# Patient Record
Sex: Female | Born: 1985 | Race: Black or African American | Hispanic: No | Marital: Single | Smoking: Current every day smoker
Health system: Southern US, Community
[De-identification: ages and names within clinical notes are randomized; demographics above are authoritative.]

## PROBLEM LIST (undated history)

## (undated) ENCOUNTER — Inpatient Hospital Stay (HOSPITAL_COMMUNITY): Payer: Self-pay

## (undated) DIAGNOSIS — B999 Unspecified infectious disease: Secondary | ICD-10-CM

## (undated) DIAGNOSIS — I10 Essential (primary) hypertension: Secondary | ICD-10-CM

## (undated) DIAGNOSIS — L309 Dermatitis, unspecified: Secondary | ICD-10-CM

## (undated) DIAGNOSIS — E611 Iron deficiency: Secondary | ICD-10-CM

## (undated) DIAGNOSIS — N83209 Unspecified ovarian cyst, unspecified side: Secondary | ICD-10-CM

## (undated) HISTORY — PX: INDUCED ABORTION: SHX677

## (undated) HISTORY — PX: WISDOM TOOTH EXTRACTION: SHX21

---
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
## 2006-02-04 ENCOUNTER — Ambulatory Visit (HOSPITAL_COMMUNITY): Admission: RE | Admit: 2006-02-04 | Discharge: 2006-02-04 | Payer: Self-pay | Admitting: Obstetrics

## 2006-03-04 ENCOUNTER — Ambulatory Visit (HOSPITAL_COMMUNITY): Admission: RE | Admit: 2006-03-04 | Discharge: 2006-03-04 | Payer: Self-pay | Admitting: Obstetrics

## 2006-03-19 ENCOUNTER — Inpatient Hospital Stay (HOSPITAL_COMMUNITY): Admission: AD | Admit: 2006-03-19 | Discharge: 2006-03-19 | Payer: Self-pay | Admitting: Obstetrics

## 2006-04-01 ENCOUNTER — Ambulatory Visit (HOSPITAL_COMMUNITY): Admission: RE | Admit: 2006-04-01 | Discharge: 2006-04-01 | Payer: Self-pay | Admitting: Obstetrics

## 2006-04-30 ENCOUNTER — Ambulatory Visit (HOSPITAL_COMMUNITY): Admission: RE | Admit: 2006-04-30 | Discharge: 2006-04-30 | Payer: Self-pay | Admitting: Obstetrics

## 2006-05-10 ENCOUNTER — Inpatient Hospital Stay (HOSPITAL_COMMUNITY): Admission: AD | Admit: 2006-05-10 | Discharge: 2006-05-10 | Payer: Self-pay | Admitting: Obstetrics

## 2006-05-11 ENCOUNTER — Inpatient Hospital Stay (HOSPITAL_COMMUNITY): Admission: AD | Admit: 2006-05-11 | Discharge: 2006-05-11 | Payer: Self-pay | Admitting: Family Medicine

## 2006-05-21 ENCOUNTER — Ambulatory Visit (HOSPITAL_COMMUNITY): Admission: RE | Admit: 2006-05-21 | Discharge: 2006-05-21 | Payer: Self-pay | Admitting: Obstetrics

## 2006-06-02 ENCOUNTER — Inpatient Hospital Stay (HOSPITAL_COMMUNITY): Admission: AD | Admit: 2006-06-02 | Discharge: 2006-06-02 | Payer: Self-pay | Admitting: Obstetrics

## 2006-06-03 ENCOUNTER — Inpatient Hospital Stay (HOSPITAL_COMMUNITY): Admission: AD | Admit: 2006-06-03 | Discharge: 2006-06-06 | Payer: Self-pay | Admitting: Obstetrics

## 2006-06-03 ENCOUNTER — Encounter (INDEPENDENT_AMBULATORY_CARE_PROVIDER_SITE_OTHER): Payer: Self-pay | Admitting: Obstetrics

## 2006-06-08 ENCOUNTER — Inpatient Hospital Stay (HOSPITAL_COMMUNITY): Admission: AD | Admit: 2006-06-08 | Discharge: 2006-06-10 | Payer: Self-pay | Admitting: Obstetrics

## 2006-10-12 ENCOUNTER — Emergency Department (HOSPITAL_COMMUNITY): Admission: EM | Admit: 2006-10-12 | Discharge: 2006-10-12 | Payer: Self-pay | Admitting: Emergency Medicine

## 2007-03-16 ENCOUNTER — Emergency Department (HOSPITAL_COMMUNITY): Admission: EM | Admit: 2007-03-16 | Discharge: 2007-03-16 | Payer: Self-pay | Admitting: Family Medicine

## 2007-11-03 ENCOUNTER — Emergency Department (HOSPITAL_COMMUNITY): Admission: EM | Admit: 2007-11-03 | Discharge: 2007-11-03 | Payer: Self-pay | Admitting: Emergency Medicine

## 2008-01-30 ENCOUNTER — Emergency Department (HOSPITAL_COMMUNITY): Admission: EM | Admit: 2008-01-30 | Discharge: 2008-01-30 | Payer: Self-pay | Admitting: Emergency Medicine

## 2008-12-16 ENCOUNTER — Inpatient Hospital Stay (HOSPITAL_COMMUNITY): Admission: AD | Admit: 2008-12-16 | Discharge: 2008-12-16 | Payer: Self-pay | Admitting: Obstetrics

## 2008-12-25 ENCOUNTER — Inpatient Hospital Stay (HOSPITAL_COMMUNITY): Admission: AD | Admit: 2008-12-25 | Discharge: 2008-12-25 | Payer: Self-pay | Admitting: Obstetrics

## 2009-02-01 ENCOUNTER — Emergency Department (HOSPITAL_COMMUNITY): Admission: EM | Admit: 2009-02-01 | Discharge: 2009-02-01 | Payer: Self-pay | Admitting: Emergency Medicine

## 2009-03-14 ENCOUNTER — Emergency Department (HOSPITAL_COMMUNITY): Admission: EM | Admit: 2009-03-14 | Discharge: 2009-03-14 | Payer: Self-pay | Admitting: Emergency Medicine

## 2009-03-15 ENCOUNTER — Inpatient Hospital Stay (HOSPITAL_COMMUNITY): Admission: AD | Admit: 2009-03-15 | Discharge: 2009-03-15 | Payer: Self-pay | Admitting: Obstetrics and Gynecology

## 2009-07-04 ENCOUNTER — Ambulatory Visit: Payer: Self-pay | Admitting: Gynecology

## 2009-07-04 ENCOUNTER — Inpatient Hospital Stay (HOSPITAL_COMMUNITY): Admission: AD | Admit: 2009-07-04 | Discharge: 2009-07-04 | Payer: Self-pay | Admitting: Obstetrics

## 2009-07-12 ENCOUNTER — Ambulatory Visit (HOSPITAL_COMMUNITY): Admission: RE | Admit: 2009-07-12 | Discharge: 2009-07-12 | Payer: Self-pay | Admitting: Obstetrics

## 2009-07-14 ENCOUNTER — Inpatient Hospital Stay (HOSPITAL_COMMUNITY): Admission: AD | Admit: 2009-07-14 | Discharge: 2009-07-14 | Payer: Self-pay | Admitting: Obstetrics

## 2009-07-14 ENCOUNTER — Ambulatory Visit: Payer: Self-pay | Admitting: Nurse Practitioner

## 2009-11-08 ENCOUNTER — Inpatient Hospital Stay (HOSPITAL_COMMUNITY): Admission: AD | Admit: 2009-11-08 | Discharge: 2009-11-08 | Payer: Self-pay | Admitting: Obstetrics

## 2009-11-08 ENCOUNTER — Ambulatory Visit: Payer: Self-pay | Admitting: Obstetrics and Gynecology

## 2010-01-27 ENCOUNTER — Encounter: Payer: Self-pay | Admitting: Obstetrics

## 2010-03-04 ENCOUNTER — Encounter (HOSPITAL_COMMUNITY): Payer: Self-pay | Admitting: Radiology

## 2010-03-04 ENCOUNTER — Inpatient Hospital Stay (HOSPITAL_COMMUNITY): Payer: Medicaid Other

## 2010-03-04 ENCOUNTER — Other Ambulatory Visit: Payer: Self-pay | Admitting: Family Medicine

## 2010-03-04 ENCOUNTER — Inpatient Hospital Stay (HOSPITAL_COMMUNITY)
Admission: AD | Admit: 2010-03-04 | Discharge: 2010-03-04 | Disposition: A | Discharge status: Home / Self Care | Payer: Medicaid Other | Source: Ambulatory Visit | Attending: Obstetrics | Admitting: Obstetrics

## 2010-03-04 DIAGNOSIS — O209 Hemorrhage in early pregnancy, unspecified: Secondary | ICD-10-CM | POA: Insufficient documentation

## 2010-03-04 DIAGNOSIS — R58 Hemorrhage, not elsewhere classified: Secondary | ICD-10-CM

## 2010-03-04 DIAGNOSIS — A499 Bacterial infection, unspecified: Secondary | ICD-10-CM | POA: Insufficient documentation

## 2010-03-04 DIAGNOSIS — B9689 Other specified bacterial agents as the cause of diseases classified elsewhere: Secondary | ICD-10-CM | POA: Insufficient documentation

## 2010-03-04 DIAGNOSIS — N390 Urinary tract infection, site not specified: Secondary | ICD-10-CM | POA: Insufficient documentation

## 2010-03-04 DIAGNOSIS — N760 Acute vaginitis: Secondary | ICD-10-CM | POA: Insufficient documentation

## 2010-03-04 DIAGNOSIS — R52 Pain, unspecified: Secondary | ICD-10-CM

## 2010-03-04 DIAGNOSIS — O2390 Unspecified genitourinary tract infection in pregnancy, unspecified trimester: Secondary | ICD-10-CM | POA: Insufficient documentation

## 2010-03-04 LAB — URINALYSIS, ROUTINE W REFLEX MICROSCOPIC
Bilirubin Urine: NEGATIVE
Hgb urine dipstick: NEGATIVE
Ketones, ur: NEGATIVE mg/dL
Nitrite: NEGATIVE
Protein, ur: NEGATIVE mg/dL
Specific Gravity, Urine: 1.015 (ref 1.005–1.030)
Urine Glucose, Fasting: NEGATIVE mg/dL
Urobilinogen, UA: 0.2 mg/dL (ref 0.0–1.0)
pH: 7 (ref 5.0–8.0)

## 2010-03-04 LAB — URINE MICROSCOPIC-ADD ON

## 2010-03-04 LAB — WET PREP, GENITAL
Trich, Wet Prep: NONE SEEN
Yeast Wet Prep HPF POC: NONE SEEN

## 2010-03-04 LAB — HCG, QUANTITATIVE, PREGNANCY: hCG, Beta Chain, Quant, S: 5788 m[IU]/mL — ABNORMAL HIGH (ref <5)

## 2010-03-04 LAB — CBC
HCT: 31.9 % — ABNORMAL LOW (ref 36.0–46.0)
Hemoglobin: 10.4 g/dL — ABNORMAL LOW (ref 12.0–15.0)
MCH: 30.2 pg (ref 26.0–34.0)
MCHC: 32.6 g/dL (ref 30.0–36.0)
MCV: 92.7 fL (ref 78.0–100.0)
Platelets: 248 10*3/uL (ref 150–400)
RBC: 3.44 MIL/uL — ABNORMAL LOW (ref 3.87–5.11)
RDW: 13.5 % (ref 11.5–15.5)
WBC: 5.4 10*3/uL (ref 4.0–10.5)

## 2010-03-04 LAB — POCT PREGNANCY, URINE: Preg Test, Ur: POSITIVE

## 2010-03-04 LAB — ABO/RH: ABO/RH(D): O POS

## 2010-03-05 LAB — URINE CULTURE
Colony Count: NO GROWTH
Culture  Setup Time: 201202280143
Culture: NO GROWTH

## 2010-03-05 LAB — GC/CHLAMYDIA PROBE AMP, GENITAL
Chlamydia, DNA Probe: POSITIVE — AB
GC Probe Amp, Genital: NEGATIVE

## 2010-03-11 ENCOUNTER — Inpatient Hospital Stay (HOSPITAL_COMMUNITY)
Admission: AD | Admit: 2010-03-11 | Discharge: 2010-03-11 | Disposition: A | Discharge status: Home / Self Care | Payer: Medicaid Other | Source: Ambulatory Visit | Attending: Obstetrics | Admitting: Obstetrics

## 2010-03-11 ENCOUNTER — Ambulatory Visit (HOSPITAL_COMMUNITY)
Admission: RE | Admit: 2010-03-11 | Discharge: 2010-03-11 | Disposition: A | Discharge status: Home / Self Care | Payer: Medicaid Other | Source: Ambulatory Visit | Attending: Family Medicine | Admitting: Family Medicine

## 2010-03-11 ENCOUNTER — Inpatient Hospital Stay (HOSPITAL_COMMUNITY): Admit: 2010-03-11 | Payer: Self-pay

## 2010-03-11 ENCOUNTER — Ambulatory Visit (HOSPITAL_COMMUNITY): Payer: Self-pay

## 2010-03-11 DIAGNOSIS — R58 Hemorrhage, not elsewhere classified: Secondary | ICD-10-CM

## 2010-03-11 DIAGNOSIS — Z8751 Personal history of pre-term labor: Secondary | ICD-10-CM | POA: Insufficient documentation

## 2010-03-11 DIAGNOSIS — R109 Unspecified abdominal pain: Secondary | ICD-10-CM | POA: Insufficient documentation

## 2010-03-11 DIAGNOSIS — O99891 Other specified diseases and conditions complicating pregnancy: Secondary | ICD-10-CM

## 2010-03-11 DIAGNOSIS — O9989 Other specified diseases and conditions complicating pregnancy, childbirth and the puerperium: Secondary | ICD-10-CM

## 2010-03-11 DIAGNOSIS — Z3689 Encounter for other specified antenatal screening: Secondary | ICD-10-CM | POA: Insufficient documentation

## 2010-03-19 LAB — WET PREP, GENITAL
Clue Cells Wet Prep HPF POC: NONE SEEN
Trich, Wet Prep: NONE SEEN
Yeast Wet Prep HPF POC: NONE SEEN

## 2010-03-19 LAB — URINALYSIS, ROUTINE W REFLEX MICROSCOPIC
Bilirubin Urine: NEGATIVE
Glucose, UA: NEGATIVE mg/dL
Ketones, ur: NEGATIVE mg/dL
Leukocytes, UA: NEGATIVE
Nitrite: NEGATIVE
Protein, ur: NEGATIVE mg/dL
Specific Gravity, Urine: 1.02 (ref 1.005–1.030)
Urobilinogen, UA: 0.2 mg/dL (ref 0.0–1.0)
pH: 7 (ref 5.0–8.0)

## 2010-03-19 LAB — URINE MICROSCOPIC-ADD ON

## 2010-03-19 LAB — POCT PREGNANCY, URINE: Preg Test, Ur: NEGATIVE

## 2010-03-19 LAB — GC/CHLAMYDIA PROBE AMP, GENITAL
Chlamydia, DNA Probe: NEGATIVE
GC Probe Amp, Genital: NEGATIVE

## 2010-03-24 LAB — URINALYSIS, ROUTINE W REFLEX MICROSCOPIC
Bilirubin Urine: NEGATIVE
Glucose, UA: NEGATIVE mg/dL
Hgb urine dipstick: NEGATIVE
Ketones, ur: 15 mg/dL — AB
Nitrite: NEGATIVE
Protein, ur: NEGATIVE mg/dL
Specific Gravity, Urine: 1.025 (ref 1.005–1.030)
Urobilinogen, UA: 0.2 mg/dL (ref 0.0–1.0)
pH: 6.5 (ref 5.0–8.0)

## 2010-03-24 LAB — CBC
HCT: 33.2 % — ABNORMAL LOW (ref 36.0–46.0)
HCT: 30.6 % — ABNORMAL LOW (ref 36.0–46.0)
Hemoglobin: 11.2 g/dL — ABNORMAL LOW (ref 12.0–15.0)
Hemoglobin: 10.4 g/dL — ABNORMAL LOW (ref 12.0–15.0)
MCH: 31.4 pg (ref 26.0–34.0)
MCH: 31.2 pg (ref 26.0–34.0)
MCHC: 33.8 g/dL (ref 30.0–36.0)
MCHC: 33.9 g/dL (ref 30.0–36.0)
MCV: 92.7 fL (ref 78.0–100.0)
MCV: 92.2 fL (ref 78.0–100.0)
Platelets: 241 10*3/uL (ref 150–400)
Platelets: 213 10*3/uL (ref 150–400)
RBC: 3.58 MIL/uL — ABNORMAL LOW (ref 3.87–5.11)
RBC: 3.32 MIL/uL — ABNORMAL LOW (ref 3.87–5.11)
RDW: 14.5 % (ref 11.5–15.5)
RDW: 14.6 % (ref 11.5–15.5)
WBC: 7.9 10*3/uL (ref 4.0–10.5)
WBC: 6.9 10*3/uL (ref 4.0–10.5)

## 2010-03-24 LAB — WET PREP, GENITAL
Clue Cells Wet Prep HPF POC: NONE SEEN
Trich, Wet Prep: NONE SEEN
Trich, Wet Prep: NONE SEEN
Yeast Wet Prep HPF POC: NONE SEEN
Yeast Wet Prep HPF POC: NONE SEEN

## 2010-03-24 LAB — GC/CHLAMYDIA PROBE AMP, GENITAL
Chlamydia, DNA Probe: NEGATIVE
Chlamydia, DNA Probe: NEGATIVE
GC Probe Amp, Genital: NEGATIVE
GC Probe Amp, Genital: NEGATIVE

## 2010-03-24 LAB — POCT PREGNANCY, URINE: Preg Test, Ur: POSITIVE

## 2010-03-24 LAB — HCG, QUANTITATIVE, PREGNANCY: hCG, Beta Chain, Quant, S: 732 m[IU]/mL — ABNORMAL HIGH (ref <5)

## 2010-03-25 ENCOUNTER — Inpatient Hospital Stay (HOSPITAL_COMMUNITY)
Admission: AD | Admit: 2010-03-25 | Discharge: 2010-03-25 | Disposition: A | Discharge status: Home / Self Care | Payer: Medicaid Other | Source: Ambulatory Visit | Attending: Obstetrics | Admitting: Obstetrics

## 2010-03-25 DIAGNOSIS — R55 Syncope and collapse: Secondary | ICD-10-CM | POA: Insufficient documentation

## 2010-03-25 LAB — CBC
HCT: 30.8 % — ABNORMAL LOW (ref 36.0–46.0)
Hemoglobin: 10.1 g/dL — ABNORMAL LOW (ref 12.0–15.0)
MCH: 30.6 pg (ref 26.0–34.0)
MCHC: 32.8 g/dL (ref 30.0–36.0)
MCV: 93.3 fL (ref 78.0–100.0)
Platelets: 218 10*3/uL (ref 150–400)
RBC: 3.3 MIL/uL — ABNORMAL LOW (ref 3.87–5.11)
RDW: 13.6 % (ref 11.5–15.5)
WBC: 5.8 10*3/uL (ref 4.0–10.5)

## 2010-03-25 LAB — SAMPLE TO BLOOD BANK

## 2010-03-25 LAB — GLUCOSE, CAPILLARY: Glucose-Capillary: 128 mg/dL — ABNORMAL HIGH (ref 70–99)

## 2010-03-26 LAB — GC/CHLAMYDIA PROBE AMP, GENITAL
Chlamydia, DNA Probe: NEGATIVE
GC Probe Amp, Genital: NEGATIVE

## 2010-04-01 LAB — URINALYSIS, ROUTINE W REFLEX MICROSCOPIC
Bilirubin Urine: NEGATIVE
Glucose, UA: NEGATIVE mg/dL
Hgb urine dipstick: NEGATIVE
Ketones, ur: NEGATIVE mg/dL
Nitrite: NEGATIVE
Protein, ur: NEGATIVE mg/dL
Specific Gravity, Urine: 1.02 (ref 1.005–1.030)
Urobilinogen, UA: 0.2 mg/dL (ref 0.0–1.0)
pH: 7 (ref 5.0–8.0)

## 2010-04-01 LAB — HERPES SIMPLEX VIRUS CULTURE: Culture: DETECTED

## 2010-04-01 LAB — WET PREP, GENITAL
Clue Cells Wet Prep HPF POC: NONE SEEN
Trich, Wet Prep: NONE SEEN
Yeast Wet Prep HPF POC: NONE SEEN

## 2010-04-01 LAB — GC/CHLAMYDIA PROBE AMP, GENITAL
Chlamydia, DNA Probe: NEGATIVE
GC Probe Amp, Genital: NEGATIVE

## 2010-04-01 LAB — POCT PREGNANCY, URINE: Preg Test, Ur: NEGATIVE

## 2010-04-08 LAB — CBC
HCT: 33.9 % — ABNORMAL LOW (ref 36.0–46.0)
Hemoglobin: 11.4 g/dL — ABNORMAL LOW (ref 12.0–15.0)
MCHC: 33.7 g/dL (ref 30.0–36.0)
MCV: 93.4 fL (ref 78.0–100.0)
Platelets: 316 10*3/uL (ref 150–400)
RBC: 3.63 MIL/uL — ABNORMAL LOW (ref 3.87–5.11)
RDW: 13.3 % (ref 11.5–15.5)
WBC: 4.8 10*3/uL (ref 4.0–10.5)

## 2010-04-08 LAB — POCT PREGNANCY, URINE: Preg Test, Ur: NEGATIVE

## 2010-04-09 LAB — URINALYSIS, ROUTINE W REFLEX MICROSCOPIC
Bilirubin Urine: NEGATIVE
Glucose, UA: NEGATIVE mg/dL
Ketones, ur: NEGATIVE mg/dL
Leukocytes, UA: NEGATIVE
Nitrite: NEGATIVE
Protein, ur: NEGATIVE mg/dL
Specific Gravity, Urine: 1.02 (ref 1.005–1.030)
Urobilinogen, UA: 0.2 mg/dL (ref 0.0–1.0)
pH: 7.5 (ref 5.0–8.0)

## 2010-04-09 LAB — GC/CHLAMYDIA PROBE AMP, GENITAL
Chlamydia, DNA Probe: NEGATIVE
GC Probe Amp, Genital: NEGATIVE

## 2010-04-09 LAB — URINE MICROSCOPIC-ADD ON

## 2010-04-09 LAB — WET PREP, GENITAL
Trich, Wet Prep: NONE SEEN
Yeast Wet Prep HPF POC: NONE SEEN

## 2010-04-09 LAB — POCT PREGNANCY, URINE: Preg Test, Ur: NEGATIVE

## 2010-05-21 NOTE — Op Note (Signed)
NAMESHAILY, LIBRIZZI NO.:  [id number]   MEDICAL RECORD NO.:  [id number]          PATIENT TYPE:  INP   LOCATION:  9320                          FACILITY:  [facility]   PHYSICIAN:  Kathreen Cosier, M.D.DATE OF BIRTH:  1985/06/13   DATE OF PROCEDURE:  06/03/2006  DATE OF DISCHARGE:                               OPERATIVE REPORT   PREOPERATIVE DIAGNOSIS:  Twin gestation at 35 weeks in labor with breech  presentation.   SURGEON:  Kathreen Cosier, M.D.   FIRST ASSISTANT:  Charles A. Clearance Coots, M.D.   PROCEDURE:  The patient was placed on the operating table in a supine  position.  After the spinal was administered by Dr. Harvest Forest, the abdomen  was prepped and draped.  The bladder was emptied with a Foley catheter.  A transverse suprapubic incision was made through the old scar and  carried down to the rectus fascia.  The fascia was incised the length of  the incision.  The rectus muscles were retracted laterally.  The  peritoneum was incised longitudinally.  A transverse incision was made  in the visceroperitoneum above the bladder, the bladder mobilized  inferiorly.  A transverse lower uterine incision was made.  The  membranes were ruptured on twin A which the fluid was clear.  This was a  female, delivered double footling breech at 1743, the baby weighed 6  pounds 10 ounces, Apgars 7 and 8.  Twin B membranes were ruptured, the  fluid was clear, delivered as a vertex, female at 1745, Apgars 7 and 8,  weighing 6 pounds.  Two placentae were removed manually and sent to  labor and delivery.  The uterine cavity was cleaned with dry laps.  The  uterine incision was closed in one layer with continuous suture of #1  chromic.  Hemostasis was satisfactory. The bladder flap was reattached  with 2-0 chromic.  The uterus was well contracted.  The tubes and  ovaries were normal.  The abdomen was closed in layers, peritoneum with  continuous suture of 0 chromic, the fascia with  continuous suture of 0  Dexon, the skin was closed with subcuticular stitch of 4-0 plain.  Blood  loss was 700 mL.  The patient tolerated the procedure well and was taken  to the recovery room in good condition.           ______________________________  Kathreen Cosier, M.D.     BAM/MEDQ  D:  06/03/2006  T:  06/03/2006  Job:  [id number]

## 2010-05-24 NOTE — Discharge Summary (Signed)
NAMECHONTE, RICKE NO.:  [id number]   MEDICAL RECORD NO.:  [id number]          PATIENT TYPE:  INP   LOCATION:  9320                          [facility]ACILITY:  [facility]   PHYSICIAN:  Kathreen Cosier, M.D.DATE OF BIRTH:  October 08, 1985   DATE OF ADMISSION:  06/08/2006  DATE OF DISCHARGE:  06/10/2006                               DISCHARGE SUMMARY   Patient is a 25 year old female who had a C-section on May 28, when she  was a gravida 3, para 4, and was readmitted with diastolic blood  pressures up to 104, negative protein, uric acid 5+.  She had a headache  on admission with a diagnosis of pregnancy-induced hypertension,  delayed.  She was started on magnesium sulfate.  Her platelets were 317,  hemoglobin 9, white count 7, uric acid 5.6, urine protein negative.   On June 3, the magnesium sulfate was discontinued, and she was started  on labetalol 200 mg p.o. b.i.d.   By June 4, her blood pressures were normal.  She was discharged home on  labetalol 200 mg p.o. b.i.d.  To see me in one week for blood pressure  check.   DISCHARGE DIAGNOSIS:  Delayed pregnancy-induced hypertension.           ______________________________  Kathreen Cosier, M.D.     BAM/MEDQ  D:  07/22/2006  T:  07/22/2006  Job:  [id number]

## 2010-05-24 NOTE — Op Note (Signed)
NAME:  Shirley [name], Shirley [name]                          ACCOUNT NO.:  [id number]   MEDICAL RECORD NO.:  [id number]                   PATIENT TYPE:  INP   LOCATION:  9130                                 [facility]ACILITY:  [facility]   PHYSICIAN:  Freddy Finner, M.D.                DATE OF BIRTH:  11/24/1985   DATE OF PROCEDURE:  02/25/2002  DATE OF DISCHARGE:                                 OPERATIVE REPORT   PREOPERATIVE DIAGNOSES:  1. Intrauterine pregnancy at 37-1/[redacted] weeks gestation.  2. Nonreassuring fetal heart tracing.   POSTOPERATIVE DIAGNOSIS:  Intrauterine pregnancy at 37-1/[redacted] weeks gestation  status post deliver of a viable female infant, Apgars 8 and 9 assigned by  NICU, 7 pounds 8 ounces, arterial cord pH 7.29.   SURGEON:  Freddy Finner, M.D.   PROCEDURE:  Primary low transverse cervical cesarean section.   ANESTHESIA:  Epidural.   ESTIMATED BLOOD LOSS:  600 ml.   BRIEF HISTORY:  The patient is a 25 year old who presented in early labor.  Rupture of membranes was accomplished, and the patient was augmented with  intravenous Pitocin and progressed to approximately 3 cm dilatation.  Pitocin produced Nonreassuring fetal heart tracing with variables and  occasional light variables.  It was elected at this point to proceed with  cesarean delivery, and she was brought to the operating room where epidural  was dosed for surgery.   DESCRIPTION OF PROCEDURE:  The abdomen was prepped and draped in the usual  fashion.  Foley catheter was indwelling.  Low abdominal transverse incision  was made and carried sharply down to fascia.  Fascia was entered sharply and  extended to the extent of skin incision.  Rectus sheath was developed  superiorly and inferiorly with blunt and sharp dissection.  Rectus muscles  midline.  Perineum was entered sharply and extended bluntly to the extent of  skin incision.  Transverse incision was made in the peritoneum overlying  lower uterine segment, dissected off lower  segment.  Uterus was entered  transversely with a scalpel and extended bluntly in transverse direction. A  viable female infant was then delivered without difficulty.  There was no  apparent etiology for the nonreassuring tracing, specifically no nuchal  cord, no evidence of knot in the cord or any placental abnormality. Apgars  were assigned by NICU.  The placenta and other products of conception were  removed from the uterus.  The uterus was delivered through the abdominal  incision.  Tubes and ovaries were normal as was the uterus.  The incision  was closed in the lower segment in running locking 0 Monocryl.  Hemostasis  was adequate.  Bladder flap was approximated with interrupted single suture  of 0 Monocryl.  Uterus was placed back within the abdominal cavity.  Irrigation was carried out.  Hemostasis again was noted to be adequate.  Peritoneum was then closed with 0  Monocryl in  running fashion which was then also used to close the rectus  muscles in midline.  The fascia was closed with running 0 PDS. Skin was  closed with wide skin staples and Steri-Strips.  The patient tolerated the  operative procedure well and was taken to the recovery room in good  condition.                                               Freddy Finner, M.D.    WRN/MEDQ  D:  02/25/2002  T:  02/26/2002  Job:  [id number]

## 2010-05-24 NOTE — Discharge Summary (Signed)
NAMENITHYA, MERIWEATHER NO.:  [id number]   MEDICAL RECORD NO.:  [id number]          PATIENT TYPE:  INP   LOCATION:  9320                          [facility]ILITY[facility]   PHYSICIAN:  Kathreen Cosier, M.D.DATE OF BIRTH:  1985-11-25   DATE OF ADMISSION:  06/08/2006  DATE OF DISCHARGE:  06/10/2006                               DISCHARGE SUMMARY   The patient is a 25 year old gravida 3, para 2-0-2-1, previous C-section  and she was pregnant with twins. Her EDC was July 06, 2006. Ultrasound  on May 22, 2006 showed twin A breech 5 pounds 9 ounces.  Twin B 5 pounds  8 ounces breech and the patient was now admitted in labor.   HOSPITAL COURSE:  She was given magnesium sulfate to stop her  contractions, however contractions did not stop and she underwent a  repeat low transverse cesarean section.  Twin A double footling breech  female, Apgar 7 and 8, weighing 6 pounds 10 ounces.  Twin B vertex, 6  pounds, Apgar 7 and 8.  Placenta was sent to labor and delivery.  Postoperatively she did well.  Her hemoglobin was 10. She was discharged  on the third postoperative day ambulatory, regular diet and Tylenol No.3  for pain, to see me in 6 weeks.   DISCHARGE DIAGNOSIS:  Status post repeat low transverse cesarean section  with twin gestation 35+ weeks.           ______________________________  Kathreen Cosier, M.D.     BAM/MEDQ  D:  07/15/2006  T:  07/15/2006  Job:  [id number]

## 2010-05-24 NOTE — Discharge Summary (Signed)
NAME:  Shirley [name], Shirley [name]                          ACCOUNT NO.:  [id number]   MEDICAL RECORD NO.:  [id number]                   PATIENT TYPE:  INP   LOCATION:  9130                                 [facility]ILITY:  [facility]   PHYSICIAN:  Tracie Harrier, M.D.              DATE OF BIRTH:  April 23, 1985   DATE OF ADMISSION:  02/24/2002  DATE OF DISCHARGE:  02/28/2002                                 DISCHARGE SUMMARY   ADMITTING DIAGNOSES:  1. Intrauterine pregnancy at 37-and-a-half weeks estimated gestational age.  2. Labor.   DISCHARGE DIAGNOSES:  1. Status post low transverse cesarean section secondary to nonreassuring     fetal heart tones.  2. Viable female infant.   PROCEDURE:  Primary low transverse cesarean section.   REASON FOR ADMISSION:  Please see written H&P.   HOSPITAL COURSE:  The patient is a 25 year old primigravida who presented to  [facility] in early labor.  Blood pressure was noted to be  elevated at 149/98 to 145/96.  The patient denied headache, visual  disturbance, or right upper quadrant pain.  PIH labs were within normal  limits.  Artificial rupture of membranes was performed revealing clear  fluid.  Fetal heart tones were reassuring.  The patient's labor was  augmented with Pitocin.  Epidural was placed for patient comfort.  The  patient progressed to 3 cm dilated.  Fetal heart tones became nonreassuring  with variable decelerations noted.  Decision was made to proceed with a low  transverse cesarean delivery.  The patient was taken to the operating room  where epidural was dosed adequately to surgical level.  A low transverse  incision was made with the delivery of a viable female infant weighing 7  pounds 8 ounces, Apgars of 8 at one minute and 9 at five minutes.  Arterial  cord pH was 7.29.  The patient tolerated the procedure well and was taken to  the recovery room in stable condition.   On postoperative day #1 the patient had good return of bowel  function.  Abdomen was soft.  Fundus was firm and nontender.  Scant drainage was noted  on the abdominal dressing.  Blood pressure was 120/72.  Labs revealed  hemoglobin of 9.6; platelet count of 220,000; wbc count of 11.3.  On  postoperative day #2 the patient was febrile.  She was tolerating a regular  diet without complaints of nausea and vomiting.  Abdominal dressing was  removed revealing an incision that was clean, dry, and intact.  On  postoperative day  #3 abdomen was soft, fundus was firm and nontender.  Incision was clean,  dry, and intact.  Staples were removed.  A small amount of bleeding was  noted at the left margin of the incision post staple removal.  Steri-Strips  were reinforced.  The patient was ambulating well and was discharged home.   CONDITION ON DISCHARGE:  Good.  DIET:  Regular as tolerated.   ACTIVITY:  No heavy lifting, no driving x2 weeks, no vaginal entry.   FOLLOW-UP:  The patient is follow up in the office in one to two weeks for  an incision check.  She is to call for temperature greater than 100 degrees,  persistent nausea and vomiting, heaving vaginal bleeding, and/or redness or  drainage from the incisional site.   DISCHARGE MEDICATIONS:  1. Percocet 5/325 #30 one p.o. q.4-6h. p.r.n. pain.  2. Motrin 600 mg q.6h. p.r.n.  3. Prenatal vitamins one p.o. daily.  4. Colace one p.o. daily p.r.n.     Julio Sicks, N.P.                        Tracie Harrier, M.D.    CC/MEDQ  D:  03/22/2002  T:  03/22/2002  Job:  [id number]

## 2010-08-08 ENCOUNTER — Other Ambulatory Visit: Payer: Self-pay | Admitting: Obstetrics

## 2010-09-30 LAB — POCT H PYLORI SCREEN: H. PYLORI SCREEN, POC: NEGATIVE

## 2010-10-08 LAB — URINALYSIS, ROUTINE W REFLEX MICROSCOPIC
Bilirubin Urine: NEGATIVE
Glucose, UA: NEGATIVE
Hgb urine dipstick: NEGATIVE
Ketones, ur: NEGATIVE
Nitrite: NEGATIVE
Protein, ur: NEGATIVE
Specific Gravity, Urine: 1.026
Urobilinogen, UA: 0.2
pH: 6.5

## 2010-10-08 LAB — POCT PREGNANCY, URINE: Preg Test, Ur: NEGATIVE

## 2010-10-24 LAB — CBC
HCT: 24.7 — ABNORMAL LOW
HCT: 27 — ABNORMAL LOW
Hemoglobin: 8.2 — ABNORMAL LOW
Hemoglobin: 9 — ABNORMAL LOW
MCHC: 33.2
MCHC: 33.4
MCV: 82
MCV: 82.1
Platelets: 295
Platelets: 317
RBC: 3.01 — ABNORMAL LOW
RBC: 3.3 — ABNORMAL LOW
RDW: 14.4 — ABNORMAL HIGH
RDW: 14.4 — ABNORMAL HIGH
WBC: 7
WBC: 7.4

## 2010-10-24 LAB — URINALYSIS, ROUTINE W REFLEX MICROSCOPIC
Bilirubin Urine: NEGATIVE
Glucose, UA: NEGATIVE
Hgb urine dipstick: NEGATIVE
Ketones, ur: NEGATIVE
Nitrite: NEGATIVE
Protein, ur: NEGATIVE
Specific Gravity, Urine: 1.01
Urobilinogen, UA: 0.2
pH: 7

## 2010-10-24 LAB — COMPREHENSIVE METABOLIC PANEL
ALT: 25
ALT: 28
AST: 30
AST: 30
Albumin: 2.2 — ABNORMAL LOW
Albumin: 2.2 — ABNORMAL LOW
Alkaline Phosphatase: 106
Alkaline Phosphatase: 115
BUN: 5 — ABNORMAL LOW
BUN: 8
CO2: 29
CO2: 29
Calcium: 7.7 — ABNORMAL LOW
Calcium: 8.6
Chloride: 103
Chloride: 104
Creatinine, Ser: 0.53
Creatinine, Ser: 0.59
GFR calc Af Amer: >60
GFR calc Af Amer: >60
GFR calc non Af Amer: >60
GFR calc non Af Amer: >60
Glucose, Bld: 100 — ABNORMAL HIGH
Glucose, Bld: 89
Potassium: 3.8
Potassium: 4
Sodium: 138
Sodium: 139
Total Bilirubin: 0.6
Total Bilirubin: 0.6
Total Protein: 5.3 — ABNORMAL LOW
Total Protein: 5.3 — ABNORMAL LOW

## 2010-10-24 LAB — LACTATE DEHYDROGENASE
LDH: 215
LDH: 227

## 2010-10-24 LAB — URIC ACID
Uric Acid, Serum: 5.6
Uric Acid, Serum: 5.7

## 2010-10-24 LAB — MAGNESIUM: Magnesium: 4.7 — ABNORMAL HIGH

## 2011-07-13 ENCOUNTER — Encounter (HOSPITAL_COMMUNITY): Payer: Self-pay | Admitting: *Deleted

## 2011-07-13 ENCOUNTER — Inpatient Hospital Stay (HOSPITAL_COMMUNITY)
Admission: AD | Admit: 2011-07-13 | Discharge: 2011-07-13 | Disposition: A | Discharge status: Home / Self Care | Payer: Medicaid Other | Source: Ambulatory Visit | Attending: Obstetrics | Admitting: Obstetrics

## 2011-07-13 DIAGNOSIS — R109 Unspecified abdominal pain: Secondary | ICD-10-CM | POA: Insufficient documentation

## 2011-07-13 DIAGNOSIS — N760 Acute vaginitis: Secondary | ICD-10-CM

## 2011-07-13 DIAGNOSIS — B9689 Other specified bacterial agents as the cause of diseases classified elsewhere: Secondary | ICD-10-CM

## 2011-07-13 DIAGNOSIS — N949 Unspecified condition associated with female genital organs and menstrual cycle: Secondary | ICD-10-CM | POA: Insufficient documentation

## 2011-07-13 DIAGNOSIS — A499 Bacterial infection, unspecified: Secondary | ICD-10-CM

## 2011-07-13 LAB — URINALYSIS, ROUTINE W REFLEX MICROSCOPIC
Bilirubin Urine: NEGATIVE
Glucose, UA: NEGATIVE mg/dL
Hgb urine dipstick: NEGATIVE
Ketones, ur: NEGATIVE mg/dL
Leukocytes, UA: NEGATIVE
Nitrite: NEGATIVE
Protein, ur: NEGATIVE mg/dL
Specific Gravity, Urine: 1.02 (ref 1.005–1.030)
Urobilinogen, UA: 1 mg/dL (ref 0.0–1.0)
pH: 6 (ref 5.0–8.0)

## 2011-07-13 LAB — POCT PREGNANCY, URINE: Preg Test, Ur: NEGATIVE

## 2011-07-13 LAB — WET PREP, GENITAL
Trich, Wet Prep: NONE SEEN
Yeast Wet Prep HPF POC: NONE SEEN

## 2011-07-13 MED ORDER — METRONIDAZOLE 500 MG PO TABS: 500.0000 mg | ORAL_TABLET | Freq: Two times a day (BID) | ORAL | Status: AC

## 2011-07-13 NOTE — MAU Provider Note (Signed)
History     CSN: [id number]  Arrival date and time: 07/13/11 2139   First Provider Initiated Contact with Patient 07/13/11 2233      Chief Complaint  Patient presents with  . Abdominal Cramping   HPI This is a 26 year old who presents the MAU with one week of intermittent, nonradiating pelvic cramping pain.  She rates the pain as mild. She has vaginal discharge was also started approximately one week. She denies sexual partners. Her last period was last week and was 3 days in length. Her normal length period is 4 weeks.  OB History    Grav Para Term Preterm Abortions TAB SAB Ect Mult Living   5 3 1 2 2 2   1 4       No past medical history on file.  Past Surgical History  Procedure Date  . Cesarean section   . Wisdom tooth extraction     History reviewed. No pertinent family history.  History  Substance Use Topics  . Smoking status: Never Smoker   . Smokeless tobacco: Not on file  . Alcohol Use: No    Allergies: No Known Allergies  No prescriptions prior to admission    ROS Physical Exam   Blood pressure 154/94, pulse 78, temperature 99.4 F (37.4 C), temperature source Oral, resp. rate 18, height 5' (1.524 m), weight 67.586 kg (149 lb), last menstrual period 06/29/2011, SpO2 100.00%, unknown if currently breastfeeding.  Physical Exam  Constitutional: She is oriented to person, place, and time. She appears well-developed and well-nourished.  GI: Soft. She exhibits no distension and no mass. There is tenderness (pelvic). There is no rebound and no guarding.  Genitourinary: Vagina normal.       Normal external female genitalia. White vaginal discharge. Cervix normal in appearance. No cervical motion tenderness. No masses in the adnexa for  Neurological: She is alert and oriented to person, place, and time.  Psychiatric: She has a normal mood and affect. Her behavior is normal. Judgment and thought content normal.   Results for orders placed during the hospital  encounter of 07/13/11 (from the past 24 hour(s))  URINALYSIS, ROUTINE W REFLEX MICROSCOPIC     Status: Normal   Collection Time   07/13/11  9:53 PM      Component Value Range   Color, Urine YELLOW  YELLOW   APPearance CLEAR  CLEAR   Specific Gravity, Urine 1.020  1.005 - 1.030   pH 6.0  5.0 - 8.0   Glucose, UA NEGATIVE  NEGATIVE mg/dL   Hgb urine dipstick NEGATIVE  NEGATIVE   Bilirubin Urine NEGATIVE  NEGATIVE   Ketones, ur NEGATIVE  NEGATIVE mg/dL   Protein, ur NEGATIVE  NEGATIVE mg/dL   Urobilinogen, UA 1.0  0.0 - 1.0 mg/dL   Nitrite NEGATIVE  NEGATIVE   Leukocytes, UA NEGATIVE  NEGATIVE  POCT PREGNANCY, URINE     Status: Normal   Collection Time   07/13/11 10:00 PM      Component Value Range   Preg Test, Ur NEGATIVE  NEGATIVE  WET PREP, GENITAL     Status: Abnormal   Collection Time   07/13/11 10:33 PM      Component Value Range   Yeast Wet Prep HPF POC NONE SEEN  NONE SEEN   Trich, Wet Prep NONE SEEN  NONE SEEN   Clue Cells Wet Prep HPF POC FEW (*) NONE SEEN   WBC, Wet Prep HPF POC FEW (*) NONE SEEN     MAU Course  Procedures  MDM Negative urine pregnancy test. Negative urine. Wet prep positive for clue cells  Assessment and Plan  1.  BV  Flagyl prescribed.  Patient to follow up if worsens.  Katori Wirsing JEHIEL 07/13/2011, 10:52 PM

## 2011-07-13 NOTE — MAU Note (Signed)
Pt reports "i have been feeling faint all day and my period was really short". LMP 06/29/2011, states it only lasted 3 days. Also reports abd cramping. Unsure if pregnant.

## 2011-07-14 LAB — GC/CHLAMYDIA PROBE AMP, GENITAL
Chlamydia, DNA Probe: NEGATIVE
GC Probe Amp, Genital: NEGATIVE

## 2011-08-30 ENCOUNTER — Encounter (HOSPITAL_BASED_OUTPATIENT_CLINIC_OR_DEPARTMENT_OTHER): Payer: Self-pay | Admitting: *Deleted

## 2011-08-30 DIAGNOSIS — R109 Unspecified abdominal pain: Secondary | ICD-10-CM | POA: Insufficient documentation

## 2011-08-30 LAB — PREGNANCY, URINE: Preg Test, Ur: POSITIVE — AB
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
## 2011-08-31 ENCOUNTER — Emergency Department (HOSPITAL_BASED_OUTPATIENT_CLINIC_OR_DEPARTMENT_OTHER)
Admission: EM | Admit: 2011-08-31 | Discharge: 2011-08-31 | Disposition: A | Discharge status: Home / Self Care | Payer: Medicaid Other | Attending: Emergency Medicine | Admitting: Emergency Medicine

## 2011-08-31 LAB — URINE MICROSCOPIC-ADD ON

## 2011-08-31 LAB — URINALYSIS, ROUTINE W REFLEX MICROSCOPIC
Bilirubin Urine: NEGATIVE
Glucose, UA: NEGATIVE mg/dL
Hgb urine dipstick: NEGATIVE
Ketones, ur: NEGATIVE mg/dL
Nitrite: NEGATIVE
Protein, ur: NEGATIVE mg/dL
Specific Gravity, Urine: 1.033 — ABNORMAL HIGH (ref 1.005–1.030)
Urobilinogen, UA: 1 mg/dL (ref 0.0–1.0)
pH: 6 (ref 5.0–8.0)

## 2011-08-31 NOTE — ED Notes (Signed)
CAlled in lobby. No answer.

## 2011-09-04 ENCOUNTER — Encounter (HOSPITAL_COMMUNITY): Payer: Self-pay

## 2011-09-04 ENCOUNTER — Inpatient Hospital Stay (HOSPITAL_COMMUNITY)
Admission: AD | Admit: 2011-09-04 | Discharge: 2011-09-04 | Disposition: A | Discharge status: Home / Self Care | Payer: Medicaid Other | Source: Ambulatory Visit | Attending: Obstetrics and Gynecology | Admitting: Obstetrics and Gynecology

## 2011-09-04 ENCOUNTER — Inpatient Hospital Stay (HOSPITAL_COMMUNITY): Payer: Medicaid Other

## 2011-09-04 DIAGNOSIS — O2390 Unspecified genitourinary tract infection in pregnancy, unspecified trimester: Secondary | ICD-10-CM | POA: Insufficient documentation

## 2011-09-04 DIAGNOSIS — B3731 Acute candidiasis of vulva and vagina: Secondary | ICD-10-CM

## 2011-09-04 DIAGNOSIS — N760 Acute vaginitis: Secondary | ICD-10-CM

## 2011-09-04 DIAGNOSIS — B373 Candidiasis of vulva and vagina: Secondary | ICD-10-CM | POA: Insufficient documentation

## 2011-09-04 DIAGNOSIS — N949 Unspecified condition associated with female genital organs and menstrual cycle: Secondary | ICD-10-CM | POA: Insufficient documentation

## 2011-09-04 DIAGNOSIS — B9689 Other specified bacterial agents as the cause of diseases classified elsewhere: Secondary | ICD-10-CM | POA: Insufficient documentation

## 2011-09-04 DIAGNOSIS — A499 Bacterial infection, unspecified: Secondary | ICD-10-CM | POA: Insufficient documentation

## 2011-09-04 DIAGNOSIS — R102 Pelvic and perineal pain unspecified side: Secondary | ICD-10-CM

## 2011-09-04 DIAGNOSIS — O26899 Other specified pregnancy related conditions, unspecified trimester: Secondary | ICD-10-CM

## 2011-09-04 DIAGNOSIS — R109 Unspecified abdominal pain: Secondary | ICD-10-CM | POA: Insufficient documentation

## 2011-09-04 HISTORY — DX: Dermatitis, unspecified: L30.9

## 2011-09-04 LAB — CBC
HCT: 32.6 % — ABNORMAL LOW (ref 36.0–46.0)
Hemoglobin: 10.8 g/dL — ABNORMAL LOW (ref 12.0–15.0)
MCH: 28.8 pg (ref 26.0–34.0)
MCHC: 33.1 g/dL (ref 30.0–36.0)
MCV: 86.9 fL (ref 78.0–100.0)
Platelets: 260 10*3/uL (ref 150–400)
RBC: 3.75 MIL/uL — ABNORMAL LOW (ref 3.87–5.11)
RDW: 15.8 % — ABNORMAL HIGH (ref 11.5–15.5)
WBC: 5 10*3/uL (ref 4.0–10.5)

## 2011-09-04 LAB — WET PREP, GENITAL: Trich, Wet Prep: NONE SEEN

## 2011-09-04 LAB — URINALYSIS, ROUTINE W REFLEX MICROSCOPIC
Bilirubin Urine: NEGATIVE
Glucose, UA: NEGATIVE mg/dL
Hgb urine dipstick: NEGATIVE
Ketones, ur: NEGATIVE mg/dL
Leukocytes, UA: NEGATIVE
Nitrite: NEGATIVE
Protein, ur: NEGATIVE mg/dL
Specific Gravity, Urine: 1.025 (ref 1.005–1.030)
Urobilinogen, UA: 0.2 mg/dL (ref 0.0–1.0)
pH: 6.5 (ref 5.0–8.0)

## 2011-09-04 LAB — ABO/RH: ABO/RH(D): O POS

## 2011-09-04 LAB — HCG, QUANTITATIVE, PREGNANCY: hCG, Beta Chain, Quant, S: 3629 m[IU]/mL — ABNORMAL HIGH (ref <5)

## 2011-09-04 MED ORDER — METRONIDAZOLE 500 MG PO TABS: 500.0000 mg | ORAL_TABLET | Freq: Two times a day (BID) | ORAL | Status: AC

## 2011-09-04 MED ORDER — FLUCONAZOLE 150 MG PO TABS: 150.0000 mg | ORAL_TABLET | Freq: Once | ORAL | Status: AC
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
## 2011-09-04 NOTE — MAU Note (Signed)
Sharp pains all over abd. Started on Tues.

## 2011-09-05 LAB — GC/CHLAMYDIA PROBE AMP, GENITAL
Chlamydia, DNA Probe: NEGATIVE
GC Probe Amp, Genital: NEGATIVE

## 2011-09-05 NOTE — MAU Provider Note (Signed)
Agree with above note.  Shirley [name] 09/05/2011 3:49 AM

## 2011-09-22 ENCOUNTER — Encounter (HOSPITAL_BASED_OUTPATIENT_CLINIC_OR_DEPARTMENT_OTHER): Payer: Self-pay | Admitting: *Deleted

## 2011-09-22 ENCOUNTER — Emergency Department (HOSPITAL_BASED_OUTPATIENT_CLINIC_OR_DEPARTMENT_OTHER)
Admission: EM | Admit: 2011-09-22 | Discharge: 2011-09-22 | Disposition: A | Discharge status: Home / Self Care | Payer: Medicaid Other | Attending: Emergency Medicine | Admitting: Emergency Medicine

## 2011-09-22 DIAGNOSIS — M19019 Primary osteoarthritis, unspecified shoulder: Secondary | ICD-10-CM | POA: Insufficient documentation

## 2011-09-22 DIAGNOSIS — M25511 Pain in right shoulder: Secondary | ICD-10-CM

## 2011-09-22 DIAGNOSIS — F17200 Nicotine dependence, unspecified, uncomplicated: Secondary | ICD-10-CM | POA: Insufficient documentation

## 2011-09-22 MED ORDER — OXYCODONE-ACETAMINOPHEN 5-325 MG PO TABS: ORAL_TABLET | ORAL | Status: DC

## 2011-09-22 MED ORDER — ACETAMINOPHEN 500 MG PO TABS
ORAL_TABLET | ORAL | Status: AC
  Filled 2011-09-22: qty 2

## 2011-09-22 MED ORDER — ACETAMINOPHEN 500 MG PO TABS
1000.0000 mg | ORAL_TABLET | Freq: Once | ORAL | Status: AC
  Administered 2011-09-22: 1000 mg via ORAL

## 2011-09-22 NOTE — ED Provider Notes (Signed)
History     CSN: [id number]  Arrival date & time 09/22/11  1118   First MD Initiated Contact with Patient 09/22/11 1158      Chief Complaint  Patient presents with  . Chest Pain    (Consider location/radiation/quality/duration/timing/severity/associated sxs/prior treatment) HPI  26 y.o. female in no acute distress complaining of 8/10 pain to right scapula onset this a.m. She has not taken any pain medication. Pain does not radiate. Pain is described as sharp and constant and exacerbated by movement. Patient denies any trauma or repetitive motion that would cause this. Denies any numbness, tingling, shortness of breath, nausea vomiting.  Past Medical History  Diagnosis Date  . Eczema     Past Surgical History  Procedure Date  . Cesarean section   . Wisdom tooth extraction   . Induced abortion     Family History  Problem Relation Age of Onset  . Other Neg Hx     History  Substance Use Topics  . Smoking status: Current Every Day Smoker -- 0.5 packs/day for 2 years    Types: Cigarettes  . Smokeless tobacco: Not on file  . Alcohol Use: No    OB History    Grav Para Term Preterm Abortions TAB SAB Ect Mult Living   6 3 1 2 2 2   1 4       Review of Systems  Constitutional: Negative for fever.  Respiratory: Negative for shortness of breath.   Cardiovascular: Negative for chest pain.  Gastrointestinal: Negative for nausea, vomiting, abdominal pain and diarrhea.  Musculoskeletal: Positive for arthralgias.  All other systems reviewed and are negative.    Allergies  Review of patient's allergies indicates no known allergies.  Home Medications  No current outpatient prescriptions on file.  BP 127/76  Pulse 72  Temp 98.3 F (36.8 C) (Oral)  Resp 16  Ht 5\' 1"  (1.549 m)  Wt 142 lb (64.411 kg)  BMI 26.83 kg/m2  SpO2 100%  LMP 07/27/2011  Physical Exam  Nursing note and vitals reviewed. Constitutional: She is oriented to person, place, and time. She appears  well-developed and well-nourished. No distress.  HENT:  Head: Normocephalic.  Eyes: Conjunctivae normal and EOM are normal.  Cardiovascular: Normal rate.   Pulmonary/Chest: Effort normal and breath sounds normal. No stridor.  Abdominal: Soft. Bowel sounds are normal.  Musculoskeletal: Normal range of motion.       Tenderness to palpation of right scapula. Full range of motion of right arm. Drop arm negative. No deformity, no swelling, no erythema.   Neurological: She is alert and oriented to person, place, and time.  Skin: Skin is warm and dry.  Psychiatric: She has a normal mood and affect.    ED Course  Procedures (including critical care time)  Labs Reviewed - No data to display No results found.   Date: 09/22/2011  Rate: 71  Rhythm: normal sinus rhythm  QRS Axis: indeterminate  Intervals: normal  ST/T Wave abnormalities: normal  Conduction Disutrbances:none  Narrative Interpretation:   Old EKG Reviewed: none available   1. Arthralgia of right shoulder region       MDM  Right scapular area pain acute onset this a.m. with no associated symptoms. I believe this to be musculoskeletal in nature as she has significant exacerbation with arm abduction. Patient states that she is [redacted] weeks pregnant, however she denies is lying in the room with her upon further discussion with the nurse she does not want her to [place] know  that she is pregnant.          Wynetta Emery, PA-C 09/22/11 1341

## 2011-09-22 NOTE — ED Notes (Signed)
PA-C at bedside speaking with pt.

## 2011-09-22 NOTE — ED Notes (Signed)
Patient states she developed a sudden onset of chest pain this morning.  Describes pain as sharp and constant.  Pt os [redacted] weeks pregnant.

## 2011-09-23 NOTE — ED Provider Notes (Signed)
Medical screening examination/treatment/procedure(s) were performed by non-physician practitioner and as supervising physician I was immediately available for consultation/collaboration.  Aidden Markovic, MD 09/23/11 0714 

## 2011-09-29 ENCOUNTER — Inpatient Hospital Stay (HOSPITAL_COMMUNITY)
Admission: AD | Admit: 2011-09-29 | Discharge: 2011-09-29 | Disposition: A | Discharge status: Home / Self Care | Payer: Medicaid Other | Source: Ambulatory Visit | Attending: Obstetrics & Gynecology | Admitting: Obstetrics & Gynecology

## 2011-09-29 ENCOUNTER — Encounter (HOSPITAL_COMMUNITY): Payer: Self-pay | Admitting: *Deleted

## 2011-09-29 DIAGNOSIS — O200 Threatened abortion: Secondary | ICD-10-CM | POA: Insufficient documentation

## 2011-09-29 DIAGNOSIS — O4690 Antepartum hemorrhage, unspecified, unspecified trimester: Secondary | ICD-10-CM

## 2011-09-29 MED ORDER — ACETAMINOPHEN 325 MG PO TABS: 650.0000 mg | ORAL_TABLET | Freq: Once | ORAL | Status: DC

## 2011-09-29 NOTE — MAU Note (Signed)
Had blood work, pelvic and [place].  Was given antibiotic injection for ? Infection.

## 2011-09-29 NOTE — MAU Note (Signed)
Went to [facility] today, has been bleeding for the past wk.  They did an Korea, said she was measuring 6 wks, should be 9wks. And there was no heart beat.

## 2011-09-29 NOTE — MAU Provider Note (Signed)
Medical Screening exam and patient care preformed by advanced practice provider.  Agree with the above management.  

## 2011-09-29 NOTE — MAU Provider Note (Signed)
  History     CSN: [id number]  Arrival date and time: 09/29/11 1623   First Provider Initiated Contact with Patient 09/29/11 1723      Chief Complaint  Patient presents with  . Threatened Miscarriage  . Vaginal Bleeding  . Abdominal Pain   HPIJamie ROKHAYA [name] is 26 y.o. [id number] [redacted]w[redacted]d weeks presenting with  Report that she was seen today at [facility] and told that she was miscarrying.  Told to follow up here.  She presents with questions of plan of care.  She was examined at the hospital today.  U/S report that she has with her "a well formed GS in the upper uterine segment containing an embryo.  Measures [redacted]w[redacted]d gestaion with no fetal heart activity.  Concerning for Intrauterine fetal demise.  Suggested repeat U/S in 5-7 days".  BHCG [id number].  On visit here 8/29 U/S showed [redacted]w[redacted]d IUGS with YS, no FP, CLC on left, small old [facility].  Blood type O+ and BHCG 3629. Past Medical History  Diagnosis Date  . Eczema     Past Surgical History  Procedure Date  . Cesarean section   . Wisdom tooth extraction   . Induced abortion     Family History  Problem Relation Age of Onset  . Other Neg Hx     History  Substance Use Topics  . Smoking status: Current Every Day Smoker -- 0.5 packs/day for 2 years    Types: Cigarettes  . Smokeless tobacco: Not on file  . Alcohol Use: No    Allergies: No Known Allergies  Prescriptions prior to admission  Medication Sig Dispense Refill  . oxyCODONE-acetaminophen (PERCOCET/ROXICET) 5-325 MG per tablet 1 to 2 tabs PO q6hrs  PRN for pain  10 tablet  0    Review of Systems  Genitourinary:       + spotting   Physical Exam   Blood pressure 136/87, pulse 85, temperature 98.3 F (36.8 C), temperature source Oral, resp. rate 20, height 5\' 2"  (1.575 [name]), weight 65.318 kg (144 lb), last menstrual period 07/27/2011.  Physical Exam  Constitutional: She is oriented to person, place, and time. She appears well-developed and well-nourished. No  distress.  Genitourinary:       Not indicated.  Performed at [facility] today.  Neurological: She is alert and oriented to person, place, and time.  Psychiatric: She has a normal mood and affect. Her behavior is normal.    MAU Course  Procedures  MDM 17:30  Discussed previous visit here and u/s and lab reports from [facility] today with Dr. Penne Lash. 18:00 Spoke to Dr. Eppie Gibson regarding measurements.  He feels 98% non viable but doesn't meet criteria to call IUFD, recommends follow in 1 week.   Reported this to Dr. Dorris Fetch back for outpatient ultrasound next Monday and in GYN clinic in 2 weeks  Assessment and Plan  A:  [redacted]w[redacted]d gestation without fetal cardiac activity     Vaginal spotting  Possible threatened abortion  P:  Repeat U/S in 1 week      GYN CLINIC in 2 weeks Return for heavy bleeding or severe abdominal pain  Shirley [name],Shirley [name] 09/29/2011, 5:27 PM

## 2011-10-03 ENCOUNTER — Other Ambulatory Visit: Payer: Self-pay | Admitting: Advanced Practice Midwife

## 2011-10-03 DIAGNOSIS — Z3490 Encounter for supervision of normal pregnancy, unspecified, unspecified trimester: Secondary | ICD-10-CM

## 2011-10-06 ENCOUNTER — Inpatient Hospital Stay (HOSPITAL_COMMUNITY): Payer: Medicaid Other

## 2011-10-06 ENCOUNTER — Inpatient Hospital Stay (HOSPITAL_COMMUNITY)
Admission: AD | Admit: 2011-10-06 | Discharge: 2011-10-06 | Disposition: A | Discharge status: Home / Self Care | Payer: Medicaid Other | Source: Ambulatory Visit | Attending: Obstetrics & Gynecology | Admitting: Obstetrics & Gynecology

## 2011-10-06 DIAGNOSIS — O200 Threatened abortion: Secondary | ICD-10-CM | POA: Insufficient documentation

## 2011-10-06 LAB — CBC WITH DIFFERENTIAL/PLATELET
Basophils Absolute: 0 10*3/uL (ref 0.0–0.1)
Basophils Relative: 0 % (ref 0–1)
Eosinophils Absolute: 0.1 10*3/uL (ref 0.0–0.7)
Eosinophils Relative: 2 % (ref 0–5)
HCT: 31.8 % — ABNORMAL LOW (ref 36.0–46.0)
Hemoglobin: 10.5 g/dL — ABNORMAL LOW (ref 12.0–15.0)
Lymphocytes Relative: 42 % (ref 12–46)
Lymphs Abs: 2.1 10*3/uL (ref 0.7–4.0)
MCH: 29.2 pg (ref 26.0–34.0)
MCHC: 33 g/dL (ref 30.0–36.0)
MCV: 88.3 fL (ref 78.0–100.0)
Monocytes Absolute: 0.3 10*3/uL (ref 0.1–1.0)
Monocytes Relative: 7 % (ref 3–12)
Neutro Abs: 2.4 10*3/uL (ref 1.7–7.7)
Neutrophils Relative %: 49 % (ref 43–77)
Platelets: 264 10*3/uL (ref 150–400)
RBC: 3.6 MIL/uL — ABNORMAL LOW (ref 3.87–5.11)
RDW: 15.1 % (ref 11.5–15.5)
WBC: 5 10*3/uL (ref 4.0–10.5)

## 2011-10-06 LAB — ABO/RH: ABO/RH(D): O POS

## 2011-10-06 LAB — HCG, QUANTITATIVE, PREGNANCY: hCG, Beta Chain, Quant, S: 13527 m[IU]/mL — ABNORMAL HIGH (ref <5)

## 2011-10-06 NOTE — MAU Provider Note (Signed)
  History     CSN: [id number]  Arrival date and time: 10/06/11 4098   None     Chief Complaint  Patient presents with  . Follow-up   HPI Shirley [name] is a 26 y.o. [id number] @ [redacted]w[redacted]d she is here for a follow up ultrasound. She was seen in MAU earlier in pregnancy and no fetal pole was seen. It was recommended that she have a FU ultrasound. The ultrasound did not get scheduled, so she is here now to have it done. She has some light spotting that has been lighter than a period for the past "few" days.   Past Medical History  Diagnosis Date  . Eczema     Past Surgical History  Procedure Date  . Cesarean section   . Wisdom tooth extraction   . Induced abortion     Family History  Problem Relation Age of Onset  . Other Neg Hx     History  Substance Use Topics  . Smoking status: Current Every Day Smoker -- 0.5 packs/day for 2 years    Types: Cigarettes  . Smokeless tobacco: Never Used  . Alcohol Use: No    Allergies: No Known Allergies  Prescriptions prior to admission  Medication Sig Dispense Refill  . oxyCODONE-acetaminophen (PERCOCET/ROXICET) 5-325 MG per tablet 1 to 2 tabs PO q6hrs  PRN for pain  10 tablet  0    ROS Physical Exam   Blood pressure 139/90, pulse 72, temperature 98.6 F (37 C), temperature source Oral, resp. rate 18, last menstrual period 07/27/2011.  Physical Exam  Nursing note and vitals reviewed. Constitutional: She appears well-developed and well-nourished.  Cardiovascular: Normal rate and regular rhythm.   Respiratory: Effort normal and breath sounds normal.    MAU Course  Procedures 10:05: Pt is not in waiting room. She left prior to being seen by a provider.  10:30: pt returned to MAU.     Assessment and Plan   1. Threatened abortion in first trimester   Repeat ultrasound on Friday as recommended by radiology.  Plan to offer cytotec at that time if appropriate.   Tawnya Crook 10/06/2011, 9:52 AM

## 2011-10-06 NOTE — MAU Provider Note (Signed)
Attestation of Attending Supervision of Advanced Practitioner (CNM/NP): Evaluation and management procedures were performed by the Advanced Practitioner under my supervision and collaboration.  I have reviewed the Advanced Practitioner's note and chart, and I agree with the management and plan.  Giulianna Rocha, MD, FACOG Attending Obstetrician & Gynecologist Faculty Practice, [facility]  

## 2011-10-06 NOTE — MAU Note (Signed)
Pt here for F/U U/S, states she is having some cramping today, has bleeding when she wipes.

## 2011-10-10 ENCOUNTER — Ambulatory Visit (HOSPITAL_COMMUNITY)
Admit: 2011-10-10 | Discharge: 2011-10-10 | Disposition: A | Discharge status: Home / Self Care | Payer: Medicaid Other | Attending: Advanced Practice Midwife | Admitting: Advanced Practice Midwife

## 2011-10-10 ENCOUNTER — Encounter (HOSPITAL_COMMUNITY): Payer: Self-pay | Admitting: *Deleted

## 2011-10-10 ENCOUNTER — Inpatient Hospital Stay (HOSPITAL_COMMUNITY)
Admission: AD | Admit: 2011-10-10 | Discharge: 2011-10-10 | Disposition: A | Discharge status: Home / Self Care | Payer: Medicaid Other | Source: Ambulatory Visit | Attending: Obstetrics | Admitting: Obstetrics

## 2011-10-10 DIAGNOSIS — O200 Threatened abortion: Secondary | ICD-10-CM

## 2011-10-10 DIAGNOSIS — O368390 Maternal care for abnormalities of the fetal heart rate or rhythm, unspecified trimester, not applicable or unspecified: Secondary | ICD-10-CM | POA: Insufficient documentation

## 2011-10-10 NOTE — MAU Provider Note (Signed)
History   Chief Complaint:  Follow-up   Shirley [name] is  26 y.o. [id number] Patient's last menstrual period was 07/27/2011.Marland Kitchen Patient is here for follow up of quantitative HCG and ongoing surveillance of pregnancy status.   She is [redacted]w[redacted]d weeks gestation  by LMP. 6.1 week SIUP w/out cardiac activity identified on [place] 10/06/11. Instructed to F/U for viability Korea today,  Since her last visit, the patient is with new complaint.   The patient reports bleeding as  lighter than period.  Heavy cramping and increased bleeding and passed ? GS yesterday. Brought photo C/W GC. Bleeding much lighter. No cramping.   General ROS:  otherwise negative.  Physical Exam   Blood pressure 134/89, pulse 75, temperature 98.4 F (36.9 C), temperature source Oral, resp. rate 18, last menstrual period 07/27/2011, unknown if currently breastfeeding.  Focused Gynecological Exam: Not indicated Labs: No results found for this or any previous visit (from the past 24 hour(s)).  Ultrasound Studies:      Assessment: Completed SAB  Plan: D/C home F/U w/ Dr. Gaynell Face in 1-2 weeks or MAU PRN for heavy bleeding or fever Bleeding precautions Support given   Medication List    Notice       You have not been prescribed any medications.          Dayton Kenley 10/10/2011, 10:07 AM

## 2011-10-10 NOTE — MAU Note (Signed)
Was cramping really bad last night, about 0230- passed the baby- had picture of what she passed  ~ 50cent piece sized pinkish tissue/like clot.  Bled really heavy initially. The pain has stopped since then and the bleeding has slowed down.

## 2012-04-24 ENCOUNTER — Encounter (HOSPITAL_BASED_OUTPATIENT_CLINIC_OR_DEPARTMENT_OTHER): Payer: Self-pay | Admitting: *Deleted

## 2012-04-24 ENCOUNTER — Emergency Department (HOSPITAL_BASED_OUTPATIENT_CLINIC_OR_DEPARTMENT_OTHER): Payer: Self-pay

## 2012-04-24 ENCOUNTER — Emergency Department (HOSPITAL_BASED_OUTPATIENT_CLINIC_OR_DEPARTMENT_OTHER)
Admission: EM | Admit: 2012-04-24 | Discharge: 2012-04-24 | Disposition: A | Discharge status: Home / Self Care | Payer: Self-pay | Attending: Emergency Medicine | Admitting: Emergency Medicine

## 2012-04-24 DIAGNOSIS — R071 Chest pain on breathing: Secondary | ICD-10-CM | POA: Insufficient documentation

## 2012-04-24 DIAGNOSIS — R0781 Pleurodynia: Secondary | ICD-10-CM

## 2012-04-24 DIAGNOSIS — R05 Cough: Secondary | ICD-10-CM | POA: Insufficient documentation

## 2012-04-24 DIAGNOSIS — B9689 Other specified bacterial agents as the cause of diseases classified elsewhere: Secondary | ICD-10-CM

## 2012-04-24 DIAGNOSIS — Z3202 Encounter for pregnancy test, result negative: Secondary | ICD-10-CM | POA: Insufficient documentation

## 2012-04-24 DIAGNOSIS — N760 Acute vaginitis: Secondary | ICD-10-CM | POA: Insufficient documentation

## 2012-04-24 DIAGNOSIS — N898 Other specified noninflammatory disorders of vagina: Secondary | ICD-10-CM | POA: Insufficient documentation

## 2012-04-24 DIAGNOSIS — R059 Cough, unspecified: Secondary | ICD-10-CM | POA: Insufficient documentation

## 2012-04-24 DIAGNOSIS — Z872 Personal history of diseases of the skin and subcutaneous tissue: Secondary | ICD-10-CM | POA: Insufficient documentation

## 2012-04-24 LAB — URINALYSIS, ROUTINE W REFLEX MICROSCOPIC
Bilirubin Urine: NEGATIVE
Glucose, UA: NEGATIVE mg/dL
Ketones, ur: NEGATIVE mg/dL
Nitrite: NEGATIVE
Protein, ur: NEGATIVE mg/dL
Specific Gravity, Urine: 1.024 (ref 1.005–1.030)
Urobilinogen, UA: 0.2 mg/dL (ref 0.0–1.0)
pH: 6 (ref 5.0–8.0)

## 2012-04-24 LAB — COMPREHENSIVE METABOLIC PANEL
ALT: 11 U/L (ref 0–35)
AST: 19 U/L (ref 0–37)
Albumin: 4 g/dL (ref 3.5–5.2)
Alkaline Phosphatase: 85 U/L (ref 39–117)
BUN: 8 mg/dL (ref 6–23)
CO2: 26 mEq/L (ref 19–32)
Calcium: 9.4 mg/dL (ref 8.4–10.5)
Chloride: 101 mEq/L (ref 96–112)
Creatinine, Ser: 0.6 mg/dL (ref 0.50–1.10)
GFR calc Af Amer: >90 mL/min (ref >90)
GFR calc non Af Amer: >90 mL/min (ref >90)
Glucose, Bld: 87 mg/dL (ref 70–99)
Potassium: 4 mEq/L (ref 3.5–5.1)
Sodium: 138 mEq/L (ref 135–145)
Total Bilirubin: 0.4 mg/dL (ref 0.3–1.2)
Total Protein: 7.5 g/dL (ref 6.0–8.3)

## 2012-04-24 LAB — URINE MICROSCOPIC-ADD ON

## 2012-04-24 LAB — CBC WITH DIFFERENTIAL/PLATELET
Basophils Absolute: 0 10*3/uL (ref 0.0–0.1)
Basophils Relative: 0 % (ref 0–1)
Eosinophils Absolute: 0.1 10*3/uL (ref 0.0–0.7)
Eosinophils Relative: 2 % (ref 0–5)
HCT: 32.5 % — ABNORMAL LOW (ref 36.0–46.0)
Hemoglobin: 10.7 g/dL — ABNORMAL LOW (ref 12.0–15.0)
Lymphocytes Relative: 27 % (ref 12–46)
Lymphs Abs: 1.8 10*3/uL (ref 0.7–4.0)
MCH: 27.9 pg (ref 26.0–34.0)
MCHC: 32.9 g/dL (ref 30.0–36.0)
MCV: 84.9 fL (ref 78.0–100.0)
Monocytes Absolute: 0.5 10*3/uL (ref 0.1–1.0)
Monocytes Relative: 8 % (ref 3–12)
Neutro Abs: 4.3 10*3/uL (ref 1.7–7.7)
Neutrophils Relative %: 63 % (ref 43–77)
Platelets: 303 10*3/uL (ref 150–400)
RBC: 3.83 MIL/uL — ABNORMAL LOW (ref 3.87–5.11)
RDW: 14.5 % (ref 11.5–15.5)
WBC: 6.8 10*3/uL (ref 4.0–10.5)

## 2012-04-24 LAB — LIPASE, BLOOD: Lipase: 33 U/L (ref 11–59)

## 2012-04-24 LAB — RPR: RPR Ser Ql: NONREACTIVE

## 2012-04-24 LAB — WET PREP, GENITAL
Trich, Wet Prep: NONE SEEN
Yeast Wet Prep HPF POC: NONE SEEN

## 2012-04-24 LAB — PREGNANCY, URINE: Preg Test, Ur: NEGATIVE

## 2012-04-24 MED ORDER — KETOROLAC TROMETHAMINE 30 MG/ML IJ SOLN
30.0000 mg | Freq: Once | INTRAMUSCULAR | Status: AC
  Administered 2012-04-24: 30 mg via INTRAVENOUS
  Filled 2012-04-24: qty 1

## 2012-04-24 MED ORDER — METRONIDAZOLE 500 MG PO TABS: 500.0000 mg | ORAL_TABLET | Freq: Two times a day (BID) | ORAL | Status: DC

## 2012-04-24 MED ORDER — NAPROXEN 500 MG PO TABS: 500.0000 mg | ORAL_TABLET | Freq: Two times a day (BID) | ORAL | Status: DC

## 2012-04-24 NOTE — ED Provider Notes (Signed)
History  This chart was scribed for Dione Booze, MD by Bennett Scrape, ED Scribe. This patient was seen in room MH10/MH10 and the patient's care was started at 4:13 PM.  CSN: [id number]  Arrival date & time 04/24/12  1452   First MD Initiated Contact with Patient 04/24/12 1613      Chief Complaint  Patient presents with  . Abdominal Pain     The history is provided by the patient. No language interpreter was used.    Shirley [name] is a 27 y.o. female who presents to the Emergency Department complaining of 2 days of gradual onset, gradually worsening, intermittent right-sided rib pain described as sharp. The pain is worse with deep breathing and movements and stops as soon as the activity stops. She rates her pain a 9 out of 10 currently. She reports a recent cough over the past 2 weeks that is now resolved. She states that she has experienced prior episodes of the same during strenuous activity but became concerned by the length of time that the symptoms have been continuing for. She denies taking OTC medications at home to improve symptoms. She denies SOB, fever, chills, nausea and emesis as associated symptoms. She also reports 3 to 4 days of non-changing lower abdominal pain described as cramping. She rates this pain a 4 or 5 out of 10 currently. She denies having any modifying factors and denies taking OTC medications at home to improve symptoms. She denies any urinary symptoms, diarrhea and constipation as associated symptoms. She also c/o mild vaginal bleeding that she noticed while wiping today. Her LNMP started on April 6th, 2014 and ended on the 9th. She states that the menses was 2 to 3 days late and ended 1 day earlier than normal. She reports that she is sexually active but denies that she is using birth control or condoms to prevent pregnancy. She is G6P3(one set of twins)A3. She denies breast swelling or tenderness and vaginal discharge as associated symptoms. She is a current 10  cigarettes per day smoker but denies alcohol use.   GYN is Dr. Gaynell Face. No PCP  Past Medical History  Diagnosis Date  . Eczema     Past Surgical History  Procedure Laterality Date  . Cesarean section    . Wisdom tooth extraction    . Induced abortion      Family History  Problem Relation Age of Onset  . Other Neg Hx   . Hearing loss Neg Hx   . Cancer Maternal Grandmother     breast    History  Substance Use Topics  . Smoking status: Current Every Day Smoker -- 0.50 packs/day for 2 years    Types: Cigarettes  . Smokeless tobacco: Never Used  . Alcohol Use: No    OB History   Grav Para Term Preterm Abortions TAB SAB Ect Mult Living   6 3 1 2 2 2   1 4       Review of Systems  Constitutional: Negative for fever and chills.  Respiratory: Positive for cough. Negative for shortness of breath.   Cardiovascular: Negative for chest pain.  Gastrointestinal: Positive for abdominal pain. Negative for nausea, vomiting, diarrhea and constipation.  Genitourinary: Positive for vaginal bleeding. Negative for dysuria, urgency, frequency and vaginal discharge.  All other systems reviewed and are negative.    Allergies  Review of patient's allergies indicates no known allergies.  Home Medications  No current outpatient prescriptions on file.  Triage Vitals: BP 160/96  Pulse 82  Temp(Src) 99.6 F (37.6 C) (Oral)  Resp 18  Ht 5' (1.524 m)  Wt 142 lb (64.411 kg)  BMI 27.73 kg/m2  SpO2 98%  LMP 04/17/2012  Breastfeeding? No  Physical Exam  Nursing note and vitals reviewed. Constitutional: She is oriented to person, place, and time. She appears well-developed and well-nourished. No distress.  HENT:  Head: Normocephalic and atraumatic.  Eyes: Conjunctivae and EOM are normal.  Neck: Neck supple. No tracheal deviation present.  Cardiovascular: Normal rate and regular rhythm.   Pulmonary/Chest: Effort normal and breath sounds normal. No respiratory distress. She  exhibits no tenderness (no rib tenderness).  Abdominal: Soft. There is tenderness (mild right subcostal tenderness with positive murphy's sign).  No suprapubic tenderness  Genitourinary:  Normal external female genitalia. Cervix has mucoid discharge present. Small amount of slightly bloody vaginal discharge present. Fundus is normal size and position. There is no cervical motion tenderness. There are no adnexal masses present. There is mild tenderness diffusely.  Musculoskeletal: Normal range of motion. She exhibits no edema.  Neurological: She is alert and oriented to person, place, and time.  Skin: Skin is warm and dry.  Psychiatric: She has a normal mood and affect. Her behavior is normal.    ED Course  Procedures (including critical care time)  Medications  ketorolac (TORADOL) 30 MG/ML injection 30 mg (not administered)    DIAGNOSTIC STUDIES: Oxygen Saturation is 98% on room air, normal by my interpretation.    COORDINATION OF CARE: 4:31 PM-Reviewed pt's UA results with pt. Discussed treatment plan which includes [place] of abdomen, CXR, CBC panel, CMP and pain medication with pt at bedside and pt agreed to plan.   Results for orders placed during the hospital encounter of 04/24/12  WET PREP, GENITAL      Result Value Range   Yeast Wet Prep HPF POC NONE SEEN  NONE SEEN   Trich, Wet Prep NONE SEEN  NONE SEEN   Clue Cells Wet Prep HPF POC TOO NUMEROUS TO COUNT (*) NONE SEEN   WBC, Wet Prep HPF POC TOO NUMEROUS TO COUNT (*) NONE SEEN  URINALYSIS, ROUTINE W REFLEX MICROSCOPIC      Result Value Range   Color, Urine YELLOW  YELLOW   APPearance CLOUDY (*) CLEAR   Specific Gravity, Urine 1.024  1.005 - 1.030   pH 6.0  5.0 - 8.0   Glucose, UA NEGATIVE  NEGATIVE mg/dL   Hgb urine dipstick LARGE (*) NEGATIVE   Bilirubin Urine NEGATIVE  NEGATIVE   Ketones, ur NEGATIVE  NEGATIVE mg/dL   Protein, ur NEGATIVE  NEGATIVE mg/dL   Urobilinogen, UA 0.2  0.0 - 1.0 mg/dL   Nitrite NEGATIVE   NEGATIVE   Leukocytes, UA SMALL (*) NEGATIVE  PREGNANCY, URINE      Result Value Range   Preg Test, Ur NEGATIVE  NEGATIVE  URINE MICROSCOPIC-ADD ON      Result Value Range   Squamous Epithelial / LPF FEW (*) RARE   WBC, UA 7-10  <3 WBC/hpf   RBC / HPF 3-6  <3 RBC/hpf   Bacteria, UA FEW (*) RARE   Urine-Other MUCOUS PRESENT    CBC WITH DIFFERENTIAL      Result Value Range   WBC 6.8  4.0 - 10.5 K/uL   RBC 3.83 (*) 3.87 - 5.11 MIL/uL   Hemoglobin 10.7 (*) 12.0 - 15.0 g/dL   HCT 16.1 (*) 09.6 - 04.5 %   MCV 84.9  78.0 - 100.0 fL  MCH 27.9  26.0 - 34.0 pg   MCHC 32.9  30.0 - 36.0 g/dL   RDW 16.1  09.6 - 04.5 %   Platelets 303  150 - 400 K/uL   Neutrophils Relative 63  43 - 77 %   Neutro Abs 4.3  1.7 - 7.7 K/uL   Lymphocytes Relative 27  12 - 46 %   Lymphs Abs 1.8  0.7 - 4.0 K/uL   Monocytes Relative 8  3 - 12 %   Monocytes Absolute 0.5  0.1 - 1.0 K/uL   Eosinophils Relative 2  0 - 5 %   Eosinophils Absolute 0.1  0.0 - 0.7 K/uL   Basophils Relative 0  0 - 1 %   Basophils Absolute 0.0  0.0 - 0.1 K/uL  COMPREHENSIVE METABOLIC PANEL      Result Value Range   Sodium 138  135 - 145 mEq/L   Potassium 4.0  3.5 - 5.1 mEq/L   Chloride 101  96 - 112 mEq/L   CO2 26  19 - 32 mEq/L   Glucose, Bld 87  70 - 99 mg/dL   BUN 8  6 - 23 mg/dL   Creatinine, Ser 4.09  0.50 - 1.10 mg/dL   Calcium 9.4  8.4 - 81.1 mg/dL   Total Protein 7.5  6.0 - 8.3 g/dL   Albumin 4.0  3.5 - 5.2 g/dL   AST 19  0 - 37 U/L   ALT 11  0 - 35 U/L   Alkaline Phosphatase 85  39 - 117 U/L   Total Bilirubin 0.4  0.3 - 1.2 mg/dL   GFR calc non Af Amer >90  >90 mL/min   GFR calc Af Amer >90  >90 mL/min  LIPASE, BLOOD      Result Value Range   Lipase 33  11 - 59 U/L   Dg Chest 2 View  04/24/2012  *RADIOLOGY REPORT*  Clinical Data: Mild right chest pain for 4 days, no known injury, increased pain with inspiration  CHEST - 2 VIEW  Comparison: None  Findings: Normal heart size, mediastinal contours, and pulmonary  vascularity. Minimal peribronchial thickening. No pulmonary infiltrate, pleural effusion or pneumothorax. Bones unremarkable.  IMPRESSION: Minimal bronchitic changes without acute infiltrate.   Original Report Authenticated By: Ulyses Southward, M.D.    US Abdomen Complete  04/24/2012  *RADIOLOGY REPORT*  Clinical Data:  Right abdominal pain  COMPLETE ABDOMINAL ULTRASOUND  Comparison:  None.  Findings:  Gallbladder:  No gallstones, gallbladder wall thickening, or pericholecystic fluid.  Negative sonographic Murphy's sign.  Common bile duct:  Measures 3 mm.  Liver:  No focal lesion identified.  Within normal limits in parenchymal echogenicity.  IVC:  Poorly visualized.  Pancreas:  Poorly visualized due to overlying bowel gas.  Spleen:  Measures 6.2 cm.  Right Kidney:  Measures 10.4 cm.  No mass or hydronephrosis.  Left Kidney:  Measures 11.1 cm.  No mass or hydronephrosis.  Abdominal aorta:  No aneurysm identified.  IMPRESSION: Negative abdominal ultrasound.   Original Report Authenticated By: Charline Bills, M.D.       1. Pleuritic pain   2. Bacterial vaginosis       MDM  Pleuritic pain but tenderness in the gallbladder area. Chest x-ray will be obtained as well as abdominal ultrasound. Mucoid drainage from the cervix is suggestive of ovulatory phase and some of her pelvic pain may be related to ovulation.  Chest x-ray and abdominal ultrasound are negative. Vaginal swab is positive for  bacterial vaginosis. She got good relief of pain with ketorolac. She is discharged with prescriptions for naproxen and metronidazole.  I personally performed the services described in this documentation, which was scribed in my presence. The recorded information has been reviewed and is accurate.     Dione Booze, MD 04/24/12 [phone]

## 2012-04-24 NOTE — ED Notes (Signed)
Pelvic cart is set up at the bedside and ready for the doctor to use. 

## 2012-04-24 NOTE — ED Notes (Signed)
Pt describes right rib pain that is worse with deep inspiration for a couple of days. Also c/o low abd cramping. LMP last week. Noticed blood when wiping.

## 2012-04-25 LAB — URINE CULTURE: Colony Count: 3000

## 2012-04-25 LAB — HIV ANTIBODY (ROUTINE TESTING W REFLEX): HIV: NONREACTIVE

## 2012-04-27 LAB — GC/CHLAMYDIA PROBE AMP
CT Probe RNA: POSITIVE — AB
GC Probe RNA: POSITIVE — AB

## 2012-04-28 ENCOUNTER — Telehealth (HOSPITAL_COMMUNITY): Payer: Self-pay | Admitting: Emergency Medicine

## 2012-04-28 NOTE — ED Notes (Signed)
+  Chlamydia. +Gonorrhea. Chart sent to EDP office for review. [employer] attached. 

## 2012-04-28 NOTE — ED Notes (Signed)
Patient has +Chlamydia and +Gonorrhea. °

## 2012-04-30 ENCOUNTER — Telehealth (HOSPITAL_COMMUNITY): Payer: Self-pay | Admitting: Emergency Medicine

## 2012-05-01 ENCOUNTER — Encounter (HOSPITAL_BASED_OUTPATIENT_CLINIC_OR_DEPARTMENT_OTHER): Payer: Self-pay | Admitting: *Deleted

## 2012-05-01 ENCOUNTER — Telehealth (HOSPITAL_COMMUNITY): Payer: Self-pay | Admitting: Emergency Medicine

## 2012-05-01 DIAGNOSIS — A64 Unspecified sexually transmitted disease: Secondary | ICD-10-CM | POA: Insufficient documentation

## 2012-05-01 DIAGNOSIS — F17200 Nicotine dependence, unspecified, uncomplicated: Secondary | ICD-10-CM | POA: Insufficient documentation

## 2012-05-01 DIAGNOSIS — Z872 Personal history of diseases of the skin and subcutaneous tissue: Secondary | ICD-10-CM | POA: Insufficient documentation

## 2012-05-01 NOTE — ED Notes (Signed)
Pt states she was seen here last Sat and got a phone call today stating she was + for GC/Chlamydia. Needs treatment because she could not get her Rx filled.

## 2012-05-02 ENCOUNTER — Emergency Department (HOSPITAL_BASED_OUTPATIENT_CLINIC_OR_DEPARTMENT_OTHER)
Admission: EM | Admit: 2012-05-02 | Discharge: 2012-05-02 | Disposition: A | Discharge status: Home / Self Care | Payer: Self-pay | Attending: Emergency Medicine | Admitting: Emergency Medicine

## 2012-05-02 DIAGNOSIS — A64 Unspecified sexually transmitted disease: Secondary | ICD-10-CM

## 2012-05-02 MED ORDER — LIDOCAINE HCL (PF) 1 % IJ SOLN
INTRAMUSCULAR | Status: AC
  Filled 2012-05-02: qty 5

## 2012-05-02 MED ORDER — AZITHROMYCIN 250 MG PO TABS
1000.0000 mg | ORAL_TABLET | Freq: Once | ORAL | Status: AC
  Administered 2012-05-02: 1000 mg via ORAL
  Filled 2012-05-02: qty 4

## 2012-05-02 MED ORDER — CEFTRIAXONE SODIUM 250 MG IJ SOLR
250.0000 mg | Freq: Once | INTRAMUSCULAR | Status: AC
  Administered 2012-05-02: 250 mg via INTRAMUSCULAR
  Filled 2012-05-02: qty 250

## 2012-05-02 NOTE — ED Provider Notes (Signed)
History     CSN: [id number]  Arrival date & time 05/01/12  2304   First MD Initiated Contact with Patient 05/02/12 0116      Chief Complaint  Patient presents with  . Follow-up     HPI Pt is here for treatment of gonorrhea/chlamydia Pt was seen on 04/19 for abdominal pain and found to have GC/chlam She denies fever/vomiting/abdominal pain She has not been treated as of yet Nothing improves symptoms Nothing worsens symptoms  Past Medical History  Diagnosis Date  . Eczema     Past Surgical History  Procedure Laterality Date  . Cesarean section    . Wisdom tooth extraction    . Induced abortion      Family History  Problem Relation Age of Onset  . Other Neg Hx   . Hearing loss Neg Hx   . Cancer Maternal Grandmother     breast    History  Substance Use Topics  . Smoking status: Current Every Day Smoker -- 0.50 packs/day for 2 years    Types: Cigarettes  . Smokeless tobacco: Never Used  . Alcohol Use: No    OB History   Grav Para Term Preterm Abortions TAB SAB Ect Mult Living   6 3 1 2 2 2   1 4       Review of Systems  Constitutional: Negative for fever.  Gastrointestinal: Negative for vomiting.    Allergies  Review of patient's allergies indicates no known allergies.  Home Medications   Current Outpatient Rx  Name  Route  Sig  Dispense  Refill  . metroNIDAZOLE (FLAGYL) 500 MG tablet   Oral   Take 1 tablet (500 mg total) by mouth 2 (two) times daily.   14 tablet   0   . naproxen (NAPROSYN) 500 MG tablet   Oral   Take 1 tablet (500 mg total) by mouth 2 (two) times daily.   30 tablet   0     BP 168/99  Pulse 92  Temp(Src) 99.2 F (37.3 C) (Oral)  Resp 18  Ht 5' (1.524 m)  Wt 148 lb (67.132 kg)  BMI 28.9 kg/m2  SpO2 100%  LMP 04/17/2012  Physical Exam CONSTITUTIONAL: Well developed/well nourished HEAD: Normocephalic/atraumatic EYES: EOMI/PERRL ENMT: Mucous membranes moist, uvula midline, pharynx nonerythematous NECK: supple no  meningeal signsr CV: S1/S2 noted, no murmurs/rubs/gallops noted LUNGS: Lungs are clear to auscultation bilaterally, no apparent distress ABDOMEN: soft, nontender, no rebound or guarding NEURO: Pt is awake/alert, moves all extremitiesx4 EXTREMITIES: pulses normal, full ROM SKIN: warm, color normal PSYCH: no abnormalities of mood noted  ED Course  Procedures   1. STD (sexually transmitted disease)       MDM  Nursing notes including past medical history and social history reviewed and considered in documentation Previous records reviewed and considered - labs reviewed reveal +GC/chlamydia         Joya Gaskins, MD 05/02/12 [phone]

## 2012-10-04 ENCOUNTER — Encounter (HOSPITAL_COMMUNITY): Payer: Self-pay | Admitting: *Deleted

## 2012-10-04 ENCOUNTER — Inpatient Hospital Stay (HOSPITAL_COMMUNITY)
Admission: AD | Admit: 2012-10-04 | Discharge: 2012-10-04 | Disposition: A | Discharge status: Home / Self Care | Payer: Medicaid Other | Source: Ambulatory Visit | Attending: Obstetrics | Admitting: Obstetrics

## 2012-10-04 DIAGNOSIS — B3731 Acute candidiasis of vulva and vagina: Secondary | ICD-10-CM | POA: Insufficient documentation

## 2012-10-04 DIAGNOSIS — N764 Abscess of vulva: Secondary | ICD-10-CM | POA: Insufficient documentation

## 2012-10-04 DIAGNOSIS — B373 Candidiasis of vulva and vagina: Secondary | ICD-10-CM | POA: Insufficient documentation

## 2012-10-04 HISTORY — DX: Essential (primary) hypertension: I10

## 2012-10-04 LAB — WET PREP, GENITAL: Trich, Wet Prep: NONE SEEN

## 2012-10-04 LAB — URINALYSIS, ROUTINE W REFLEX MICROSCOPIC
Bilirubin Urine: NEGATIVE
Glucose, UA: NEGATIVE mg/dL
Hgb urine dipstick: NEGATIVE
Ketones, ur: NEGATIVE mg/dL
Leukocytes, UA: NEGATIVE
Nitrite: NEGATIVE
Protein, ur: NEGATIVE mg/dL
Specific Gravity, Urine: 1.02 (ref 1.005–1.030)
Urobilinogen, UA: 0.2 mg/dL (ref 0.0–1.0)
pH: 8 (ref 5.0–8.0)

## 2012-10-04 LAB — POCT PREGNANCY, URINE: Preg Test, Ur: NEGATIVE

## 2012-10-04 MED ORDER — PROMETHAZINE HCL 25 MG PO TABS
25.0000 mg | ORAL_TABLET | Freq: Once | ORAL | Status: AC
  Administered 2012-10-04: 25 mg via ORAL
  Filled 2012-10-04: qty 1

## 2012-10-04 MED ORDER — OXYCODONE-ACETAMINOPHEN 5-325 MG PO TABS
2.0000 | ORAL_TABLET | Freq: Once | ORAL | Status: AC
  Administered 2012-10-04: 2 via ORAL
  Filled 2012-10-04: qty 2

## 2012-10-04 MED ORDER — LIDOCAINE HCL (PF) 1 % IJ SOLN
2.0000 mL | Freq: Once | INTRAMUSCULAR | Status: AC
  Administered 2012-10-04: 2 mL via INTRADERMAL

## 2012-10-04 MED ORDER — FLUCONAZOLE 150 MG PO TABS: 150.0000 mg | ORAL_TABLET | Freq: Once | ORAL | Status: DC

## 2012-10-04 MED ORDER — FLUCONAZOLE 150 MG PO TABS
150.0000 mg | ORAL_TABLET | Freq: Once | ORAL | Status: AC
  Administered 2012-10-04: 150 mg via ORAL
  Filled 2012-10-04: qty 1

## 2012-10-04 MED ORDER — LIDOCAINE HCL 2 % EX GEL
Freq: Once | CUTANEOUS | Status: AC
  Administered 2012-10-04: 5 via TOPICAL
  Filled 2012-10-04: qty 20

## 2012-10-04 MED ORDER — CEPHALEXIN 500 MG PO CAPS: 500.0000 mg | ORAL_CAPSULE | Freq: Four times a day (QID) | ORAL | Status: DC

## 2012-10-04 NOTE — MAU Note (Signed)
Pt C/O boil near vagina since Saturday, opened it with a pin yesterday & it drained.  Still painful today.

## 2012-10-04 NOTE — MAU Provider Note (Signed)
Attestation of Attending Supervision of Advanced Practitioner (CNM/NP): Evaluation and management procedures were performed by the Advanced Practitioner under my supervision and collaboration.  I have reviewed the Advanced Practitioner's note and chart, and I agree with the management and plan.  HARRAWAY-SMITH, Alee Gressman 6:42 PM

## 2012-10-04 NOTE — MAU Provider Note (Signed)
History     CSN: [id number]  Arrival date and time: 10/04/12 4098   First Provider Initiated Contact with Patient 10/04/12 1155      Chief Complaint  Patient presents with  . Boil    HPI  Ms. Shirley [name] is a 27 y.o. non-pregnant female [phone] who presents with complaints of a "boil on her vagina". She first noticed the boil 3 days ago and it has progressively gotten bigger in size and more painful. She stuck a needle in it last night and drained a small amount of fluid; she believes it needs to be drained further. She would also like to be tested for STI's today.   OB History   Grav Para Term Preterm Abortions TAB SAB Ect Mult Living   6 3 1 2 2 2   1 4       Past Medical History  Diagnosis Date  . Eczema   . Hypertension     Past Surgical History  Procedure Laterality Date  . Cesarean section    . Wisdom tooth extraction    . Induced abortion      Family History  Problem Relation Age of Onset  . Other Neg Hx   . Hearing loss Neg Hx   . Cancer Maternal Grandmother     breast    History  Substance Use Topics  . Smoking status: Current Every Day Smoker -- 0.50 packs/day for 2 years    Types: Cigarettes  . Smokeless tobacco: Never Used  . Alcohol Use: No    Allergies: No Known Allergies  No prescriptions prior to admission   Results for orders placed during the hospital encounter of 10/04/12 (from the past 24 hour(s))  URINALYSIS, ROUTINE W REFLEX MICROSCOPIC     Status: None   Collection Time    10/04/12  9:50 AM      Result Value Range   Color, Urine YELLOW  YELLOW   APPearance CLEAR  CLEAR   Specific Gravity, Urine 1.020  1.005 - 1.030   pH 8.0  5.0 - 8.0   Glucose, UA NEGATIVE  NEGATIVE mg/dL   Hgb urine dipstick NEGATIVE  NEGATIVE   Bilirubin Urine NEGATIVE  NEGATIVE   Ketones, ur NEGATIVE  NEGATIVE mg/dL   Protein, ur NEGATIVE  NEGATIVE mg/dL   Urobilinogen, UA 0.2  0.0 - 1.0 mg/dL   Nitrite NEGATIVE  NEGATIVE   Leukocytes, UA NEGATIVE   NEGATIVE  POCT PREGNANCY, URINE     Status: None   Collection Time    10/04/12 10:01 AM      Result Value Range   Preg Test, Ur NEGATIVE  NEGATIVE  WET PREP, GENITAL     Status: Abnormal   Collection Time    10/04/12 12:57 PM      Result Value Range   Yeast Wet Prep HPF POC MODERATE (*) NONE SEEN   Trich, Wet Prep NONE SEEN  NONE SEEN   Clue Cells Wet Prep HPF POC FEW (*) NONE SEEN   WBC, Wet Prep HPF POC MODERATE (*) NONE SEEN   Review of Systems  Constitutional: Negative for fever and chills.  Gastrointestinal: Negative for nausea, vomiting, abdominal pain, diarrhea and constipation.  Genitourinary: Negative for dysuria, urgency, frequency and hematuria.       No vaginal discharge. No vaginal bleeding. No dysuria. + left labial boil   Neurological: Negative for headaches.   Physical Exam   Blood pressure 124/70, pulse 75, temperature 98.5 F (36.9 C), temperature  source Oral, resp. rate 18, last menstrual period 08/24/2012.  Physical Exam  Constitutional: She is oriented to person, place, and time. She appears well-developed and well-nourished.  HENT:  Head: Normocephalic.  Neck: Neck supple.  Respiratory: Effort normal.  GI: Soft. She exhibits no distension. There is no tenderness. There is no rebound and no guarding.  Genitourinary:    There is tenderness and injury on the left labia. Vaginal discharge found.  Speculum exam: Vagina - Moderate amount of thick-curd like discharge, no odor Cervix - No contact bleeding Bimanual exam: Deferred  GC/Chlam, wet prep done Chaperone present for exam.  Dime size left labial abscess; soft-firm at the center.  Pin hole needle at the center of the abscess where patient punctured it.    Neurological: She is alert and oriented to person, place, and time.  Skin: Skin is warm and dry.    MAU Course  Procedures  Consent form signed. Time out completed   Patient positioned and draped with sterile towels.  Preoperative  medication: Lidocaine gel applied to the area. Percocet 2 tablet po    Area cleaned with betadine times 3 swabs Local infiltrate with lidocaine 1%. Amount 1 ccs  I&D Scalpel size: #11blade Incision type: Straight single Complexity: Complex Drained small amount of purulent drainage Probed with curved hemostat to break up loculations; minimal drainage following break up of loculations  Packing: none Patient tolerance: Tolerated procedure well.   MDM Wet prep GC/Chlamydia    Assessment and Plan  A: Incision and drainage of left labial abscess Yeast vaginitis   P: Discharge home Warm soaks to abscess as often as tolerated  RX: Keflex 500 mg QID times 7 days (#28) no rf        Diflucan 150 mg po in 3 days (#2) no rf Return to MAU if symptoms worsen  Ok to take tylenol or ibuprofen as needed for pain.   RASCH, JENNIFER IRENE FNP-C 10/04/2012, 11:55 AM

## 2012-10-05 LAB — GC/CHLAMYDIA PROBE AMP
CT Probe RNA: NEGATIVE
GC Probe RNA: NEGATIVE

## 2012-10-30 ENCOUNTER — Encounter (HOSPITAL_BASED_OUTPATIENT_CLINIC_OR_DEPARTMENT_OTHER): Payer: Self-pay | Admitting: Emergency Medicine

## 2012-10-30 ENCOUNTER — Emergency Department (HOSPITAL_BASED_OUTPATIENT_CLINIC_OR_DEPARTMENT_OTHER)
Admission: EM | Admit: 2012-10-30 | Discharge: 2012-10-30 | Disposition: A | Discharge status: Home / Self Care | Payer: Medicaid Other | Attending: Emergency Medicine | Admitting: Emergency Medicine

## 2012-10-30 DIAGNOSIS — M79609 Pain in unspecified limb: Secondary | ICD-10-CM | POA: Insufficient documentation

## 2012-10-30 DIAGNOSIS — I10 Essential (primary) hypertension: Secondary | ICD-10-CM | POA: Insufficient documentation

## 2012-10-30 NOTE — ED Notes (Signed)
Called Shirley [name] since she left without being seen by an MD & she did not inform us that she was leaving. She stated that she had to pick up her children and her leg was feeling better. Informed her that she could return and be evaluated by an MD if her leg pain continued

## 2012-10-30 NOTE — ED Notes (Signed)
Patient here with right upper leg soreness x 2 weeks. Reports that she doent think she has hit her leg but reports bruise to same that seems to be spreading

## 2012-12-22 ENCOUNTER — Emergency Department (HOSPITAL_BASED_OUTPATIENT_CLINIC_OR_DEPARTMENT_OTHER)
Admission: EM | Admit: 2012-12-22 | Discharge: 2012-12-22 | Disposition: A | Discharge status: Home / Self Care | Payer: Medicaid Other | Attending: Emergency Medicine | Admitting: Emergency Medicine

## 2012-12-22 ENCOUNTER — Emergency Department (HOSPITAL_BASED_OUTPATIENT_CLINIC_OR_DEPARTMENT_OTHER): Payer: Medicaid Other

## 2012-12-22 ENCOUNTER — Encounter (HOSPITAL_BASED_OUTPATIENT_CLINIC_OR_DEPARTMENT_OTHER): Payer: Self-pay | Admitting: Emergency Medicine

## 2012-12-22 DIAGNOSIS — Z872 Personal history of diseases of the skin and subcutaneous tissue: Secondary | ICD-10-CM | POA: Insufficient documentation

## 2012-12-22 DIAGNOSIS — F17200 Nicotine dependence, unspecified, uncomplicated: Secondary | ICD-10-CM | POA: Insufficient documentation

## 2012-12-22 DIAGNOSIS — I10 Essential (primary) hypertension: Secondary | ICD-10-CM | POA: Insufficient documentation

## 2012-12-22 DIAGNOSIS — D649 Anemia, unspecified: Secondary | ICD-10-CM | POA: Insufficient documentation

## 2012-12-22 DIAGNOSIS — R1084 Generalized abdominal pain: Secondary | ICD-10-CM | POA: Insufficient documentation

## 2012-12-22 DIAGNOSIS — Z3202 Encounter for pregnancy test, result negative: Secondary | ICD-10-CM | POA: Insufficient documentation

## 2012-12-22 DIAGNOSIS — K5900 Constipation, unspecified: Secondary | ICD-10-CM | POA: Insufficient documentation

## 2012-12-22 DIAGNOSIS — R109 Unspecified abdominal pain: Secondary | ICD-10-CM

## 2012-12-22 LAB — CBC WITH DIFFERENTIAL/PLATELET
Basophils Absolute: 0 10*3/uL (ref 0.0–0.1)
Basophils Relative: 1 % (ref 0–1)
Eosinophils Absolute: 0.1 10*3/uL (ref 0.0–0.7)
Eosinophils Relative: 3 % (ref 0–5)
HCT: 27.2 % — ABNORMAL LOW (ref 36.0–46.0)
Hemoglobin: 8.4 g/dL — ABNORMAL LOW (ref 12.0–15.0)
Lymphocytes Relative: 31 % (ref 12–46)
Lymphs Abs: 1.8 10*3/uL (ref 0.7–4.0)
MCH: 24.1 pg — ABNORMAL LOW (ref 26.0–34.0)
MCHC: 30.9 g/dL (ref 30.0–36.0)
MCV: 77.9 fL — ABNORMAL LOW (ref 78.0–100.0)
Monocytes Absolute: 0.4 10*3/uL (ref 0.1–1.0)
Monocytes Relative: 7 % (ref 3–12)
Neutro Abs: 3.4 10*3/uL (ref 1.7–7.7)
Neutrophils Relative %: 59 % (ref 43–77)
Platelets: 357 10*3/uL (ref 150–400)
RBC: 3.49 MIL/uL — ABNORMAL LOW (ref 3.87–5.11)
RDW: 18.2 % — ABNORMAL HIGH (ref 11.5–15.5)
WBC: 5.7 10*3/uL (ref 4.0–10.5)

## 2012-12-22 LAB — COMPREHENSIVE METABOLIC PANEL
ALT: 8 U/L (ref 0–35)
AST: 16 U/L (ref 0–37)
Albumin: 3.8 g/dL (ref 3.5–5.2)
Alkaline Phosphatase: 69 U/L (ref 39–117)
BUN: 4 mg/dL — ABNORMAL LOW (ref 6–23)
CO2: 25 mEq/L (ref 19–32)
Calcium: 9 mg/dL (ref 8.4–10.5)
Chloride: 104 mEq/L (ref 96–112)
Creatinine, Ser: 0.6 mg/dL (ref 0.50–1.10)
GFR calc Af Amer: >90 mL/min (ref >90)
GFR calc non Af Amer: >90 mL/min (ref >90)
Glucose, Bld: 94 mg/dL (ref 70–99)
Potassium: 4.1 mEq/L (ref 3.5–5.1)
Sodium: 138 mEq/L (ref 135–145)
Total Bilirubin: 0.4 mg/dL (ref 0.3–1.2)
Total Protein: 7.1 g/dL (ref 6.0–8.3)

## 2012-12-22 LAB — URINALYSIS, ROUTINE W REFLEX MICROSCOPIC
Bilirubin Urine: NEGATIVE
Glucose, UA: NEGATIVE mg/dL
Hgb urine dipstick: NEGATIVE
Ketones, ur: NEGATIVE mg/dL
Leukocytes, UA: NEGATIVE
Nitrite: NEGATIVE
Protein, ur: NEGATIVE mg/dL
Specific Gravity, Urine: 1.016 (ref 1.005–1.030)
Urobilinogen, UA: 0.2 mg/dL (ref 0.0–1.0)
pH: 7 (ref 5.0–8.0)

## 2012-12-22 LAB — PREGNANCY, URINE: Preg Test, Ur: NEGATIVE

## 2012-12-22 LAB — OCCULT BLOOD X 1 CARD TO LAB, STOOL: Fecal Occult Bld: NEGATIVE

## 2012-12-22 LAB — LIPASE, BLOOD: Lipase: 59 U/L (ref 11–59)

## 2012-12-22 MED ORDER — POLYETHYLENE GLYCOL 3350 17 G PO PACK: 17.0000 g | PACK | Freq: Every day | ORAL | Status: DC

## 2012-12-22 MED ORDER — FERROUS SULFATE 325 (65 FE) MG PO TABS: 325.0000 mg | ORAL_TABLET | Freq: Every day | ORAL | Status: DC

## 2012-12-22 MED ORDER — DICYCLOMINE HCL 20 MG PO TABS: 20.0000 mg | ORAL_TABLET | Freq: Two times a day (BID) | ORAL | Status: DC

## 2012-12-22 MED ORDER — GI COCKTAIL ~~LOC~~
30.0000 mL | Freq: Once | ORAL | Status: AC
  Administered 2012-12-22: 30 mL via ORAL
  Filled 2012-12-22: qty 30

## 2012-12-22 NOTE — ED Provider Notes (Signed)
CSN: [id number]     Arrival date & time 12/22/12  1027 History   First MD Initiated Contact with Patient 12/22/12 1133     Chief Complaint  Patient presents with  . Abdominal Pain   (Consider location/radiation/quality/duration/timing/severity/associated sxs/prior Treatment) Patient is a 27 y.o. female presenting with abdominal pain. The history is provided by the patient. No language interpreter was used.  Abdominal Pain Pain location:  Generalized Pain quality: aching and fullness   Associated symptoms: no chills, no constipation, no diarrhea, no dysuria, no fever, no nausea, no vaginal bleeding, no vaginal discharge and no vomiting   Associated symptoms comment:  Generalized abdominal discomfort, especially after eating, and that causes a sense of fullness and bloating. She reports regular bowel movements, "2 a day like usual", no melena or BRB per rectum. No nausea or vomiting. She denies fever. No dysuria or gynecologic symptoms.   Past Medical History  Diagnosis Date  . Eczema   . Hypertension    Past Surgical History  Procedure Laterality Date  . Cesarean section    . Wisdom tooth extraction    . Induced abortion     Family History  Problem Relation Age of Onset  . Other Neg Hx   . Hearing loss Neg Hx   . Cancer Maternal Grandmother     breast   History  Substance Use Topics  . Smoking status: Current Every Day Smoker -- 0.50 packs/day for 2 years    Types: Cigarettes  . Smokeless tobacco: Never Used  . Alcohol Use: No   OB History   Grav Para Term Preterm Abortions TAB SAB Ect Mult Living   6 3 1 2 2 2   1 4      Review of Systems  Constitutional: Negative for fever and chills.  HENT: Negative.   Respiratory: Negative.   Cardiovascular: Negative.   Gastrointestinal: Positive for abdominal pain. Negative for nausea, vomiting, diarrhea, constipation and blood in stool.  Genitourinary: Negative.  Negative for dysuria, vaginal bleeding and vaginal discharge.   Musculoskeletal: Negative.   Skin: Negative.   Neurological: Negative.     Allergies  Review of patient's allergies indicates no known allergies.  Home Medications  No current outpatient prescriptions on file. BP 136/96  Pulse 70  Temp(Src) 98.5 F (36.9 C) (Oral)  Resp 16  Ht 5\' 1"  (1.549 m)  Wt 146 lb (66.225 kg)  BMI 27.60 kg/m2  SpO2 100%  LMP 12/06/2012 Physical Exam  Constitutional: She is oriented to person, place, and time. She appears well-developed and well-nourished.  HENT:  Head: Normocephalic.  Neck: Normal range of motion. Neck supple.  Cardiovascular: Normal rate and regular rhythm.   Pulmonary/Chest: Effort normal and breath sounds normal.  Abdominal: Soft. Bowel sounds are normal. She exhibits no distension and no mass. There is tenderness. There is no rebound and no guarding.  Mild generalized tenderness to soft abdomen.  Musculoskeletal: Normal range of motion.  Neurological: She is alert and oriented to person, place, and time.  Skin: Skin is warm and dry. No rash noted.  Psychiatric: She has a normal mood and affect.    ED Course  Procedures (including critical care time) Labs Review Labs Reviewed  CBC WITH DIFFERENTIAL - Abnormal; Notable for the following:    RBC 3.49 (*)    Hemoglobin 8.4 (*)    HCT 27.2 (*)    MCV 77.9 (*)    MCH 24.1 (*)    RDW 18.2 (*)    All  other components within normal limits  PREGNANCY, URINE  URINALYSIS, ROUTINE W REFLEX MICROSCOPIC  COMPREHENSIVE METABOLIC PANEL  LIPASE, BLOOD   Results for orders placed during the hospital encounter of 12/22/12  PREGNANCY, URINE      Result Value Range   Preg Test, Ur NEGATIVE  NEGATIVE  URINALYSIS, ROUTINE W REFLEX MICROSCOPIC      Result Value Range   Color, Urine YELLOW  YELLOW   APPearance CLEAR  CLEAR   Specific Gravity, Urine 1.016  1.005 - 1.030   pH 7.0  5.0 - 8.0   Glucose, UA NEGATIVE  NEGATIVE mg/dL   Hgb urine dipstick NEGATIVE  NEGATIVE   Bilirubin Urine  NEGATIVE  NEGATIVE   Ketones, ur NEGATIVE  NEGATIVE mg/dL   Protein, ur NEGATIVE  NEGATIVE mg/dL   Urobilinogen, UA 0.2  0.0 - 1.0 mg/dL   Nitrite NEGATIVE  NEGATIVE   Leukocytes, UA NEGATIVE  NEGATIVE  CBC WITH DIFFERENTIAL      Result Value Range   WBC 5.7  4.0 - 10.5 K/uL   RBC 3.49 (*) 3.87 - 5.11 MIL/uL   Hemoglobin 8.4 (*) 12.0 - 15.0 g/dL   HCT 78.2 (*) 95.6 - 21.3 %   MCV 77.9 (*) 78.0 - 100.0 fL   MCH 24.1 (*) 26.0 - 34.0 pg   MCHC 30.9  30.0 - 36.0 g/dL   RDW 08.6 (*) 57.8 - 46.9 %   Platelets 357  150 - 400 K/uL   Neutrophils Relative % 59  43 - 77 %   Neutro Abs 3.4  1.7 - 7.7 K/uL   Lymphocytes Relative 31  12 - 46 %   Lymphs Abs 1.8  0.7 - 4.0 K/uL   Monocytes Relative 7  3 - 12 %   Monocytes Absolute 0.4  0.1 - 1.0 K/uL   Eosinophils Relative 3  0 - 5 %   Eosinophils Absolute 0.1  0.0 - 0.7 K/uL   Basophils Relative 1  0 - 1 %   Basophils Absolute 0.0  0.0 - 0.1 K/uL  COMPREHENSIVE METABOLIC PANEL      Result Value Range   Sodium 138  135 - 145 mEq/L   Potassium 4.1  3.5 - 5.1 mEq/L   Chloride 104  96 - 112 mEq/L   CO2 25  19 - 32 mEq/L   Glucose, Bld 94  70 - 99 mg/dL   BUN 4 (*) 6 - 23 mg/dL   Creatinine, Ser 6.29  0.50 - 1.10 mg/dL   Calcium 9.0  8.4 - 52.8 mg/dL   Total Protein 7.1  6.0 - 8.3 g/dL   Albumin 3.8  3.5 - 5.2 g/dL   AST 16  0 - 37 U/L   ALT 8  0 - 35 U/L   Alkaline Phosphatase 69  39 - 117 U/L   Total Bilirubin 0.4  0.3 - 1.2 mg/dL   GFR calc non Af Amer >90  >90 mL/min   GFR calc Af Amer >90  >90 mL/min  LIPASE, BLOOD      Result Value Range   Lipase 59  11 - 59 U/L  OCCULT BLOOD X 1 CARD TO LAB, STOOL      Result Value Range   Fecal Occult Bld NEGATIVE  NEGATIVE    Imaging Review No results found.  EKG Interpretation   None       MDM  No diagnosis found. 1. Abdominal pain 2. Anemia 3. Constipation  Lab studies essentially normal  with exception of low hemoglobin. She is guaiac negative. VSS. Abdominal exam  essentially benign, minimally tender. X-ray show moderate stool burden. Suggest Miralax, iron supplementation and bentyl prn. PCP follow up.    Arnoldo Hooker, PA-C 12/22/12 1329

## 2012-12-22 NOTE — ED Notes (Signed)
Pt reports abdominal pain x 2 weeks and pain is associated with eating.  Pt reports she was incarcerated, not eating well, and concerned this abdominal pain is related to not eating well.  She reports urinary frequency x 1 week.

## 2012-12-22 NOTE — ED Provider Notes (Signed)
Medical screening examination/treatment/procedure(s) were performed by non-physician practitioner and as supervising physician I was immediately available for consultation/collaboration.    Celene Kras, MD 12/22/12 [phone]

## 2012-12-29 ENCOUNTER — Emergency Department (HOSPITAL_BASED_OUTPATIENT_CLINIC_OR_DEPARTMENT_OTHER)
Admission: EM | Admit: 2012-12-29 | Discharge: 2012-12-29 | Disposition: A | Discharge status: Home / Self Care | Payer: Medicaid Other | Attending: Emergency Medicine | Admitting: Emergency Medicine

## 2012-12-29 ENCOUNTER — Emergency Department (HOSPITAL_BASED_OUTPATIENT_CLINIC_OR_DEPARTMENT_OTHER): Payer: Medicaid Other

## 2012-12-29 ENCOUNTER — Encounter (HOSPITAL_BASED_OUTPATIENT_CLINIC_OR_DEPARTMENT_OTHER): Payer: Self-pay | Admitting: Emergency Medicine

## 2012-12-29 DIAGNOSIS — Z79899 Other long term (current) drug therapy: Secondary | ICD-10-CM | POA: Insufficient documentation

## 2012-12-29 DIAGNOSIS — M25519 Pain in unspecified shoulder: Secondary | ICD-10-CM | POA: Insufficient documentation

## 2012-12-29 DIAGNOSIS — M25511 Pain in right shoulder: Secondary | ICD-10-CM

## 2012-12-29 DIAGNOSIS — Z872 Personal history of diseases of the skin and subcutaneous tissue: Secondary | ICD-10-CM | POA: Insufficient documentation

## 2012-12-29 DIAGNOSIS — I10 Essential (primary) hypertension: Secondary | ICD-10-CM | POA: Insufficient documentation

## 2012-12-29 DIAGNOSIS — F17200 Nicotine dependence, unspecified, uncomplicated: Secondary | ICD-10-CM | POA: Insufficient documentation

## 2012-12-29 MED ORDER — TRAMADOL HCL 50 MG PO TABS
50.0000 mg | ORAL_TABLET | Freq: Once | ORAL | Status: AC
  Administered 2012-12-29: 50 mg via ORAL
  Filled 2012-12-29: qty 1

## 2012-12-29 MED ORDER — TRAMADOL HCL 50 MG PO TABS: 50.0000 mg | ORAL_TABLET | Freq: Four times a day (QID) | ORAL | Status: DC | PRN

## 2012-12-29 NOTE — ED Notes (Signed)
Pt sts pain in R shoulder blade, radiates down R arm. Also sts blurred vision and "feels funny" x 2 days. Denies shob, fever, vomiting or diarrhea.

## 2012-12-29 NOTE — ED Provider Notes (Signed)
CSN: [id number]     Arrival date & time 12/29/12  0043 History   First MD Initiated Contact with Patient 12/29/12 0225     Chief Complaint  Patient presents with  . Back Pain   (Consider location/radiation/quality/duration/timing/severity/associated sxs/prior Treatment) HPI Comments: Patient is a 27 year old female presents with complaints of pain in the posterior aspect of her right shoulder for the past 2 days. She states that it radiates down her right arm but she denies any weakness or numbness of the hand. She denies any chest pain or shortness of breath. She denies having any injury or trauma.  Patient is a 27 y.o. female presenting with shoulder pain. The history is provided by the patient.  Shoulder Pain This is a new problem. The current episode started 2 days ago. The problem occurs constantly. The problem has been rapidly worsening. Pertinent negatives include no chest pain and no abdominal pain. Nothing aggravates the symptoms. Nothing relieves the symptoms. She has tried nothing for the symptoms. The treatment provided no relief.    Past Medical History  Diagnosis Date  . Eczema   . Hypertension    Past Surgical History  Procedure Laterality Date  . Cesarean section    . Wisdom tooth extraction    . Induced abortion     Family History  Problem Relation Age of Onset  . Other Neg Hx   . Hearing loss Neg Hx   . Cancer Maternal Grandmother     breast   History  Substance Use Topics  . Smoking status: Current Every Day Smoker -- 0.50 packs/day for 2 years    Types: Cigarettes  . Smokeless tobacco: Never Used  . Alcohol Use: No   OB History   Grav Para Term Preterm Abortions TAB SAB Ect Mult Living   6 3 1 2 2 2   1 4      Review of Systems  Cardiovascular: Negative for chest pain.  Gastrointestinal: Negative for abdominal pain.  All other systems reviewed and are negative.    Allergies  Review of patient's allergies indicates no known allergies.  Home  Medications   Current Outpatient Rx  Name  Route  Sig  Dispense  Refill  . dicyclomine (BENTYL) 20 MG tablet   Oral   Take 1 tablet (20 mg total) by mouth 2 (two) times daily.   20 tablet   0   . ferrous sulfate 325 (65 FE) MG tablet   Oral   Take 1 tablet (325 mg total) by mouth daily.   30 tablet   0   . polyethylene glycol (MIRALAX) packet   Oral   Take 17 g by mouth daily.   3 each   0   . traMADol (ULTRAM) 50 MG tablet   Oral   Take 1 tablet (50 mg total) by mouth every 6 (six) hours as needed.   15 tablet   0    BP 118/76  Pulse 84  Temp(Src) 98.7 F (37.1 C) (Oral)  Resp 16  Ht 5\' 1"  (1.549 m)  Wt 147 lb (66.679 kg)  BMI 27.79 kg/m2  SpO2 97%  LMP 12/06/2012 Physical Exam  Nursing note and vitals reviewed. Constitutional: She is oriented to person, place, and time. She appears well-developed and well-nourished. No distress.  HENT:  Head: Normocephalic and atraumatic.  Mouth/Throat: Oropharynx is clear and moist.  Neck: Normal range of motion. Neck supple.  Musculoskeletal: Normal range of motion.  The right shoulder appears grossly normal. There  is tenderness to palpation over the posterior aspect. This strength is 5 out of 5 in the bilateral upper extremities and older and radial pulses are symmetrical and easily palpable. Is able to oppose, abduct, and adduct all of the fingers on both hands  Neurological: She is alert and oriented to person, place, and time.  Skin: Skin is warm and dry. She is not diaphoretic.    ED Course  Procedures (including critical care time) Labs Review Labs Reviewed - No data to display Imaging Review Dg Shoulder Right  12/29/2012   CLINICAL DATA:  Right shoulder pain.  EXAM: RIGHT SHOULDER - 2+ VIEW  COMPARISON:  None.  FINDINGS: There is no evidence of fracture or dislocation. The right humeral head is seated within the glenoid fossa. The acromioclavicular joint is unremarkable in appearance. No significant soft tissue  abnormalities are seen. The visualized portions of the right lung are clear.  IMPRESSION: No evidence of fracture or dislocation.   Electronically Signed   By: Roanna Raider M.D.   On: 12/29/2012 04:02      MDM   1. Left shoulder pain    This appears to be a radicular shoulder pain. She will be treated with anti-inflammatories and tramadol. To return as needed if she develops further problems.    Geoffery Lyons, MD 12/29/12 [phone]

## 2013-03-12 ENCOUNTER — Emergency Department (HOSPITAL_BASED_OUTPATIENT_CLINIC_OR_DEPARTMENT_OTHER)
Admission: EM | Admit: 2013-03-12 | Discharge: 2013-03-12 | Disposition: A | Discharge status: Home / Self Care | Payer: Medicaid Other | Attending: Emergency Medicine | Admitting: Emergency Medicine

## 2013-03-12 ENCOUNTER — Encounter (HOSPITAL_BASED_OUTPATIENT_CLINIC_OR_DEPARTMENT_OTHER): Payer: Self-pay | Admitting: Emergency Medicine

## 2013-03-12 DIAGNOSIS — Z872 Personal history of diseases of the skin and subcutaneous tissue: Secondary | ICD-10-CM | POA: Insufficient documentation

## 2013-03-12 DIAGNOSIS — N764 Abscess of vulva: Secondary | ICD-10-CM

## 2013-03-12 DIAGNOSIS — F17200 Nicotine dependence, unspecified, uncomplicated: Secondary | ICD-10-CM | POA: Insufficient documentation

## 2013-03-12 DIAGNOSIS — Z79899 Other long term (current) drug therapy: Secondary | ICD-10-CM | POA: Insufficient documentation

## 2013-03-12 DIAGNOSIS — R110 Nausea: Secondary | ICD-10-CM | POA: Insufficient documentation

## 2013-03-12 DIAGNOSIS — I10 Essential (primary) hypertension: Secondary | ICD-10-CM | POA: Insufficient documentation

## 2013-03-12 DIAGNOSIS — Z3202 Encounter for pregnancy test, result negative: Secondary | ICD-10-CM | POA: Insufficient documentation

## 2013-03-12 LAB — URINALYSIS, ROUTINE W REFLEX MICROSCOPIC
Bilirubin Urine: NEGATIVE
Glucose, UA: NEGATIVE mg/dL
Hgb urine dipstick: NEGATIVE
Ketones, ur: NEGATIVE mg/dL
Nitrite: NEGATIVE
Protein, ur: NEGATIVE mg/dL
Specific Gravity, Urine: 1.009 (ref 1.005–1.030)
Urobilinogen, UA: 1 mg/dL (ref 0.0–1.0)
pH: 8 (ref 5.0–8.0)

## 2013-03-12 LAB — URINE MICROSCOPIC-ADD ON

## 2013-03-12 LAB — PREGNANCY, URINE: Preg Test, Ur: NEGATIVE

## 2013-03-12 MED ORDER — SULFAMETHOXAZOLE-TRIMETHOPRIM 800-160 MG PO TABS: 1.0000 | ORAL_TABLET | Freq: Two times a day (BID) | ORAL | Status: AC

## 2013-03-12 MED ORDER — HYDROCODONE-ACETAMINOPHEN 5-325 MG PO TABS: 1.0000 | ORAL_TABLET | Freq: Four times a day (QID) | ORAL | Status: DC | PRN

## 2013-03-12 NOTE — ED Notes (Addendum)
Reports onset of an abscess in her right groin area.  Also reporting lower abdominal pain.  Is sexually active with a monogamous female partner.  Does not use protection.  Denies abnormal vaginal bleeding, c/o some abnormal discharge.

## 2013-03-12 NOTE — ED Provider Notes (Signed)
CSN: [id number]     Arrival date & time 03/12/13  2002 History   First MD Initiated Contact with Patient 03/12/13 2140     Chief Complaint  Patient presents with  . Abscess     (Consider location/radiation/quality/duration/timing/severity/associated sxs/prior Treatment) Patient is a 28 y.o. female presenting with abscess. The history is provided by the patient.  Abscess Location:  Ano-genital Ano-genital abscess location:  Vagina Abscess quality: painful and warmth   Abscess quality: not draining   Red streaking: no   Duration:  3 days Progression:  Worsening Chronicity:  New Context: not diabetes, not insect bite/sting and not skin injury   Relieved by:  Nothing Worsened by:  Draining/squeezing Ineffective treatments:  Draining/squeezing, warm water soaks and warm compresses Associated symptoms: nausea   Associated symptoms: no anorexia, no fatigue, no fever, no headaches and no vomiting   Risk factors: prior abscess   Risk factors: no hx of MRSA    Shirley [name] is a 28 y.o. female who presents to the ED with pain in the right labia that started a few days ago. She has had some vaginal discharge but had pelvic exam pap smear and cultures done recently that were all normal.    Past Medical History  Diagnosis Date  . Eczema   . Hypertension    Past Surgical History  Procedure Laterality Date  . Cesarean section    . Wisdom tooth extraction    . Induced abortion     Family History  Problem Relation Age of Onset  . Other Neg Hx   . Hearing loss Neg Hx   . Cancer Maternal Grandmother     breast   History  Substance Use Topics  . Smoking status: Current Every Day Smoker -- 0.50 packs/day for 2 years    Types: Cigarettes  . Smokeless tobacco: Never Used  . Alcohol Use: No   OB History   Grav Para Term Preterm Abortions TAB SAB Ect Mult Living   6 3 1 2 2 2   1 4      Review of Systems  Constitutional: Negative for fever and fatigue.  HENT: Negative.   Eyes:  Negative for visual disturbance.  Respiratory: Negative for cough and shortness of breath.   Cardiovascular: Negative for chest pain.  Gastrointestinal: Positive for nausea. Negative for vomiting, abdominal pain and anorexia.  Genitourinary: Positive for vaginal discharge and vaginal pain. Negative for dysuria, urgency and vaginal bleeding.  Musculoskeletal: Negative for myalgias.  Skin: Negative for rash.  Neurological: Negative for headaches.  Psychiatric/Behavioral: The patient is not nervous/anxious.       Allergies  Review of patient's allergies indicates no known allergies.  Home Medications   Current Outpatient Rx  Name  Route  Sig  Dispense  Refill  . dicyclomine (BENTYL) 20 MG tablet   Oral   Take 1 tablet (20 mg total) by mouth 2 (two) times daily.   20 tablet   0   . ferrous sulfate 325 (65 FE) MG tablet   Oral   Take 1 tablet (325 mg total) by mouth daily.   30 tablet   0   . polyethylene glycol (MIRALAX) packet   Oral   Take 17 g by mouth daily.   3 each   0   . traMADol (ULTRAM) 50 MG tablet   Oral   Take 1 tablet (50 mg total) by mouth every 6 (six) hours as needed.   15 tablet   0  BP 130/77  Pulse 88  Temp(Src) 98.9 F (37.2 C) (Oral)  Resp 16  Ht 5' (1.524 m)  Wt 145 lb (65.772 kg)  BMI 28.32 kg/m2  SpO2 100%  LMP 02/25/2013 Physical Exam  Nursing note and vitals reviewed. Constitutional: She is oriented to person, place, and time. She appears well-developed and well-nourished.  Eyes: EOM are normal.  Neck: Neck supple.  Cardiovascular: Normal rate.   Pulmonary/Chest: Effort normal.  Abdominal: Soft.  Genitourinary:     BB size cystic area palpated in the right labia. Tender with exam.  Musculoskeletal: Normal range of motion.  Neurological: She is alert and oriented to person, place, and time. No cranial nerve deficit.  Skin: Skin is warm and dry.  Psychiatric: She has a normal mood and affect. Her behavior is normal.     ED Course  Procedures Results for orders placed during the hospital encounter of 03/12/13 (from the past 24 hour(s))  URINALYSIS, ROUTINE W REFLEX MICROSCOPIC     Status: Abnormal   Collection Time    03/12/13  8:11 PM      Result Value Ref Range   Color, Urine YELLOW  YELLOW   APPearance CLEAR  CLEAR   Specific Gravity, Urine 1.009  1.005 - 1.030   pH 8.0  5.0 - 8.0   Glucose, UA NEGATIVE  NEGATIVE mg/dL   Hgb urine dipstick NEGATIVE  NEGATIVE   Bilirubin Urine NEGATIVE  NEGATIVE   Ketones, ur NEGATIVE  NEGATIVE mg/dL   Protein, ur NEGATIVE  NEGATIVE mg/dL   Urobilinogen, UA 1.0  0.0 - 1.0 mg/dL   Nitrite NEGATIVE  NEGATIVE   Leukocytes, UA TRACE (*) NEGATIVE  PREGNANCY, URINE     Status: None   Collection Time    03/12/13  8:11 PM      Result Value Ref Range   Preg Test, Ur NEGATIVE  NEGATIVE  URINE MICROSCOPIC-ADD ON     Status: Abnormal   Collection Time    03/12/13  8:11 PM      Result Value Ref Range   Squamous Epithelial / LPF FEW (*) RARE   WBC, UA 0-2  <3 WBC/hpf   Bacteria, UA RARE  RARE    MDM  28 y.o. female with small cystic like area right labia. Possible early abscess. Will treat with antibiotics and pain medication and she will use sitz baths. She will follow up with her GYN or return here as needed. Discussed with the patient and all questioned fully answered.    Medication List    TAKE these medications       HYDROcodone-acetaminophen 5-325 MG per tablet  Commonly known as:  NORCO  Take 1 tablet by mouth every 6 (six) hours as needed for moderate pain.     sulfamethoxazole-trimethoprim 800-160 MG per tablet  Commonly known as:  BACTRIM DS,SEPTRA DS  Take 1 tablet by mouth 2 (two) times daily.      ASK your doctor about these medications       dicyclomine 20 MG tablet  Commonly known as:  BENTYL  Take 1 tablet (20 mg total) by mouth 2 (two) times daily.     ferrous sulfate 325 (65 FE) MG tablet  Take 1 tablet (325 mg total) by mouth daily.      polyethylene glycol packet  Commonly known as:  MIRALAX  Take 17 g by mouth daily.     traMADol 50 MG tablet  Commonly known as:  ULTRAM  Take 1 tablet (  50 mg total) by mouth every 6 (six) hours as needed.          [place], [place] 03/12/13 2328

## 2013-03-12 NOTE — ED Provider Notes (Signed)
Medical screening examination/treatment/procedure(s) were performed by non-physician practitioner and as supervising physician I was immediately available for consultation/collaboration.   EKG Interpretation None       Orlie Dakin, MD 03/12/13 [phone]

## 2013-05-29 ENCOUNTER — Emergency Department (HOSPITAL_BASED_OUTPATIENT_CLINIC_OR_DEPARTMENT_OTHER)
Admission: EM | Admit: 2013-05-29 | Discharge: 2013-05-29 | Disposition: A | Discharge status: Home / Self Care | Payer: Medicaid Other | Attending: Emergency Medicine | Admitting: Emergency Medicine

## 2013-05-29 ENCOUNTER — Encounter (HOSPITAL_BASED_OUTPATIENT_CLINIC_OR_DEPARTMENT_OTHER): Payer: Self-pay | Admitting: Emergency Medicine

## 2013-05-29 DIAGNOSIS — Z3202 Encounter for pregnancy test, result negative: Secondary | ICD-10-CM | POA: Insufficient documentation

## 2013-05-29 DIAGNOSIS — I10 Essential (primary) hypertension: Secondary | ICD-10-CM | POA: Insufficient documentation

## 2013-05-29 DIAGNOSIS — N739 Female pelvic inflammatory disease, unspecified: Secondary | ICD-10-CM | POA: Insufficient documentation

## 2013-05-29 DIAGNOSIS — F17200 Nicotine dependence, unspecified, uncomplicated: Secondary | ICD-10-CM | POA: Insufficient documentation

## 2013-05-29 DIAGNOSIS — N12 Tubulo-interstitial nephritis, not specified as acute or chronic: Secondary | ICD-10-CM | POA: Insufficient documentation

## 2013-05-29 DIAGNOSIS — Z79899 Other long term (current) drug therapy: Secondary | ICD-10-CM | POA: Insufficient documentation

## 2013-05-29 DIAGNOSIS — N898 Other specified noninflammatory disorders of vagina: Secondary | ICD-10-CM | POA: Insufficient documentation

## 2013-05-29 DIAGNOSIS — Z872 Personal history of diseases of the skin and subcutaneous tissue: Secondary | ICD-10-CM | POA: Insufficient documentation

## 2013-05-29 LAB — URINALYSIS, ROUTINE W REFLEX MICROSCOPIC
Bilirubin Urine: NEGATIVE
Glucose, UA: NEGATIVE mg/dL
Ketones, ur: NEGATIVE mg/dL
Nitrite: NEGATIVE
Protein, ur: 100 mg/dL — AB
Specific Gravity, Urine: 1.013 (ref 1.005–1.030)
Urobilinogen, UA: 1 mg/dL (ref 0.0–1.0)
pH: 7.5 (ref 5.0–8.0)

## 2013-05-29 LAB — PREGNANCY, URINE: Preg Test, Ur: NEGATIVE

## 2013-05-29 LAB — WET PREP, GENITAL
Trich, Wet Prep: NONE SEEN
Yeast Wet Prep HPF POC: NONE SEEN

## 2013-05-29 LAB — URINE MICROSCOPIC-ADD ON

## 2013-05-29 MED ORDER — NAPROXEN 375 MG PO TABS: 375.0000 mg | ORAL_TABLET | Freq: Two times a day (BID) | ORAL | Status: DC

## 2013-05-29 MED ORDER — AZITHROMYCIN 250 MG PO TABS
1000.0000 mg | ORAL_TABLET | Freq: Once | ORAL | Status: AC
  Administered 2013-05-29: 1000 mg via ORAL
  Filled 2013-05-29: qty 4

## 2013-05-29 MED ORDER — CIPROFLOXACIN HCL 500 MG PO TABS: 500.0000 mg | ORAL_TABLET | Freq: Two times a day (BID) | ORAL | Status: DC

## 2013-05-29 MED ORDER — CEFTRIAXONE SODIUM 250 MG IJ SOLR
250.0000 mg | Freq: Once | INTRAMUSCULAR | Status: AC
  Administered 2013-05-29: 250 mg via INTRAMUSCULAR
  Filled 2013-05-29: qty 250

## 2013-05-29 MED ORDER — DOXYCYCLINE HYCLATE 100 MG PO CAPS: ORAL_CAPSULE | ORAL | Status: DC

## 2013-05-29 MED ORDER — KETOROLAC TROMETHAMINE 60 MG/2ML IM SOLN
60.0000 mg | Freq: Once | INTRAMUSCULAR | Status: AC
  Administered 2013-05-29: 60 mg via INTRAMUSCULAR
  Filled 2013-05-29: qty 2

## 2013-05-29 MED ORDER — ACETAMINOPHEN 325 MG PO TABS: 650.0000 mg | ORAL_TABLET | Freq: Once | ORAL | Status: DC

## 2013-05-29 NOTE — ED Notes (Signed)
Pelvic cart is at the bedside set up and ready for the doctor to use. 

## 2013-05-29 NOTE — ED Provider Notes (Signed)
CSN: [id number]     Arrival date & time 05/29/13  1818 History   First MD Initiated Contact with Patient 05/29/13 2000     Chief Complaint  Patient presents with  . Dysuria     (Consider location/radiation/quality/duration/timing/severity/associated sxs/prior Treatment) The history is provided by the patient. No language interpreter was used.  Shirley [name] is a 28 year old female with past medical history of hypertension, eczema presenting to the ED with dysuria that started on Friday. Patient reports that she's been having increased frequency and urgency to go to the bathroom. Stated that she started to experience left-sided abdominal pain described as a pressure, stabbing sensation without radiation that started earlier today. Stated that she has not been using anything for pain. Reported that she is sexually active, does not use protection. Last mental cycle was on 05/15/2013 reported that her cycles are normal. Reported mild headache earlier today. Denied fever, chills, chest pain, shortness of breath, difficulty breathing, nausea, vomiting, diarrhea, melena, hematochezia, vaginal discharge, vaginal pain, dizziness, flank pain, back pain. PCP Dr. Ruthann Cancer  Past Medical History  Diagnosis Date  . Eczema   . Hypertension    Past Surgical History  Procedure Laterality Date  . Cesarean section    . Wisdom tooth extraction    . Induced abortion     Family History  Problem Relation Age of Onset  . Other Neg Hx   . Hearing loss Neg Hx   . Cancer Maternal Grandmother     breast   History  Substance Use Topics  . Smoking status: Current Every Day Smoker -- 0.50 packs/day for 2 years    Types: Cigarettes  . Smokeless tobacco: Never Used  . Alcohol Use: No   OB History   Grav Para Term Preterm Abortions TAB SAB Ect Mult Living   6 3 1 2 2 2   1 4      Review of Systems  Constitutional: Negative for fever and chills.  Gastrointestinal: Positive for abdominal pain. Negative for  nausea, vomiting, diarrhea, constipation, blood in stool and anal bleeding.  Genitourinary: Positive for dysuria, frequency, hematuria, vaginal bleeding and pelvic pain. Negative for decreased urine volume, vaginal discharge and vaginal pain.  Musculoskeletal: Negative for back pain and neck pain.  Neurological: Negative for dizziness, weakness and numbness.  All other systems reviewed and are negative.     Allergies  Review of patient's allergies indicates no known allergies.  Home Medications   Prior to Admission medications   Medication Sig Start Date End Date Taking? Authorizing Provider  ferrous sulfate 325 (65 FE) MG tablet Take 1 tablet (325 mg total) by mouth daily. 12/22/12   Shari A Upstill, PA-C   BP 137/89  Pulse 77  Temp(Src) 98.9 F (37.2 C) (Oral)  Resp 16  Ht 5' (1.524 m)  Wt 145 lb (65.772 kg)  BMI 28.32 kg/m2  SpO2 100% Physical Exam  Nursing note and vitals reviewed. Constitutional: She is oriented to person, place, and time. She appears well-developed and well-nourished. No distress.  HENT:  Head: Normocephalic and atraumatic.  Mouth/Throat: No oropharyngeal exudate.  Eyes: Conjunctivae and EOM are normal. Pupils are equal, round, and reactive to light. Right eye exhibits no discharge. Left eye exhibits no discharge.  Neck: Normal range of motion. Neck supple. No tracheal deviation present.  Negative neck stiffness Negative nuchal rigidity Negative cervical lymphadenopathy Negative meningeal signs  Cardiovascular: Normal rate, regular rhythm and normal heart sounds.  Exam reveals no friction rub.  No murmur heard. Pulses:      Radial pulses are 2+ on the right side, and 2+ on the left side.  Pulmonary/Chest: Effort normal and breath sounds normal. No respiratory distress. She has no wheezes. She has no rales.  Abdominal: Soft. Normal appearance and bowel sounds are normal. She exhibits no distension. There is tenderness in the right lower quadrant,  suprapubic area and left lower quadrant. There is CVA tenderness (left). There is no rebound and no guarding.  Negative abdominal distention Bowel sounds normoactive in all 4 quadrants Abdomen soft upon palpation Mild discomfort upon palpation to the right low quadrant, suprapubic, left lower quadrant-most discomfort upon palpation to left lower quadrant Negative peritoneal signs Negative guarding or rigidity noted.  Genitourinary:  Pelvic exam: Negative swelling, erythema, inflammation, lesions, sores, deformities identified to the external genitalia. Negative swelling, erythema, formation, lesions, sores, masses, polyps identified vaginal canal. Negative bright red blood in the vaginal fold. Thin white discharge identified. Cervix with negative swelling, erythema identified-negative friability. Mild CMT noted. Lateral adnexal tenderness noted left more so than right.  Musculoskeletal: Normal range of motion.  Lymphadenopathy:    She has no cervical adenopathy.  Neurological: She is alert and oriented to person, place, and time. No cranial nerve deficit. She exhibits normal muscle tone. Coordination normal.  Skin: Skin is warm and dry. No rash noted. She is not diaphoretic. No erythema.  Psychiatric: She has a normal mood and affect. Her behavior is normal. Thought content normal.    ED Course  Procedures (including critical care time)  Results for orders placed during the hospital encounter of 05/29/13  WET PREP, GENITAL      Result Value Ref Range   Yeast Wet Prep HPF POC NONE SEEN  NONE SEEN   Trich, Wet Prep NONE SEEN  NONE SEEN   Clue Cells Wet Prep HPF POC FEW (*) NONE SEEN   WBC, Wet Prep HPF POC FEW (*) NONE SEEN  URINALYSIS, ROUTINE W REFLEX MICROSCOPIC      Result Value Ref Range   Color, Urine YELLOW  YELLOW   APPearance CLOUDY (*) CLEAR   Specific Gravity, Urine 1.013  1.005 - 1.030   pH 7.5  5.0 - 8.0   Glucose, UA NEGATIVE  NEGATIVE mg/dL   Hgb urine dipstick LARGE  (*) NEGATIVE   Bilirubin Urine NEGATIVE  NEGATIVE   Ketones, ur NEGATIVE  NEGATIVE mg/dL   Protein, ur 100 (*) NEGATIVE mg/dL   Urobilinogen, UA 1.0  0.0 - 1.0 mg/dL   Nitrite NEGATIVE  NEGATIVE   Leukocytes, UA LARGE (*) NEGATIVE  PREGNANCY, URINE      Result Value Ref Range   Preg Test, Ur NEGATIVE  NEGATIVE  URINE MICROSCOPIC-ADD ON      Result Value Ref Range   Squamous Epithelial / LPF FEW (*) RARE   WBC, UA TOO NUMEROUS TO COUNT  <3 WBC/hpf   RBC / HPF 21-50  <3 RBC/hpf   Bacteria, UA MANY (*) RARE    Labs Review Labs Reviewed  WET PREP, GENITAL - Abnormal; Notable for the following:    Clue Cells Wet Prep HPF POC FEW (*)    WBC, Wet Prep HPF POC FEW (*)    All other components within normal limits  URINALYSIS, ROUTINE W REFLEX MICROSCOPIC - Abnormal; Notable for the following:    APPearance CLOUDY (*)    Hgb urine dipstick LARGE (*)    Protein, ur 100 (*)    Leukocytes, UA LARGE (*)  All other components within normal limits  URINE MICROSCOPIC-ADD ON - Abnormal; Notable for the following:    Squamous Epithelial / LPF FEW (*)    Bacteria, UA MANY (*)    All other components within normal limits  URINE CULTURE  GC/CHLAMYDIA PROBE AMP  PREGNANCY, URINE    Imaging Review No results found.   EKG Interpretation None      MDM   Final diagnoses:  Pyelonephritis  PID (pelvic inflammatory disease)    Medications  ketorolac (TORADOL) injection 60 mg (60 mg Intramuscular Given 05/29/13 2103)  azithromycin (ZITHROMAX) tablet 1,000 mg (1,000 mg Oral Given 05/29/13 2209)  cefTRIAXone (ROCEPHIN) injection 250 mg (250 mg Intramuscular Given 05/29/13 2209)   Filed Vitals:   05/29/13 1837 05/29/13 2208  BP: 144/81 137/89  Pulse: 91 77  Temp: 99.3 F (37.4 C) 98.9 F (37.2 C)  TempSrc: Oral Oral  Resp: 18 16  Height: 5' (1.524 m)   Weight: 145 lb (65.772 kg)   SpO2: 100% 100%   Urine pregnancy. Urinalysis noted large hemoglobin with large leukocytes with  white blood cells too many to count. Urine culture pending. GC Chlamydia probe pending. Wet prep noted few clue cells and a few white blood cells. Doubt appendicitis. Doubt acute abdominal processes. Abdomen exam nonsurgical-negative peritoneal or rigidity noted. Most discomfort upon palpation to the pelvis. Doubt TOA. Doubt ectopic pregnancy. Mild CMT tenderness noted upon exam. Patient treated prophylactically for STDs. Will treat patient for pyelonephritis and PID - patient has history of STD exposure. Patient stable, afebrile. Discharged patient. Discharged patient with antibiotics. Discussed with patient to rest and stay hydrated. Discussed with patient to avoid any sexual activity. Discussed with patient to closely monitor symptoms and if symptoms are to worsen or change to report back to the ED - strict return instructions given.  Patient agreed to plan of care, understood, all questions answered.   Jamse Mead, PA-C 05/30/13 [phone]

## 2013-05-29 NOTE — ED Notes (Signed)
Patient here with dysuria, urinary frequency and left side pain x 2 days. Reports mild hematuria as well

## 2013-05-29 NOTE — Discharge Instructions (Signed)
Please call your doctor for a followup appointment within 24-48 hours. When you talk to your doctor please let them know that you were seen in the emergency department and have them acquire all of your records so that they can discuss the findings with you and formulate a treatment plan to fully care for your new and ongoing problems. Please call and set up an appointment with your primary care provider to be reassessed Please rest and stay hydrated Please take medications as prescribed and on a full stomach Please avoid any physical or strenuous activity Please avoid any sexual activity until infection has been cleared Please continue to monitor symptoms closely and if symptoms are to worsen or change (fever greater than 101, chills, chest pain, shortness of breath, difficulty breathing, numbness, tingling, worsening or changes to abdominal pain, back pain, blood in the stools, black tarry stools, down, neck pain, neck stiffness, worsening changes to pain, pus in the urine, vaginal discharge, vaginal bleeding) please report back to the ED immediately   Pelvic Inflammatory Disease Pelvic inflammatory disease (PID) is an infection in some or all of the female organs. PID can be in the uterus, ovaries, fallopian tubes, or the surrounding tissues inside the lower belly area (pelvis). HOME CARE   If given, take your antibiotic medicine as told. Finish them even if you start to feel better.  Only take medicine as told by your doctor.  Do not have sex (intercourse) until treatment is done or as told by your doctor.  Tell your sex partner if you have PID. Your partner may need to be treated.  Keep all doctor visits. GET HELP RIGHT AWAY IF:   You have a fever.  You have more belly (abdominal) or lower belly pain.  You have chills.  You have pain when you pee (urinate).  You are not better after 72 hours.  You have more fluid (discharge) coming from your vagina or fluid that is not  normal.  You need pain medicine from your doctor.  You throw up (vomit).  You cannot take your medicines.  Your partner has a sexually transmitted disease (STD). MAKE SURE YOU:   Understand these instructions.  Will watch your condition.  Will get help right away if you are not doing well or get worse. Document Released: 03/21/2008 Document Revised: 04/19/2012 Document Reviewed: 12/19/2010 [facility] Patient Information 2014 [place], [place]. Pyelonephritis, Adult Pyelonephritis is a kidney infection. A kidney infection can happen quickly, or it can last for a long time. HOME CARE   Take your medicine (antibiotics) as told. Finish it even if you start to feel better.  Keep all doctor visits as told.  Drink enough fluids to keep your pee (urine) clear or pale yellow.  Only take medicine as told by your doctor. GET HELP RIGHT AWAY IF:   You have a fever or lasting symptoms for more than 2-3 days.  You have a fever and your symptoms suddenly get worse.  You cannot take your medicine or drink fluids as told.  You have chills and shaking.  You feel very weak or pass out (faint).  You do not feel better after 2 days. MAKE SURE YOU:  Understand these instructions.  Will watch your condition.  Will get help right away if you are not doing well or get worse. Document Released: 01/31/2004 Document Revised: 06/24/2011 Document Reviewed: 06/12/2010 [facility] Patient Information 2014 [place], [place].   Emergency Department Resource Guide 1) Find a Doctor and Pay Out of Pocket  Although you won't have to find out who is covered by your insurance plan, it is a good idea to ask around and get recommendations. You will then need to call the office and see if the doctor you have chosen will accept you as a new patient and what types of options they offer for patients who are self-pay. Some doctors offer discounts or will set up payment plans for their patients who do not have  insurance, but you will need to ask so you aren't surprised when you get to your appointment.  2) Contact Your Local Health Department Not all health departments have doctors that can see patients for sick visits, but many do, so it is worth a call to see if yours does. If you don't know where your local health department is, you can check in your phone book. The CDC also has a tool to help you locate your state's health department, and many state websites also have listings of all of their local health departments.  3) Find a [facility] If your illness is not likely to be very severe or complicated, you may want to try a walk in clinic. These are popping up all over the country in pharmacies, drugstores, and shopping centers. They're usually staffed by nurse practitioners or physician assistants that have been trained to treat common illnesses and complaints. They're usually fairly quick and inexpensive. However, if you have serious medical issues or chronic medical problems, these are probably not your best option.  No Primary Care Doctor: - Call Health Connect at  [phone] - they can help you locate a primary care doctor that  accepts your insurance, provides certain services, etc. - Physician Referral Service- [phone]  Chronic Pain Problems: Organization         Address  Phone   Notes  [facility]  [phone] Patients need to be referred by their primary care doctor.   Medication Assistance: Organization         Address  Phone   Notes  [facility] Medication [facility] [address] [place], [place] [address] [phone] --Must be a resident of [facility] -- Must have NO insurance coverage whatsoever (no Medicaid/ Medicare, etc.) -- The pt. MUST have a primary care doctor that directs their care regularly and follows them in the community   Med[facility]ist  [phone]   [employer]  [phone]    Agencies that provide  inexpensive medical care: Organization         Address  Phone   Notes  [place]  [phone]   [facility]    [phone]   [facility] [place], [place] [address] [phone]   [place] [address] [place] [phone]   Planned Parenthood    [phone]   [facility]    [phone]   [place] [address], [place] Phone:  [phone], Fax:  [phone] Hours of Operation:  9 am - 6 pm, M-F.  Also accepts Medicaid/Medicare and self-pay.  [facility] for [place], [address], [place] Phone: [phone], Fax: [phone]. Hours of Operation:  8:30 am - 5:30 pm, M-F.  Also accepts Medicaid and self-pay.  [facility] [place] [address], [employer] Phone: [phone]   [place]  [address], [place], [place] [phone], Ext. [phone] Mondays & Thursdays: 7-9 AM.  First 15 patients are seen on a first come, first serve basis.    Medicaid-accepting [facility] Providers:  Organization         Address  Phone   Notes  [facility] [address], [place] [phone]) [phone] Also accepts self-pay patients.  [facility] [address], [place]  [phone]) [phone]   [facility] [address], [place] [phone]) [phone]   [facility] [address] [place] [address], [place] [phone]) [phone]   Renaye RakersVeita Bland [address], [place]   [phone] Only accepts [place] Access [place] patients after they have their name applied to their card.   Self-Pay (no insurance) in [facility]:  Organization         Address  Phone   Notes  Sickle Cell Patients, [facility] [address] [place], [place] [phone]) [phone]   [facility] [address], [place] [phone](336) [phone]   [facility] [place]  [address], [place] [phone](336) [phone]   [facility]/Dr. Osei-Bonsu  [address] [place] [address], [place] or [phone], [place] [phone]) [phone] Phone number for both [place] and [place] locations is the same.  Urgent Medical and [facility] [address], [place] [phone]) [phone]   [facility] [place] [address] [place] [address], [place] or [address] [phone]) [phone]   [facility] [address], [place] [phone], phone; [phone](336) [phone], fax Sees patients 1st and 3rd Saturday of every month.  Must not qualify for public or private insurance (i.e. Medicaid, Medicare, [place] Health Choice, Veterans' Benefits)  Household income should be no more than 200% of the poverty level The clinic cannot treat you if you are pregnant or think you are pregnant  Sexually transmitted diseases are not treated at the clinic.    Dental Care: Organization         Address  Phone  Notes  [facility] Department of [facility] [address], [place] [phone] Accepts children up to age 27 who are enrolled in [place] or [place] Health Choice; pregnant women with a Medicaid card; and children who have applied for Medicaid or [place] Health Choice, but were declined, whose parents can pay a reduced fee at time of service.  [facility] Department of [facility] [place]  [address], [place] [phone]) [phone] Accepts children up to age 27 who are enrolled in [place] or [place] Health Choice; pregnant women with a Medicaid card; and children who have applied for Medicaid or  Health Choice, but were declined, whose parents can pay a reduced fee at time of service.  [facility]  [address], [place] [phone]) [phone] Patients are seen by appointment only. Walk-ins are  not accepted. [facility] will see patients 27 years of age and older. Monday - Tuesday (8am-5pm) Most Wednesdays (8:30-5pm) $30 per visit, cash only  [facility]  [address], [facility] [phone]) [phone] Patients are seen by appointment only. Walk-ins are not accepted. [facility] will see patients 27 years of age and older. One Wednesday Evening (Monthly: Volunteer Based).  $30 per visit, cash only  [employer] of Dentistry Clinics  [phone] for adults; Children under age 79, call Graduate Pediatric Dentistry at [phone]. Children aged 20-14, please call [phone] to request a pediatric application.  Dental services are provided in all areas of dental care including fillings, crowns and bridges, complete and partial dentures, implants, gum treatment, root canals, and extractions. Preventive care is also provided. Treatment is provided to both adults and children. Patients are selected via a lottery and there is often a waiting list.   [facility] [address], [place]  [phone] [email or web address]   [facility] [address] [place], [place] [phone], Ext. [phone] Second and Fourth Thursday of each month, opens at 6:30 AM; Clinic ends at 9 AM.  Patients are seen on a first-come first-served basis, and a limited number are seen during each clinic.   [facility]  [address] [place], [place] [phone]   Eligibility Requirements You must have lived in [place], [place], or [place] counties for at least the last three months.   You cannot be eligible for state or federal sponsored [place], including [employer], [place], or [employer].   You generally cannot be eligible for healthcare insurance through your employer.    How to apply: Eligibility screenings are held every Tuesday and Wednesday afternoon from 1:00 pm until 4:00 pm. You do not need an appointment for  the interview!  [facility] [address], [place], [place] [phone]   [facility] Health Department  [phone]   [facility]  [phone]   [facility]  [phone]    Behavioral Health Resources in the Community: Intensive Outpatient Programs Organization         Address  Phone  Notes  [facility] [address], [place], [place] [phone]   [facility] [address] [place], [place] [phone]   ADS: Alcohol & Drug Svcs [address] [place], [place]  [phone]   [facility] [address]  [place], [place] [phone] or [phone]   Substance Abuse Resources Organization         Address  Phone  Notes  [facility] Drug Services  [phone]   [facility]  [phone]   [facility] [place]  [phone]   Floydene Flock  [phone]   Residential & Outpatient Substance Abuse Program  [phone]   Psychological Services Organization         Address  Phone  Notes  [facility]  [phone]   [facility]  [phone]   [facility] [address] [place] [phone] or [phone]    Mobile Crisis Teams Organization         Address  Phone  Notes  [facility], Mobile Crisis Care Unit  [phone]   [facility]  [address] [place], [place] [phone]   Doristine Locks [address] [place] [phone]    Self-Help/Support Groups Organization         Address  Phone             Notes  [facility] [place] - variety of support groups  336- I7437963 Call for more information  Narcotics Anonymous (NA), [facility] [address], [employer] [place]  2 meetings at this location   Statistician  Address  Phone  Notes  [facility]  [address],    [place]  [phone]   [facility]  [address], [place] [address]45, [place], [place]   [place], [place] [phone] Admissions: 8am-3pm M-F  Incentives Substance [place] [address]    [place], [place] [phone]   [facility] [address] [place], [place], [place]   [facility] [address]  [place], [place]   [facility] Programs - Intensive Outpatient [address], [place], [place]   [facility] ([address] [place].,  [place], [place] [phone] or [phone]   [facility] Services (RTS) [address] [place], [place] Accepts Medicaid  [facility] [address],  [place] [phone] Substance Abuse/Addiction Treatment   [facility] Organization         Address  Phone  Notes  [facility]  [phone]   Domenic Schwab, PhD [address] [place]lis [place], [place]   [phone] or [phone]   [place], [place] [phone]   [facility] [address] [place], [place] [phone] Insurance/Medicaid/sponsorship through [facility] and Families [address], [address] [place]                                    [place], [place] [phone] [place] [address] [place], [place] [phone]    Dr. Adele Schilder  [phone]   [facility] Dept. 1) [address] [place] [address] [place] 2) [place] 3)  [address], [place] [phone]  [phone]   [place] [phone] or [phone] (After Hours)

## 2013-05-29 NOTE — ED Notes (Signed)
Pt has urine cup at bedside but states she is unable to provide a urine sample at this time

## 2013-05-29 NOTE — ED Notes (Signed)
Pt given rx x 3 for doxycycline, naproxyen and cipro

## 2013-05-30 LAB — GC/CHLAMYDIA PROBE AMP
CT Probe RNA: NEGATIVE
GC Probe RNA: NEGATIVE

## 2013-05-30 NOTE — ED Provider Notes (Signed)
Medical screening examination/treatment/procedure(s) were performed by non-physician practitioner and as supervising physician I was immediately available for consultation/collaboration.   Willow Shidler E Peggi Yono, MD 05/30/13 1202 

## 2013-05-31 LAB — URINE CULTURE: Colony Count: >=100000

## 2013-06-01 ENCOUNTER — Telehealth (HOSPITAL_BASED_OUTPATIENT_CLINIC_OR_DEPARTMENT_OTHER): Payer: Self-pay | Admitting: Emergency Medicine

## 2013-06-01 NOTE — Telephone Encounter (Signed)
Post ED Visit - Positive Culture Follow-up  Culture report reviewed by antimicrobial stewardship pharmacist: []  Wes Aldora, Pharm.D., BCPS []  Heide Guile, Pharm.D., BCPS []  Alycia Rossetti, Pharm.D., BCPS []  [place], Pharm.D., BCPS, AAHIVP []  Legrand Como, Pharm.D., BCPS, AAHIVP []  Juliene Pina, Pharm.D. [x]  Assunta Curtis, Pharm.D.  Positive urine culture Treated with Cipro, organism sensitive to the same and no further patient follow-up is required at this time.  Rajesh Wyss 06/01/2013, 1:55 PM

## 2013-07-30 ENCOUNTER — Encounter (HOSPITAL_COMMUNITY): Payer: Self-pay | Admitting: *Deleted

## 2013-07-30 ENCOUNTER — Inpatient Hospital Stay (HOSPITAL_COMMUNITY): Payer: Medicaid Other

## 2013-07-30 ENCOUNTER — Inpatient Hospital Stay (HOSPITAL_COMMUNITY)
Admission: AD | Admit: 2013-07-30 | Discharge: 2013-07-30 | Disposition: A | Discharge status: Home / Self Care | Payer: Medicaid Other | Source: Ambulatory Visit | Attending: Obstetrics | Admitting: Obstetrics

## 2013-07-30 DIAGNOSIS — F17200 Nicotine dependence, unspecified, uncomplicated: Secondary | ICD-10-CM | POA: Diagnosis not present

## 2013-07-30 DIAGNOSIS — R109 Unspecified abdominal pain: Secondary | ICD-10-CM | POA: Diagnosis present

## 2013-07-30 DIAGNOSIS — N938 Other specified abnormal uterine and vaginal bleeding: Secondary | ICD-10-CM | POA: Diagnosis not present

## 2013-07-30 DIAGNOSIS — N949 Unspecified condition associated with female genital organs and menstrual cycle: Secondary | ICD-10-CM | POA: Insufficient documentation

## 2013-07-30 DIAGNOSIS — D649 Anemia, unspecified: Secondary | ICD-10-CM | POA: Insufficient documentation

## 2013-07-30 DIAGNOSIS — N925 Other specified irregular menstruation: Secondary | ICD-10-CM | POA: Insufficient documentation

## 2013-07-30 DIAGNOSIS — I10 Essential (primary) hypertension: Secondary | ICD-10-CM | POA: Diagnosis not present

## 2013-07-30 LAB — WET PREP, GENITAL
Clue Cells Wet Prep HPF POC: NONE SEEN
Trich, Wet Prep: NONE SEEN
Yeast Wet Prep HPF POC: NONE SEEN

## 2013-07-30 LAB — CBC WITH DIFFERENTIAL/PLATELET
Basophils Absolute: 0.1 10*3/uL (ref 0.0–0.1)
Basophils Relative: 1 % (ref 0–1)
Eosinophils Absolute: 0.1 10*3/uL (ref 0.0–0.7)
Eosinophils Relative: 2 % (ref 0–5)
HCT: 27.5 % — ABNORMAL LOW (ref 36.0–46.0)
Hemoglobin: 8.4 g/dL — ABNORMAL LOW (ref 12.0–15.0)
Lymphocytes Relative: 46 % (ref 12–46)
Lymphs Abs: 2.2 10*3/uL (ref 0.7–4.0)
MCH: 23.2 pg — ABNORMAL LOW (ref 26.0–34.0)
MCHC: 30.5 g/dL (ref 30.0–36.0)
MCV: 76 fL — ABNORMAL LOW (ref 78.0–100.0)
Monocytes Absolute: 0.4 10*3/uL (ref 0.1–1.0)
Monocytes Relative: 9 % (ref 3–12)
Neutro Abs: 2 10*3/uL (ref 1.7–7.7)
Neutrophils Relative %: 42 % — ABNORMAL LOW (ref 43–77)
Platelets: 301 10*3/uL (ref 150–400)
RBC: 3.62 MIL/uL — ABNORMAL LOW (ref 3.87–5.11)
RDW: 16.4 % — ABNORMAL HIGH (ref 11.5–15.5)
WBC: 4.8 10*3/uL (ref 4.0–10.5)

## 2013-07-30 LAB — URINALYSIS, ROUTINE W REFLEX MICROSCOPIC
Bilirubin Urine: NEGATIVE
Glucose, UA: NEGATIVE mg/dL
Ketones, ur: NEGATIVE mg/dL
Leukocytes, UA: NEGATIVE
Nitrite: NEGATIVE
Protein, ur: NEGATIVE mg/dL
Specific Gravity, Urine: 1.01 (ref 1.005–1.030)
Urobilinogen, UA: 0.2 mg/dL (ref 0.0–1.0)
pH: 6 (ref 5.0–8.0)

## 2013-07-30 LAB — URINE MICROSCOPIC-ADD ON

## 2013-07-30 LAB — POCT PREGNANCY, URINE: Preg Test, Ur: NEGATIVE

## 2013-07-30 MED ORDER — MEDROXYPROGESTERONE ACETATE 10 MG PO TABS: 10.0000 mg | ORAL_TABLET | Freq: Every day | ORAL | Status: DC

## 2013-07-30 NOTE — MAU Provider Note (Signed)
CSN: [id number]     Arrival date & time 07/30/13  1140 History   None    Chief Complaint  Patient presents with  . Abdominal Pain     (Consider location/radiation/quality/duration/timing/severity/associated sxs/prior Treatment) Patient is a 28 y.o. female presenting with vaginal bleeding. The history is provided by the patient.  Vaginal Bleeding This is a new problem. The current episode started today. The problem occurs constantly. The problem has been unchanged. Associated symptoms include abdominal pain. Pertinent negatives include no chest pain, chills, coughing, fever, headaches, nausea, rash or vomiting. Nothing aggravates the symptoms. She has tried nothing for the symptoms.   Shirley [name] is a 28 y.o. [id number] who presents to the MAU with vaginal bleeding that started this morning. She reports having a normal period 07/09/13. She reports that prior to bleeding this morning she had lower abdominal pain. She has always had regular period in the past. She describes the bleeding as bright red and equal to a period. She has used 3 pads in the past 2 hours. Current sex partner x 2 years. Last pap smear less than one year ago and was normal. Hx of GC and Chlamydia about a year ago. No birth control.   Past Medical History  Diagnosis Date  . Eczema   . Hypertension    Past Surgical History  Procedure Laterality Date  . Cesarean section    . Wisdom tooth extraction    . Induced abortion     Family History  Problem Relation Age of Onset  . Other Neg Hx   . Hearing loss Neg Hx   . Cancer Maternal Grandmother     breast   History  Substance Use Topics  . Smoking status: Current Every Day Smoker -- 0.50 packs/day for 2 years    Types: Cigarettes  . Smokeless tobacco: Never Used  . Alcohol Use: No   OB History   Grav Para Term Preterm Abortions TAB SAB Ect Mult Living   6 3 1 2 3 2 1  1 4      Review of Systems  Constitutional: Negative for fever and chills.  HENT: Negative.    Eyes: Negative for visual disturbance.  Respiratory: Negative for cough and shortness of breath.   Cardiovascular: Negative for chest pain.  Gastrointestinal: Positive for abdominal pain. Negative for nausea and vomiting. Diarrhea: loose stools.  Genitourinary: Positive for vaginal bleeding and pelvic pain. Negative for dysuria, urgency and frequency.  Skin: Negative for rash.  Neurological: Negative for seizures, syncope, light-headedness and headaches.  Psychiatric/Behavioral: Negative for confusion. The patient is not nervous/anxious.       Allergies  Review of patient's allergies indicates no known allergies.  Home Medications   Prior to Admission medications   Medication Sig Start Date End Date Taking? Authorizing Provider  medroxyPROGESTERone (PROVERA) 10 MG tablet Take 1 tablet (10 mg total) by mouth daily. 07/30/13   Rosezella Rumpf, CNM   BP 130/64  Pulse 59  Temp(Src) 98.6 F (37 C) (Oral)  Resp 16  Ht 5\' 2"  (1.575 m)  Wt 145 lb 4 oz (65.885 kg)  BMI 26.56 kg/m2  LMP 07/09/2013 Physical Exam  Nursing note and vitals reviewed. Constitutional: She is oriented to person, place, and time. She appears well-developed and well-nourished. No distress.  HENT:  Head: Normocephalic.  Eyes: EOM are normal.  Neck: Neck supple.  Cardiovascular: Normal rate.   Pulmonary/Chest: Effort normal.  Abdominal: Soft. There is no tenderness.  Genitourinary:  External genitalia  without lesions, moderate blood vaginal vault. No CMT, no adnexal tenderness, uterus without palpable enlargement.   Musculoskeletal: Normal range of motion.  Neurological: She is alert and oriented to person, place, and time. No cranial nerve deficit.  Skin: Skin is warm and dry.  Psychiatric: She has a normal mood and affect. Her behavior is normal.    ED Course  Procedures  Results for orders placed during the hospital encounter of 07/30/13 (from the past 24 hour(s))  URINALYSIS, ROUTINE W REFLEX  MICROSCOPIC     Status: Abnormal   Collection Time    07/30/13 12:30 PM      Result Value Ref Range   Color, Urine YELLOW  YELLOW   APPearance CLEAR  CLEAR   Specific Gravity, Urine 1.010  1.005 - 1.030   pH 6.0  5.0 - 8.0   Glucose, UA NEGATIVE  NEGATIVE mg/dL   Hgb urine dipstick MODERATE (*) NEGATIVE   Bilirubin Urine NEGATIVE  NEGATIVE   Ketones, ur NEGATIVE  NEGATIVE mg/dL   Protein, ur NEGATIVE  NEGATIVE mg/dL   Urobilinogen, UA 0.2  0.0 - 1.0 mg/dL   Nitrite NEGATIVE  NEGATIVE   Leukocytes, UA NEGATIVE  NEGATIVE  URINE MICROSCOPIC-ADD ON     Status: None   Collection Time    07/30/13 12:30 PM      Result Value Ref Range   Squamous Epithelial / LPF RARE  RARE   WBC, UA 0-2  <3 WBC/hpf   RBC / HPF 3-6  <3 RBC/hpf  POCT PREGNANCY, URINE     Status: None   Collection Time    07/30/13 12:42 PM      Result Value Ref Range   Preg Test, Ur NEGATIVE  NEGATIVE  WET PREP, GENITAL     Status: Abnormal   Collection Time    07/30/13 12:51 PM      Result Value Ref Range   Yeast Wet Prep HPF POC NONE SEEN  NONE SEEN   Trich, Wet Prep NONE SEEN  NONE SEEN   Clue Cells Wet Prep HPF POC NONE SEEN  NONE SEEN   WBC, Wet Prep HPF POC FEW (*) NONE SEEN  CBC WITH DIFFERENTIAL     Status: Abnormal   Collection Time    07/30/13  1:15 PM      Result Value Ref Range   WBC 4.8  4.0 - 10.5 K/uL   RBC 3.62 (*) 3.87 - 5.11 MIL/uL   Hemoglobin 8.4 (*) 12.0 - 15.0 g/dL   HCT 27.5 (*) 36.0 - 46.0 %   MCV 76.0 (*) 78.0 - 100.0 fL   MCH 23.2 (*) 26.0 - 34.0 pg   MCHC 30.5  30.0 - 36.0 g/dL   RDW 16.4 (*) 11.5 - 15.5 %   Platelets 301  150 - 400 K/uL   Neutrophils Relative % 42 (*) 43 - 77 %   Neutro Abs 2.0  1.7 - 7.7 K/uL   Lymphocytes Relative 46  12 - 46 %   Lymphs Abs 2.2  0.7 - 4.0 K/uL   Monocytes Relative 9  3 - 12 %   Monocytes Absolute 0.4  0.1 - 1.0 K/uL   Eosinophils Relative 2  0 - 5 %   Eosinophils Absolute 0.1  0.0 - 0.7 K/uL   Basophils Relative 1  0 - 1 %   Basophils  Absolute 0.1  0.0 - 0.1 K/uL   Imaging Review US Transvaginal Non-ob  07/30/2013   CLINICAL DATA:  28 year old female with left pelvic pain and abnormal vaginal bleeding. Negative pregnancy test.  EXAM: TRANSABDOMINAL AND TRANSVAGINAL ULTRASOUND OF PELVIS  TECHNIQUE: Both transabdominal and transvaginal ultrasound examinations of the pelvis were performed. Transabdominal technique was performed for global imaging of the pelvis including uterus, ovaries, adnexal regions, and pelvic cul-de-sac. It was necessary to proceed with endovaginal exam following the transabdominal exam to visualize the ovaries and endometrium.  COMPARISON:  None  FINDINGS: Uterus  Measurements: Anteverted measuring 10.1 x 4.8 x 5.6 cm. No fibroids or other mass visualized. C-section scar identified.  Endometrium  Thickness: 8.1 mm.  No focal abnormality visualized.  Right ovary  Measurements: 4.2 x 2.1 x 2.5 cm. Normal appearance/no adnexal mass.  Left ovary  Measurements: 3.5 x 2 x 2.2 cm. Normal appearance/no adnexal mass.  Other findings  A trace amount of free pelvic fluid may be physiologic.  IMPRESSION: Trace amount of free pelvic fluid which may be physiologic.  No other significant abnormalities identified. Normal appearing ovaries and endometrium.   Electronically Signed   By: Hassan Rowan M.D.   On: 07/30/2013 15:41   US Pelvis Complete  07/30/2013   CLINICAL DATA:  28 year old female with left pelvic pain and abnormal vaginal bleeding. Negative pregnancy test.  EXAM: TRANSABDOMINAL AND TRANSVAGINAL ULTRASOUND OF PELVIS  TECHNIQUE: Both transabdominal and transvaginal ultrasound examinations of the pelvis were performed. Transabdominal technique was performed for global imaging of the pelvis including uterus, ovaries, adnexal regions, and pelvic cul-de-sac. It was necessary to proceed with endovaginal exam following the transabdominal exam to visualize the ovaries and endometrium.  COMPARISON:  None  FINDINGS: Uterus  Measurements:  Anteverted measuring 10.1 x 4.8 x 5.6 cm. No fibroids or other mass visualized. C-section scar identified.  Endometrium  Thickness: 8.1 mm.  No focal abnormality visualized.  Right ovary  Measurements: 4.2 x 2.1 x 2.5 cm. Normal appearance/no adnexal mass.  Left ovary  Measurements: 3.5 x 2 x 2.2 cm. Normal appearance/no adnexal mass.  Other findings  A trace amount of free pelvic fluid may be physiologic.  IMPRESSION: Trace amount of free pelvic fluid which may be physiologic.  No other significant abnormalities identified. Normal appearing ovaries and endometrium.   Electronically Signed   By: Hassan Rowan M.D.   On: 07/30/2013 15:41     MDM  28 y.o. female with vaginal bleeding and lower abdominal pain. Patient awaiting ultrasound and care turned over to Susa Simmonds, CNM.   Pt returned from u/s, bleeding has slowed. Pt appears stable, u/s WNL. Upon review of records, pt's hgb was 8.4 at last check on 12/22/12, same as today. Pt states she is prescribed iron for anemia, but has not been taking it.   A/P: 1. DUB (dysfunctional uterine bleeding)   Provera to stop bleeding Resume iron supplement (pt has iron at home for ongoing anemia) F/U w/ Dr. Ruthann Cancer or return to MAU w/ excessive bleeding    Medication List         medroxyPROGESTERone 10 MG tablet  Commonly known as:  PROVERA  Take 1 tablet (10 mg total) by mouth daily.            Follow-up Information   Schedule an appointment as soon as possible for a visit with Frederico Hamman, MD.   Specialty:  Obstetrics and Gynecology   Contact information:   [address] [place] [address] [phone]

## 2013-07-30 NOTE — MAU Note (Signed)
Patient presents with complaints of her menstrual cycle starting 10 days early and abdominal pain X 11/2 weeks.

## 2013-07-30 NOTE — Discharge Instructions (Signed)

## 2013-08-01 LAB — GC/CHLAMYDIA PROBE AMP
CT Probe RNA: NEGATIVE
GC Probe RNA: NEGATIVE

## 2013-09-30 ENCOUNTER — Encounter (HOSPITAL_BASED_OUTPATIENT_CLINIC_OR_DEPARTMENT_OTHER): Payer: Self-pay | Admitting: Emergency Medicine

## 2013-09-30 ENCOUNTER — Emergency Department (HOSPITAL_BASED_OUTPATIENT_CLINIC_OR_DEPARTMENT_OTHER): Payer: Medicaid Other

## 2013-09-30 ENCOUNTER — Emergency Department (HOSPITAL_BASED_OUTPATIENT_CLINIC_OR_DEPARTMENT_OTHER)
Admission: EM | Admit: 2013-09-30 | Discharge: 2013-09-30 | Disposition: A | Discharge status: Home / Self Care | Payer: Medicaid Other | Attending: Emergency Medicine | Admitting: Emergency Medicine

## 2013-09-30 DIAGNOSIS — Z79899 Other long term (current) drug therapy: Secondary | ICD-10-CM | POA: Diagnosis not present

## 2013-09-30 DIAGNOSIS — O99330 Smoking (tobacco) complicating pregnancy, unspecified trimester: Secondary | ICD-10-CM | POA: Diagnosis not present

## 2013-09-30 DIAGNOSIS — O209 Hemorrhage in early pregnancy, unspecified: Secondary | ICD-10-CM | POA: Insufficient documentation

## 2013-09-30 DIAGNOSIS — O200 Threatened abortion: Secondary | ICD-10-CM | POA: Diagnosis not present

## 2013-09-30 DIAGNOSIS — Z872 Personal history of diseases of the skin and subcutaneous tissue: Secondary | ICD-10-CM | POA: Insufficient documentation

## 2013-09-30 DIAGNOSIS — O99019 Anemia complicating pregnancy, unspecified trimester: Secondary | ICD-10-CM | POA: Insufficient documentation

## 2013-09-30 DIAGNOSIS — O169 Unspecified maternal hypertension, unspecified trimester: Secondary | ICD-10-CM | POA: Diagnosis not present

## 2013-09-30 HISTORY — DX: Iron deficiency: E61.1

## 2013-09-30 LAB — URINALYSIS, ROUTINE W REFLEX MICROSCOPIC
Bilirubin Urine: NEGATIVE
Glucose, UA: NEGATIVE mg/dL
Ketones, ur: NEGATIVE mg/dL
Leukocytes, UA: NEGATIVE
Nitrite: NEGATIVE
Protein, ur: NEGATIVE mg/dL
Specific Gravity, Urine: 1.02 (ref 1.005–1.030)
Urobilinogen, UA: 0.2 mg/dL (ref 0.0–1.0)
pH: 7 (ref 5.0–8.0)

## 2013-09-30 LAB — WET PREP, GENITAL
Trich, Wet Prep: NONE SEEN
Yeast Wet Prep HPF POC: NONE SEEN

## 2013-09-30 LAB — URINE MICROSCOPIC-ADD ON

## 2013-09-30 LAB — HCG, QUANTITATIVE, PREGNANCY: hCG, Beta Chain, Quant, S: 9654 m[IU]/mL — ABNORMAL HIGH (ref <5)

## 2013-09-30 LAB — PREGNANCY, URINE: Preg Test, Ur: POSITIVE — AB

## 2013-09-30 MED ORDER — ACETAMINOPHEN 325 MG PO TABS
650.0000 mg | ORAL_TABLET | Freq: Once | ORAL | Status: AC
Start: 2013-09-30 — End: 2013-09-30
  Administered 2013-09-30: 650 mg via ORAL
  Filled 2013-09-30: qty 2

## 2013-09-30 NOTE — ED Provider Notes (Signed)
CSN: [id number]     Arrival date & time 09/30/13  6237 History   First MD Initiated Contact with Patient 09/30/13 1010     Chief Complaint  Patient presents with  . Abdominal Pain  . Vaginal Bleeding     (Consider location/radiation/quality/duration/timing/severity/associated sxs/prior Treatment) Patient is a 28 y.o. female presenting with abdominal pain and vaginal bleeding.  Abdominal Pain Pain location:  Suprapubic Pain quality: cramping and sharp   Pain radiates to:  Does not radiate Pain severity:  Moderate Onset quality:  Gradual Duration:  1 week Timing:  Intermittent Progression:  Worsening Chronicity:  New Context comment:  Early pregnancy Relieved by: ibuprofen. Associated symptoms: vaginal bleeding   Associated symptoms: no diarrhea, no dysuria, no fever, no nausea and no vomiting   Vaginal bleeding:    Quality:  Dark red and spotting   Severity:  Mild   Onset quality:  Gradual   Duration:  6 days   Progression:  Worsening   Chronicity:  New Vaginal Bleeding Associated symptoms: abdominal pain   Associated symptoms: no dysuria, no fever and no nausea     Past Medical History  Diagnosis Date  . Eczema   . Hypertension   . Low serum iron    Past Surgical History  Procedure Laterality Date  . Cesarean section    . Wisdom tooth extraction    . Induced abortion     Family History  Problem Relation Age of Onset  . Other Neg Hx   . Hearing loss Neg Hx   . Cancer Maternal Grandmother     breast   History  Substance Use Topics  . Smoking status: Current Every Day Smoker -- 0.50 packs/day for 2 years    Types: Cigarettes  . Smokeless tobacco: Never Used  . Alcohol Use: No   OB History   Grav Para Term Preterm Abortions TAB SAB Ect Mult Living   7 3 1 2 3 2 1  1 4      Review of Systems  Constitutional: Negative for fever.  Gastrointestinal: Positive for abdominal pain. Negative for nausea, vomiting and diarrhea.  Genitourinary: Positive for  vaginal bleeding. Negative for dysuria.  All other systems reviewed and are negative.     Allergies  Review of patient's allergies indicates no known allergies.  Home Medications   Prior to Admission medications   Medication Sig Start Date End Date Taking? Authorizing Provider  ferrous fumarate (HEMOCYTE - 106 MG FE) 325 (106 FE) MG TABS tablet Take 1 tablet by mouth.   Yes Historical Provider, MD   BP 148/86  Pulse 80  Temp(Src) 99 F (37.2 C) (Oral)  Resp 18  Ht 5' (1.524 m)  Wt 145 lb (65.772 kg)  BMI 28.32 kg/m2  SpO2 100%  LMP 07/30/2013 Physical Exam  Nursing note and vitals reviewed. Constitutional: She is oriented to person, place, and time. She appears well-developed and well-nourished. No distress.  HENT:  Head: Normocephalic and atraumatic.  Eyes: Conjunctivae are normal. No scleral icterus.  Neck: Neck supple.  Cardiovascular: Normal rate and intact distal pulses.   Pulmonary/Chest: Effort normal. No stridor. No respiratory distress.  Abdominal: Normal appearance. She exhibits no distension. There is tenderness in the suprapubic area. There is no rigidity, no rebound and no guarding.  Genitourinary: Uterus is not enlarged and not tender. Cervix exhibits discharge (white, scant). Cervix exhibits no motion tenderness and no friability. Right adnexum displays no tenderness and no fullness. Left adnexum displays tenderness. Left adnexum displays  no fullness.  Neurological: She is alert and oriented to person, place, and time.  Skin: Skin is warm and dry. No rash noted.  Psychiatric: She has a normal mood and affect. Her behavior is normal.    ED Course  Procedures (including critical care time) Labs Review Labs Reviewed  WET PREP, GENITAL - Abnormal; Notable for the following:    Clue Cells Wet Prep HPF POC FEW (*)    WBC, Wet Prep HPF POC MODERATE (*)    All other components within normal limits  URINALYSIS, ROUTINE W REFLEX MICROSCOPIC - Abnormal; Notable  for the following:    Hgb urine dipstick TRACE (*)    All other components within normal limits  PREGNANCY, URINE - Abnormal; Notable for the following:    Preg Test, Ur POSITIVE (*)    All other components within normal limits  HCG, QUANTITATIVE, PREGNANCY - Abnormal; Notable for the following:    hCG, Beta Chain, Quant, S 9654 (*)    All other components within normal limits  GC/CHLAMYDIA PROBE AMP  URINE MICROSCOPIC-ADD ON    Imaging Review US Ob Comp Less 14 Wks  09/30/2013   CLINICAL DATA:  Pelvic cramping for 1 month with history of vaginal bleeding last week  EXAM: OBSTETRIC <14 WK [place] AND TRANSVAGINAL OB US  TECHNIQUE: Both transabdominal and transvaginal ultrasound examinations were performed for complete evaluation of the gestation as well as the maternal uterus, adnexal regions, and pelvic cul-de-sac. Transvaginal technique was performed to assess early pregnancy.  COMPARISON:  Pelvic ultrasound of July 30, 2013.  FINDINGS: Intrauterine gestational sac: Single, normal in shape.  Yolk sac:  Present  Embryo:  Present  Cardiac Activity:  None visible  CRL:   139  mm   7 w 5 d                  [place] EDC: May 14, 2014  Maternal uterus/adnexae: There is no evidence of subchorionic hemorrhage. The maternal right ovary was normal. The maternal left ovary contains a hypoechoic focus measuring 1.8 x 1.3 x 1.4 cm which may reflect an involuting corpus luteum cyst. There is no free pelvic fluid.  IMPRESSION: 1. There is an IUP without demonstrable cardiac activity which may reflect fetal demise. The estimated gestational age is 7 weeks 5 days. No subchorionic hemorrhage is demonstrated. 2. A normal right ovary. A probable involuting corpus luteum cyst on the left is noted. 3. Serial follow-up beta HCG determinations will be needed. Follow-up ultrasound examinations are available upon request. 4. These results were called by telephone at the time of interpretation on 09/30/2013 at 11:29 am to Dr. Serita Grit  , who verbally acknowledged these results.   Electronically Signed   By: David  Martinique   On: 09/30/2013 11:29   US Ob Transvaginal  09/30/2013   CLINICAL DATA:  Pelvic cramping for 1 month with history of vaginal bleeding last week  EXAM: OBSTETRIC <14 WK [place] AND TRANSVAGINAL OB US  TECHNIQUE: Both transabdominal and transvaginal ultrasound examinations were performed for complete evaluation of the gestation as well as the maternal uterus, adnexal regions, and pelvic cul-de-sac. Transvaginal technique was performed to assess early pregnancy.  COMPARISON:  Pelvic ultrasound of July 30, 2013.  FINDINGS: Intrauterine gestational sac: Single, normal in shape.  Yolk sac:  Present  Embryo:  Present  Cardiac Activity:  None visible  CRL:   139  mm   7 w 5 d  [place] EDC: May 14, 2014  Maternal uterus/adnexae: There is no evidence of subchorionic hemorrhage. The maternal right ovary was normal. The maternal left ovary contains a hypoechoic focus measuring 1.8 x 1.3 x 1.4 cm which may reflect an involuting corpus luteum cyst. There is no free pelvic fluid.  IMPRESSION: 1. There is an IUP without demonstrable cardiac activity which may reflect fetal demise. The estimated gestational age is 7 weeks 5 days. No subchorionic hemorrhage is demonstrated. 2. A normal right ovary. A probable involuting corpus luteum cyst on the left is noted. 3. Serial follow-up beta HCG determinations will be needed. Follow-up ultrasound examinations are available upon request. 4. These results were called by telephone at the time of interpretation on 09/30/2013 at 11:29 am to Dr. Serita Grit , who verbally acknowledged these results.   Electronically Signed   By: David  Martinique   On: 09/30/2013 11:29     EKG Interpretation None      MDM   Final diagnoses:  Threatened abortion in early pregnancy    28 yo [id number] presenting with lower abdominal cramping, pain, and scant vaginal bleeding.  [place] is concerning for possible fetal  demise.  She follows with Dr. Ruthann Cancer and will call Monday morning for a follow up appointment.  She is confident she that her blood type is O+.  She was given return precautions.      Houston Siren III, MD 09/30/13 [phone]

## 2013-09-30 NOTE — Discharge Instructions (Signed)
Your bHCG serum quantitative level on 09/30/2013 was 9654 mIU/mL   Threatened Miscarriage A threatened miscarriage occurs when you have vaginal bleeding during your first 20 weeks of pregnancy but the pregnancy has not ended. If you have vaginal bleeding during this time, your health care provider will do tests to make sure you are still pregnant. If the tests show you are still pregnant and the developing baby (fetus) inside your womb (uterus) is still growing, your condition is considered a threatened miscarriage. A threatened miscarriage does not mean your pregnancy will end, but it does increase the risk of losing your pregnancy (complete miscarriage). CAUSES  The cause of a threatened miscarriage is usually not known. If you go on to have a complete miscarriage, the most common cause is an abnormal number of chromosomes in the developing baby. Chromosomes are the structures inside cells that hold all your genetic material. Some causes of vaginal bleeding that do not result in miscarriage include:  Having sex.  Having an infection.  Normal hormone changes of pregnancy.  Bleeding that occurs when an egg implants in your uterus. RISK FACTORS Risk factors for bleeding in early pregnancy include:  Obesity.  Smoking.  Drinking excessive amounts of alcohol or caffeine.  Recreational drug use. SIGNS AND SYMPTOMS  Light vaginal bleeding.  Mild abdominal pain or cramps. DIAGNOSIS  If you have bleeding with or without abdominal pain before 20 weeks of pregnancy, your health care provider will do tests to check whether you are still pregnant. One important test involves using sound waves and a computer (ultrasound) to create images of the inside of your uterus. Other tests include an internal exam of your vagina and uterus (pelvic exam) and measurement of your baby's heart rate.  You may be diagnosed with a threatened miscarriage if:  Ultrasound testing shows you are still  pregnant.  Your baby's heart rate is strong.  A pelvic exam shows that the opening between your uterus and your vagina (cervix) is closed.  Your heart rate and blood pressure are stable.  Blood tests confirm you are still pregnant. TREATMENT  No treatments have been shown to prevent a threatened miscarriage from going on to a complete miscarriage. However, the right home care is important.  HOME CARE INSTRUCTIONS   Make sure you keep all your appointments for prenatal care. This is very important.  Get plenty of rest.  Do not have sex or use tampons if you have vaginal bleeding.  Do not douche.  Do not smoke or use recreational drugs.  Do not drink alcohol.  Avoid caffeine. SEEK MEDICAL CARE IF:  You have light vaginal bleeding or spotting while pregnant.  You have abdominal pain or cramping.  You have a fever. SEEK IMMEDIATE MEDICAL CARE IF:  You have heavy vaginal bleeding.  You have blood clots coming from your vagina.  You have severe low back pain or abdominal cramps.  You have fever, chills, and severe abdominal pain. MAKE SURE YOU:  Understand these instructions.  Will watch your condition.  Will get help right away if you are not doing well or get worse. Document Released: 12/23/2004 Document Revised: 12/28/2012 Document Reviewed: 10/19/2012 [facility] Patient Information 2015 [place], [place]. This information is not intended to replace advice given to you by your health care provider. Make sure you discuss any questions you have with your health care provider.

## 2013-09-30 NOTE — ED Notes (Signed)
MD at bedside. 

## 2013-09-30 NOTE — ED Notes (Signed)
Pt is about [redacted] weeks pregnant.  Has been having lower abdominal cramping off and on for one month.  Vaginal bleeding this week.  Also having headache.

## 2013-10-01 LAB — GC/CHLAMYDIA PROBE AMP
CT Probe RNA: NEGATIVE
GC Probe RNA: NEGATIVE

## 2013-11-07 ENCOUNTER — Encounter (HOSPITAL_BASED_OUTPATIENT_CLINIC_OR_DEPARTMENT_OTHER): Payer: Self-pay | Admitting: Emergency Medicine

## 2014-02-24 ENCOUNTER — Inpatient Hospital Stay (HOSPITAL_COMMUNITY): Payer: Medicaid Other

## 2014-02-24 ENCOUNTER — Inpatient Hospital Stay (HOSPITAL_COMMUNITY)
Admission: AD | Admit: 2014-02-24 | Discharge: 2014-02-24 | Disposition: A | Discharge status: Home / Self Care | Payer: Medicaid Other | Source: Ambulatory Visit | Attending: Obstetrics | Admitting: Obstetrics

## 2014-02-24 ENCOUNTER — Encounter (HOSPITAL_COMMUNITY): Payer: Self-pay | Admitting: *Deleted

## 2014-02-24 DIAGNOSIS — Z3A01 Less than 8 weeks gestation of pregnancy: Secondary | ICD-10-CM

## 2014-02-24 DIAGNOSIS — F17210 Nicotine dependence, cigarettes, uncomplicated: Secondary | ICD-10-CM | POA: Insufficient documentation

## 2014-02-24 DIAGNOSIS — R109 Unspecified abdominal pain: Secondary | ICD-10-CM | POA: Insufficient documentation

## 2014-02-24 DIAGNOSIS — O26899 Other specified pregnancy related conditions, unspecified trimester: Secondary | ICD-10-CM

## 2014-02-24 DIAGNOSIS — O9989 Other specified diseases and conditions complicating pregnancy, childbirth and the puerperium: Secondary | ICD-10-CM | POA: Diagnosis not present

## 2014-02-24 DIAGNOSIS — O99331 Smoking (tobacco) complicating pregnancy, first trimester: Secondary | ICD-10-CM | POA: Insufficient documentation

## 2014-02-24 LAB — CBC
HCT: 34.4 % — ABNORMAL LOW (ref 36.0–46.0)
Hemoglobin: 11.3 g/dL — ABNORMAL LOW (ref 12.0–15.0)
MCH: 28.7 pg (ref 26.0–34.0)
MCHC: 32.8 g/dL (ref 30.0–36.0)
MCV: 87.3 fL (ref 78.0–100.0)
Platelets: 299 10*3/uL (ref 150–400)
RBC: 3.94 MIL/uL (ref 3.87–5.11)
RDW: 16.4 % — ABNORMAL HIGH (ref 11.5–15.5)
WBC: 5.4 10*3/uL (ref 4.0–10.5)

## 2014-02-24 LAB — POCT PREGNANCY, URINE: Preg Test, Ur: POSITIVE — AB

## 2014-02-24 LAB — URINALYSIS, ROUTINE W REFLEX MICROSCOPIC
Bilirubin Urine: NEGATIVE
Glucose, UA: NEGATIVE mg/dL
Hgb urine dipstick: NEGATIVE
Ketones, ur: NEGATIVE mg/dL
Leukocytes, UA: NEGATIVE
Nitrite: NEGATIVE
Protein, ur: NEGATIVE mg/dL
Specific Gravity, Urine: 1.025 (ref 1.005–1.030)
Urobilinogen, UA: 0.2 mg/dL (ref 0.0–1.0)
pH: 7 (ref 5.0–8.0)

## 2014-02-24 LAB — WET PREP, GENITAL
Clue Cells Wet Prep HPF POC: NONE SEEN
Trich, Wet Prep: NONE SEEN
Yeast Wet Prep HPF POC: NONE SEEN

## 2014-02-24 LAB — HCG, QUANTITATIVE, PREGNANCY: hCG, Beta Chain, Quant, S: 4707 m[IU]/mL — ABNORMAL HIGH (ref <5)

## 2014-02-24 MED ORDER — ACETAMINOPHEN 325 MG PO TABS
650.0000 mg | ORAL_TABLET | ORAL | Status: AC
  Administered 2014-02-24: 650 mg via ORAL
  Filled 2014-02-24: qty 2

## 2014-02-24 NOTE — Discharge Instructions (Signed)

## 2014-02-24 NOTE — MAU Note (Signed)
Pain started about a wk ago, cramping in lower abd. No bleeding. Hx of  SAB last Sept- states started about the same.

## 2014-02-24 NOTE — MAU Provider Note (Signed)
History     CSN: [id number]  Arrival date and time: 02/24/14 7782   First Provider Initiated Contact with Patient 02/24/14 [id number]      Chief Complaint  Patient presents with  . Abdominal Pain  . Possible Pregnancy   HPI Shirley [name] 29 y.o. [id number] @[redacted]w[redacted]d  presents to MAU complaining of abdominal pain that started a week ago and has been 8/10 for 2 days.  It feels like a cramping.  She had this same feeling with prior miscarriages.  She had a positive PRT on 2/16.  She denies vaginal bleeding, vomiting, dysuria, weakness, fever.  She admits to nausea.  OB History    Gravida Para Term Preterm AB TAB SAB Ectopic Multiple Living   8 3 1 2 3 2 1  1 4       Past Medical History  Diagnosis Date  . Eczema   . Hypertension   . Low serum iron     Past Surgical History  Procedure Laterality Date  . Cesarean section    . Wisdom tooth extraction    . Induced abortion      Family History  Problem Relation Age of Onset  . Other Neg Hx   . Hearing loss Neg Hx   . Cancer Maternal Grandmother     breast    History  Substance Use Topics  . Smoking status: Current Every Day Smoker -- 0.50 packs/day for 2 years    Types: Cigarettes  . Smokeless tobacco: Never Used  . Alcohol Use: No    Allergies: No Known Allergies  Prescriptions prior to admission  Medication Sig Dispense Refill Last Dose  . ferrous fumarate (HEMOCYTE - 106 MG FE) 325 (106 FE) MG TABS tablet Take 1 tablet by mouth daily.    Past Week at Unknown time    ROS Pertinent ROS in HPI  Physical Exam   Blood pressure 140/85, pulse 86, temperature 98.3 F (36.8 C), temperature source Oral, resp. rate 18, height 5' 1.5" (1.562 m), weight 149 lb (67.586 kg), last menstrual period 01/23/2014, unknown if currently breastfeeding.  Physical Exam  Constitutional: She is oriented to person, place, and time. She appears well-developed and well-nourished. No distress.  HENT:  Head: Normocephalic and atraumatic.  Eyes:  EOM are normal.  Neck: Normal range of motion.  Cardiovascular: Normal rate, regular rhythm and normal heart sounds.   Respiratory: Breath sounds normal. No respiratory distress.  GI: Soft. Bowel sounds are normal. She exhibits no distension. There is tenderness. There is no rebound and no guarding.  Mild tenderness in bilat lower abdomen  Genitourinary:  Normal white vag discharge and pink vag mucosa No CMT No adnexal mass or tenderness  Musculoskeletal: Normal range of motion.  Neurological: She is alert and oriented to person, place, and time.  Skin: Skin is warm and dry.  Psychiatric: She has a normal mood and affect.   Results for orders placed or performed during the hospital encounter of 02/24/14 (from the past 24 hour(s))  Urinalysis, Routine w reflex microscopic     Status: None   Collection Time: 02/24/14  9:20 AM  Result Value Ref Range   Color, Urine YELLOW YELLOW   APPearance CLEAR CLEAR   Specific Gravity, Urine 1.025 1.005 - 1.030   pH 7.0 5.0 - 8.0   Glucose, UA NEGATIVE NEGATIVE mg/dL   Hgb urine dipstick NEGATIVE NEGATIVE   Bilirubin Urine NEGATIVE NEGATIVE   Ketones, ur NEGATIVE NEGATIVE mg/dL   Protein, ur NEGATIVE NEGATIVE  mg/dL   Urobilinogen, UA 0.2 0.0 - 1.0 mg/dL   Nitrite NEGATIVE NEGATIVE   Leukocytes, UA NEGATIVE NEGATIVE  Pregnancy, urine POC     Status: Abnormal   Collection Time: 02/24/14  9:30 AM  Result Value Ref Range   Preg Test, Ur POSITIVE (A) NEGATIVE  Wet prep, genital     Status: Abnormal   Collection Time: 02/24/14 10:02 AM  Result Value Ref Range   Yeast Wet Prep HPF POC NONE SEEN NONE SEEN   Trich, Wet Prep NONE SEEN NONE SEEN   Clue Cells Wet Prep HPF POC NONE SEEN NONE SEEN   WBC, Wet Prep HPF POC FEW (A) NONE SEEN  CBC     Status: Abnormal   Collection Time: 02/24/14 10:20 AM  Result Value Ref Range   WBC 5.4 4.0 - 10.5 K/uL   RBC 3.94 3.87 - 5.11 MIL/uL   Hemoglobin 11.3 (L) 12.0 - 15.0 g/dL   HCT 34.4 (L) 36.0 - 46.0 %    MCV 87.3 78.0 - 100.0 fL   MCH 28.7 26.0 - 34.0 pg   MCHC 32.8 30.0 - 36.0 g/dL   RDW 16.4 (H) 11.5 - 15.5 %   Platelets 299 150 - 400 K/uL  hCG, quantitative, pregnancy     Status: Abnormal   Collection Time: 02/24/14 10:20 AM  Result Value Ref Range   hCG, Beta Chain, Quant, S 4707 (H) <5 mIU/mL   US Ob Comp Less 14 Wks  02/24/2014   CLINICAL DATA:  Patient with cramping for 1 week.  EXAM: OBSTETRIC <14 WK [place] AND TRANSVAGINAL OB US  TECHNIQUE: Both transabdominal and transvaginal ultrasound examinations were performed for complete evaluation of the gestation as well as the maternal uterus, adnexal regions, and pelvic cul-de-sac. Transvaginal technique was performed to assess early pregnancy.  COMPARISON:  None.  FINDINGS: Intrauterine gestational sac: Visualized/normal in shape.  Yolk sac:  Present  Embryo:  Not present  Cardiac Activity: Not present  MSD: 5  mm   5 w   0  d  Maternal uterus/adnexae: Normal right and left ovaries. Small subchorionic hemorrhage. No free fluid in the pelvis.  IMPRESSION: Probable early intrauterine gestational sac and yolk sac but no fetal pole or cardiac activity yet visualized. Recommend follow-up quantitative B-HCG levels and follow-up US in 14 days to confirm and assess viability. This recommendation follows SRU consensus guidelines: Diagnostic Criteria for Nonviable Pregnancy Early in the First Trimester. [place] Med 2013; 379:0240-97.   Electronically Signed   By: Lovey Newcomer M.D.   On: 02/24/2014 11:54   US Ob Transvaginal  02/24/2014   CLINICAL DATA:  Patient with cramping for 1 week.  EXAM: OBSTETRIC <14 WK [place] AND TRANSVAGINAL OB US  TECHNIQUE: Both transabdominal and transvaginal ultrasound examinations were performed for complete evaluation of the gestation as well as the maternal uterus, adnexal regions, and pelvic cul-de-sac. Transvaginal technique was performed to assess early pregnancy.  COMPARISON:  None.  FINDINGS: Intrauterine gestational sac:  Visualized/normal in shape.  Yolk sac:  Present  Embryo:  Not present  Cardiac Activity: Not present  MSD: 5  mm   5 w   0  d  Maternal uterus/adnexae: Normal right and left ovaries. Small subchorionic hemorrhage. No free fluid in the pelvis.  IMPRESSION: Probable early intrauterine gestational sac and yolk sac but no fetal pole or cardiac activity yet visualized. Recommend follow-up quantitative B-HCG levels and follow-up US in 14 days to confirm and assess viability.  This recommendation follows SRU consensus guidelines: Diagnostic Criteria for Nonviable Pregnancy Early in the First Trimester. [employer] Med 2013; 622:6333-54.   Electronically Signed   By: Lovey Newcomer M.D.   On: 02/24/2014 11:54    MAU Course  Procedures  MDM Pt indicates pain relief with Tylenol.   No evidence for ectopic on u/s as yolk sac is present.  No embryo present today.  Will require further HCG follow up to eval for possible failed pregnancy.    Assessment and Plan  A:  1. Abdominal pain in pregnancy    P: Discharge to home  Return to MAU in 48 hours for follow up Quant HCG Precautions for bleeding/increased pain - return to mau Begin PNV Okay to use OTC Tylenol PRN pain/discomfort Patient may return to MAU as needed or if her condition were to change or worsen   Paticia Stack 02/24/2014, 9:50 AM

## 2014-02-25 LAB — RPR: RPR Ser Ql: NONREACTIVE

## 2014-02-27 LAB — HIV ANTIBODY (ROUTINE TESTING W REFLEX): HIV Screen 4th Generation wRfx: NONREACTIVE

## 2014-02-27 LAB — GC/CHLAMYDIA PROBE AMP (~~LOC~~) NOT AT ARMC
Chlamydia: NEGATIVE
Neisseria Gonorrhea: NEGATIVE

## 2014-03-20 ENCOUNTER — Encounter (HOSPITAL_BASED_OUTPATIENT_CLINIC_OR_DEPARTMENT_OTHER): Payer: Self-pay | Admitting: Emergency Medicine

## 2014-03-20 ENCOUNTER — Emergency Department (HOSPITAL_BASED_OUTPATIENT_CLINIC_OR_DEPARTMENT_OTHER)
Admission: EM | Admit: 2014-03-20 | Discharge: 2014-03-21 | Disposition: A | Discharge status: Home / Self Care | Payer: Medicaid Other | Attending: Emergency Medicine | Admitting: Emergency Medicine

## 2014-03-20 DIAGNOSIS — D509 Iron deficiency anemia, unspecified: Secondary | ICD-10-CM | POA: Diagnosis not present

## 2014-03-20 DIAGNOSIS — I10 Essential (primary) hypertension: Secondary | ICD-10-CM | POA: Diagnosis not present

## 2014-03-20 DIAGNOSIS — Z79899 Other long term (current) drug therapy: Secondary | ICD-10-CM | POA: Insufficient documentation

## 2014-03-20 DIAGNOSIS — R2243 Localized swelling, mass and lump, lower limb, bilateral: Secondary | ICD-10-CM | POA: Diagnosis not present

## 2014-03-20 DIAGNOSIS — Z872 Personal history of diseases of the skin and subcutaneous tissue: Secondary | ICD-10-CM | POA: Insufficient documentation

## 2014-03-20 DIAGNOSIS — Z720 Tobacco use: Secondary | ICD-10-CM | POA: Diagnosis not present

## 2014-03-20 DIAGNOSIS — R609 Edema, unspecified: Secondary | ICD-10-CM

## 2014-03-20 NOTE — ED Notes (Signed)
Pt states she has been having leg pain all day today.

## 2014-03-21 ENCOUNTER — Encounter (HOSPITAL_BASED_OUTPATIENT_CLINIC_OR_DEPARTMENT_OTHER): Payer: Self-pay | Admitting: Emergency Medicine

## 2014-03-21 LAB — PREGNANCY, URINE: Preg Test, Ur: POSITIVE — AB

## 2014-03-21 NOTE — Discharge Instructions (Signed)
Peripheral Edema °You have swelling in your legs (peripheral edema). This swelling is due to excess accumulation of salt and water in your body. Edema may be a sign of heart, kidney or liver disease, or a side effect of a medication. It may also be due to problems in the leg veins. Elevating your legs and using special support stockings may be very helpful, if the cause of the swelling is due to poor venous circulation. Avoid long periods of standing, whatever the cause. °Treatment of edema depends on identifying the cause. Chips, pretzels, pickles and other salty foods should be avoided. Restricting salt in your diet is almost always needed. Water pills (diuretics) are often used to remove the excess salt and water from your body via urine. These medicines prevent the kidney from reabsorbing sodium. This increases urine flow. °Diuretic treatment may also result in lowering of potassium levels in your body. Potassium supplements may be needed if you have to use diuretics daily. Daily weights can help you keep track of your progress in clearing your edema. You should call your caregiver for follow up care as recommended. °SEEK IMMEDIATE MEDICAL CARE IF:  °· You have increased swelling, pain, redness, or heat in your legs. °· You develop shortness of breath, especially when lying down. °· You develop chest or abdominal pain, weakness, or fainting. °· You have a fever. °Document Released: 01/31/2004 Document Revised: 03/17/2011 Document Reviewed: 01/10/2009 °[employer]® Patient Information ©2015 [employer]. This information is not intended to replace advice given to you by your health care provider. Make sure you discuss any questions you have with your health care provider. ° °

## 2014-03-21 NOTE — ED Provider Notes (Signed)
CSN: [id number]     Arrival date & time 03/20/14  2247 History   This chart was scribed for Shirley Ranney, MD by Shirley [name], ED Scribe. This patient was seen in room MHFT1/MHFT1 and the patient's care was started 12:08 AM.    Chief Complaint  Patient presents with  . Leg Swelling     Patient is a 29 y.o. female presenting with lower extremity pain. The history is provided by the patient. No language interpreter was used.  Foot Pain This is a new problem. The current episode started 6 to 12 hours ago. The problem occurs constantly. The problem has been rapidly improving. Pertinent negatives include no chest pain, no abdominal pain and no shortness of breath. Nothing aggravates the symptoms. Nothing relieves the symptoms. She has tried rest for the symptoms. The treatment provided significant relief.     HPI Comments:  Shirley [name] is a 29 y.o. female who presents to the Emergency Department complaining of mild-moderate bilateral  Foot swelling for one day. She describes her pain as a tightness. States her feet and ankles were swollen but are now markedly improved since coming to the ED.   No alleviating factors or associated symptoms noted.   Past Medical History  Diagnosis Date  . Eczema   . Hypertension   . Low serum iron    Past Surgical History  Procedure Laterality Date  . Cesarean section    . Wisdom tooth extraction    . Induced abortion     Family History  Problem Relation Age of Onset  . Other Neg Hx   . Hearing loss Neg Hx   . Cancer Maternal Grandmother     breast   History  Substance Use Topics  . Smoking status: Current Every Day Smoker -- 0.50 packs/day for 2 years    Types: Cigarettes  . Smokeless tobacco: Never Used  . Alcohol Use: No   OB History    Gravida Para Term Preterm AB TAB SAB Ectopic Multiple Living   8 3 1 2 3 2 1  1 4      Review of Systems  Constitutional: Negative for fever and chills.  Respiratory: Negative for cough and shortness  of breath.   Cardiovascular: Negative for chest pain and palpitations.  Gastrointestinal: Negative for nausea, vomiting and abdominal pain.  Musculoskeletal: Positive for myalgias.  All other systems reviewed and are negative.     Allergies  Review of patient's allergies indicates no known allergies.  Home Medications   Prior to Admission medications   Medication Sig Start Date End Date Taking? Authorizing Provider  ferrous fumarate (HEMOCYTE - 106 MG FE) 325 (106 FE) MG TABS tablet Take 1 tablet by mouth daily.     Historical Provider, MD   BP 155/88 mmHg  Pulse 89  Temp(Src) 98.5 F (36.9 C) (Oral)  Resp 18  Ht 5' (1.524 m)  Wt 151 lb (68.493 kg)  BMI 29.49 kg/m2  SpO2 100%  LMP 01/23/2014 (Exact Date)  Breastfeeding? Unknown Physical Exam  Constitutional: She is oriented to person, place, and time. She appears well-developed and well-nourished. No distress.  HENT:  Head: Normocephalic and atraumatic.  Mouth/Throat: Oropharynx is clear and moist.  Eyes: Conjunctivae are normal. Pupils are equal, round, and reactive to light.  Neck: Normal range of motion. Neck supple.  Cardiovascular: Normal rate, regular rhythm and normal heart sounds.   Pulmonary/Chest: Effort normal and breath sounds normal. No respiratory distress. She has no wheezes. She has  no rales.  Abdominal: Soft. Bowel sounds are normal. She exhibits no distension. There is no tenderness.  Musculoskeletal: Normal range of motion. She exhibits no tenderness.  Trace pedal edema  Neurological: She is alert and oriented to person, place, and time.  Skin: Skin is warm and dry. No bruising, no ecchymosis, no petechiae, no purpura and no rash noted. Rash is not nodular and not urticarial. No cyanosis or erythema. No pallor.  Psychiatric: She has a normal mood and affect. Her behavior is normal.  Nursing note and vitals reviewed.   ED Course  Procedures   DIAGNOSTIC STUDIES:  Oxygen Saturation is 100% on RA,  normal by my interpretation.    COORDINATION OF CARE:  12:11 AM Discussed treatment plan with pt at bedside and pt agreed to plan.  Labs Review Labs Reviewed  PREGNANCY, URINE    Imaging Review No results found.   EKG Interpretation None      MDM   Final diagnoses:  None    Symptoms are consistent with dependent edema and have resolved in the ED>  Follow up with your PMD for ongoing care.  Ice and elevation and compression stockings  I personally performed the services described in this documentation, which was scribed in my presence. The recorded information has been reviewed and is accurate.     Veatrice Kells, MD 03/21/14 [phone]

## 2014-03-21 NOTE — ED Notes (Signed)
Went in to give patient discharge instructions and patient had eloped. EDP had given instructions but patient didn't stay to sign out.

## 2014-10-23 LAB — PROCEDURE REPORT - SCANNED: Pap: NEGATIVE

## 2015-03-24 ENCOUNTER — Encounter (HOSPITAL_BASED_OUTPATIENT_CLINIC_OR_DEPARTMENT_OTHER): Payer: Self-pay

## 2015-03-24 ENCOUNTER — Emergency Department (HOSPITAL_BASED_OUTPATIENT_CLINIC_OR_DEPARTMENT_OTHER)
Admission: EM | Admit: 2015-03-24 | Discharge: 2015-03-24 | Disposition: A | Discharge status: Home / Self Care | Payer: Medicaid Other | Attending: Emergency Medicine | Admitting: Emergency Medicine

## 2015-03-24 DIAGNOSIS — F17210 Nicotine dependence, cigarettes, uncomplicated: Secondary | ICD-10-CM | POA: Insufficient documentation

## 2015-03-24 DIAGNOSIS — M79662 Pain in left lower leg: Secondary | ICD-10-CM | POA: Diagnosis present

## 2015-03-24 DIAGNOSIS — I10 Essential (primary) hypertension: Secondary | ICD-10-CM | POA: Insufficient documentation

## 2015-03-24 NOTE — ED Notes (Addendum)
Pt reports left lower leg pain, localized to front of leg and lateral aspect. States it feels like "hot spots" Pt does smoke. Denies known injury.

## 2015-03-24 NOTE — ED Notes (Signed)
Called for patient in waiting room x2, no answer, pt not present in waiting room. Assumed left after triage without being seen by ED provider.

## 2015-07-31 ENCOUNTER — Emergency Department (HOSPITAL_BASED_OUTPATIENT_CLINIC_OR_DEPARTMENT_OTHER): Payer: Medicaid Other

## 2015-07-31 ENCOUNTER — Encounter: Payer: Self-pay | Admitting: Emergency Medicine

## 2015-07-31 ENCOUNTER — Emergency Department (HOSPITAL_BASED_OUTPATIENT_CLINIC_OR_DEPARTMENT_OTHER)
Admission: EM | Admit: 2015-07-31 | Discharge: 2015-07-31 | Disposition: A | Discharge status: Home / Self Care | Payer: Medicaid Other | Attending: Emergency Medicine | Admitting: Emergency Medicine

## 2015-07-31 DIAGNOSIS — Z79899 Other long term (current) drug therapy: Secondary | ICD-10-CM | POA: Diagnosis not present

## 2015-07-31 DIAGNOSIS — R102 Pelvic and perineal pain: Secondary | ICD-10-CM

## 2015-07-31 DIAGNOSIS — N8500 Endometrial hyperplasia, unspecified: Secondary | ICD-10-CM | POA: Diagnosis not present

## 2015-07-31 DIAGNOSIS — F17210 Nicotine dependence, cigarettes, uncomplicated: Secondary | ICD-10-CM | POA: Diagnosis not present

## 2015-07-31 DIAGNOSIS — I10 Essential (primary) hypertension: Secondary | ICD-10-CM | POA: Diagnosis not present

## 2015-07-31 DIAGNOSIS — R9389 Abnormal findings on diagnostic imaging of other specified body structures: Secondary | ICD-10-CM

## 2015-07-31 DIAGNOSIS — R1031 Right lower quadrant pain: Secondary | ICD-10-CM | POA: Diagnosis present

## 2015-07-31 DIAGNOSIS — R1030 Lower abdominal pain, unspecified: Secondary | ICD-10-CM

## 2015-07-31 LAB — CBC WITH DIFFERENTIAL/PLATELET
Basophils Absolute: 0 10*3/uL (ref 0.0–0.1)
Basophils Relative: 1 %
Eosinophils Absolute: 0.1 10*3/uL (ref 0.0–0.7)
Eosinophils Relative: 3 %
HCT: 27.5 % — ABNORMAL LOW (ref 36.0–46.0)
Hemoglobin: 8.7 g/dL — ABNORMAL LOW (ref 12.0–15.0)
Lymphocytes Relative: 34 %
Lymphs Abs: 1.7 10*3/uL (ref 0.7–4.0)
MCH: 25.8 pg — ABNORMAL LOW (ref 26.0–34.0)
MCHC: 31.6 g/dL (ref 30.0–36.0)
MCV: 81.6 fL (ref 78.0–100.0)
Monocytes Absolute: 0.4 10*3/uL (ref 0.1–1.0)
Monocytes Relative: 8 %
Neutro Abs: 2.7 10*3/uL (ref 1.7–7.7)
Neutrophils Relative %: 54 %
Platelets: 343 10*3/uL (ref 150–400)
RBC: 3.37 MIL/uL — ABNORMAL LOW (ref 3.87–5.11)
RDW: 15.7 % — ABNORMAL HIGH (ref 11.5–15.5)
WBC: 4.9 10*3/uL (ref 4.0–10.5)

## 2015-07-31 LAB — URINALYSIS, ROUTINE W REFLEX MICROSCOPIC
Bilirubin Urine: NEGATIVE
Glucose, UA: NEGATIVE mg/dL
Hgb urine dipstick: NEGATIVE
Ketones, ur: NEGATIVE mg/dL
Leukocytes, UA: NEGATIVE
Nitrite: NEGATIVE
Protein, ur: NEGATIVE mg/dL
Specific Gravity, Urine: 1.007 (ref 1.005–1.030)
pH: 7 (ref 5.0–8.0)

## 2015-07-31 LAB — COMPREHENSIVE METABOLIC PANEL
ALT: 11 U/L — ABNORMAL LOW (ref 14–54)
AST: 18 U/L (ref 15–41)
Albumin: 4 g/dL (ref 3.5–5.0)
Alkaline Phosphatase: 68 U/L (ref 38–126)
Anion gap: 6 (ref 5–15)
BUN: 8 mg/dL (ref 6–20)
CO2: 26 mmol/L (ref 22–32)
Calcium: 8.7 mg/dL — ABNORMAL LOW (ref 8.9–10.3)
Chloride: 103 mmol/L (ref 101–111)
Creatinine, Ser: 0.66 mg/dL (ref 0.44–1.00)
GFR calc Af Amer: >60 mL/min (ref >60)
GFR calc non Af Amer: >60 mL/min (ref >60)
Glucose, Bld: 90 mg/dL (ref 65–99)
Potassium: 4 mmol/L (ref 3.5–5.1)
Sodium: 135 mmol/L (ref 135–145)
Total Bilirubin: 0.5 mg/dL (ref 0.3–1.2)
Total Protein: 7.3 g/dL (ref 6.5–8.1)

## 2015-07-31 LAB — WET PREP, GENITAL
Sperm: NONE SEEN
Trich, Wet Prep: NONE SEEN
Yeast Wet Prep HPF POC: NONE SEEN

## 2015-07-31 LAB — PREGNANCY, URINE: Preg Test, Ur: NEGATIVE

## 2015-07-31 MED ORDER — ONDANSETRON HCL 4 MG/2ML IJ SOLN
4.0000 mg | Freq: Once | INTRAMUSCULAR | Status: AC
  Administered 2015-07-31: 4 mg via INTRAVENOUS
  Filled 2015-07-31: qty 2

## 2015-07-31 MED ORDER — SODIUM CHLORIDE 0.9 % IV BOLUS (SEPSIS)
1000.0000 mL | Freq: Once | INTRAVENOUS | Status: AC
  Administered 2015-07-31: 1000 mL via INTRAVENOUS

## 2015-07-31 MED ORDER — HYDROCODONE-ACETAMINOPHEN 5-325 MG PO TABS: 1.0000 | ORAL_TABLET | Freq: Four times a day (QID) | ORAL | 0 refills | Status: DC | PRN

## 2015-07-31 MED ORDER — KETOROLAC TROMETHAMINE 30 MG/ML IJ SOLN
30.0000 mg | Freq: Once | INTRAMUSCULAR | Status: AC
  Administered 2015-07-31: 30 mg via INTRAVENOUS
  Filled 2015-07-31: qty 1

## 2015-07-31 MED FILL — HYDROCODON-APAP 5-325: 5-325 | 3 days supply | Qty: 15 | Fill #0

## 2015-07-31 NOTE — ED Triage Notes (Addendum)
C/o pressure in low abd then is generalized x 1 week. Nausea, no v/d. Urgency with urination no burning. No vaginal discharge. Pt states she had an abortion 2 months ago. She was [redacted] weeks pregnant at time.

## 2015-07-31 NOTE — ED Provider Notes (Addendum)
[facility] Provider Note   CSN: [id number] Arrival date & time: 07/31/15  1022  First Provider Contact:  First MD Initiated Contact with Patient 07/31/15 1107        History   Chief Complaint Chief Complaint  Patient presents with  . Abdominal Pain    HPI ZERUIAH [name] is a 30 y.o. female.  The history is provided by the patient.  Abdominal Pain   This is a new problem. Episode onset: 1.5 weeks. The problem occurs constantly. The problem has been gradually worsening. Associated with: Patient states the pain started spontaneously but is worse with sitting, movement and occasionally with food. The pain is located in the RLQ and suprapubic region. The pain is at a severity of 9/10. The pain is severe. Associated symptoms include anorexia and nausea. Pertinent negatives include fever, diarrhea, vomiting, constipation, dysuria and frequency. Associated symptoms comments: No vaginal bleeding, discharge. Patient did have an elective abortion 2 months ago at [redacted] weeks gestation but there were no complications from that. She is currently sexually active with one partner and is not use protection.. The symptoms are aggravated by certain positions. Nothing relieves the symptoms. Past workup does not include CT scan or ultrasound.    Past Medical History:  Diagnosis Date  . Eczema   . Hypertension   . Low serum iron     There are no active problems to display for this patient.   Past Surgical History:  Procedure Laterality Date  . CESAREAN SECTION    . INDUCED ABORTION    . INDUCED ABORTION    . WISDOM TOOTH EXTRACTION      OB History    Gravida Para Term Preterm AB Living   9 3 1 2 3 4    SAB TAB Ectopic Multiple Live Births   1 2   1          Home Medications    Prior to Admission medications   Medication Sig Start Date End Date Taking? Authorizing Provider  ferrous fumarate (HEMOCYTE - 106 MG FE) 325 (106 FE) MG TABS tablet Take 1 tablet by mouth daily.     Yes Historical Provider, MD    Family History Family History  Problem Relation Age of Onset  . Cancer Maternal Grandmother     breast  . Other Neg Hx   . Hearing loss Neg Hx     Social History Social History  Substance Use Topics  . Smoking status: Current Every Day Smoker    Packs/day: 0.50    Years: 2.00    Types: Cigarettes  . Smokeless tobacco: Never Used  . Alcohol use No     Allergies   Review of patient's allergies indicates no known allergies.   Review of Systems Review of Systems  Constitutional: Negative for fever.  Gastrointestinal: Positive for abdominal pain, anorexia and nausea. Negative for constipation, diarrhea and vomiting.  Genitourinary: Negative for dysuria and frequency.  All other systems reviewed and are negative.    Physical Exam Updated Vital Signs BP 165/97 (BP Location: Right Arm)   Pulse 67   Temp 97.6 F (36.4 C) (Oral)   Resp 18   Ht 5' (1.524 m)   Wt 145 lb (65.8 kg)   LMP 07/20/2015   SpO2 100%   Breastfeeding? Unknown   BMI 28.32 kg/m   Physical Exam  Constitutional: She is oriented to person, place, and time. She appears well-developed and well-nourished. No distress.  HENT:  Head: Normocephalic and  atraumatic.  Mouth/Throat: Oropharynx is clear and moist.  Eyes: Conjunctivae and EOM are normal. Pupils are equal, round, and reactive to light.  Neck: Normal range of motion. Neck supple.  Cardiovascular: Normal rate, regular rhythm and intact distal pulses.   No murmur heard. Pulmonary/Chest: Effort normal and breath sounds normal. No respiratory distress. She has no wheezes. She has no rales.  Abdominal: Soft. She exhibits no distension. There is tenderness in the right lower quadrant and suprapubic area. There is guarding. There is no rebound.  Genitourinary: Uterus normal. Cervix exhibits no motion tenderness, no discharge and no friability. Right adnexum displays tenderness. Right adnexum displays no fullness. Left  adnexum displays no tenderness. No tenderness in the vagina. No vaginal discharge found.  Musculoskeletal: Normal range of motion. She exhibits no edema or tenderness.  Neurological: She is alert and oriented to person, place, and time.  Skin: Skin is warm and dry. Capillary refill takes less than 2 seconds. No rash noted. No erythema.  Psychiatric: She has a normal mood and affect. Her behavior is normal.  Nursing note and vitals reviewed.    ED Treatments / Results  Labs (all labs ordered are listed, but only abnormal results are displayed) Labs Reviewed  WET PREP, GENITAL - Abnormal; Notable for the following:       Result Value   Clue Cells Wet Prep HPF POC PRESENT (*)    WBC, Wet Prep HPF POC MODERATE (*)    All other components within normal limits  CBC WITH DIFFERENTIAL/PLATELET - Abnormal; Notable for the following:    RBC 3.37 (*)    Hemoglobin 8.7 (*)    HCT 27.5 (*)    MCH 25.8 (*)    RDW 15.7 (*)    All other components within normal limits  COMPREHENSIVE METABOLIC PANEL - Abnormal; Notable for the following:    Calcium 8.7 (*)    ALT 11 (*)    All other components within normal limits  URINALYSIS, ROUTINE W REFLEX MICROSCOPIC (NOT AT [facility])  PREGNANCY, URINE  HIV ANTIBODY (ROUTINE TESTING)  RPR  GC/CHLAMYDIA PROBE AMP ([place]) NOT AT [facility]    EKG  EKG Interpretation None       Radiology US Transvaginal Non-ob  Result Date: 07/31/2015 CLINICAL DATA:  Bilateral pelvic pain, worse on the right for 2 weeks. Abortion in April. EXAM: TRANSABDOMINAL AND TRANSVAGINAL ULTRASOUND OF PELVIS DOPPLER ULTRASOUND OF OVARIES TECHNIQUE: Both transabdominal and transvaginal ultrasound examinations of the pelvis were performed. Transabdominal technique was performed for global imaging of the pelvis including uterus, ovaries, adnexal regions, and pelvic cul-de-sac. It was necessary to proceed with endovaginal exam following the transabdominal exam to visualize the uterus,  ovaries, and adnexa. Color and duplex Doppler ultrasound was utilized to evaluate blood flow to the ovaries. COMPARISON:  02/24/2014 obstetric ultrasound. FINDINGS: Uterus Measurements: 10.0 x 5.2 x 6.4 cm. Normal in morphology. Endometrium Thickness: Thickened, 2.0 cm.  No focal abnormality identified. Right ovary Measurements: 4.3 x 1.9 x 3.1 cm. Normal in morphology. Normal color Doppler signal. Left ovary Measurements: 3.6 x 2.2 x 2.3 cm. Normal in morphology. normal color Doppler signal. Pulsed Doppler evaluation of both ovaries demonstrates normal low-resistance arterial and venous waveforms. Other findings Trace free pelvic fluid is likely physiologic. IMPRESSION: 1. No evidence of ovarian or adnexal torsion. No explanation for pain. 2. Endometrial thickening, 2.0 cm. Endometrial thickness is considered abnormal. Consider follow-up by [place] in 6-8 weeks, during the week immediately following menses (exam timing is critical).  This recommendation follows consensus guidelines. Electronically Signed   By: Abigail Miyamoto M.D.   On: 07/31/2015 12:45  US Pelvis Complete  Result Date: 07/31/2015 CLINICAL DATA:  Bilateral pelvic pain, worse on the right for 2 weeks. Abortion in April. EXAM: TRANSABDOMINAL AND TRANSVAGINAL ULTRASOUND OF PELVIS DOPPLER ULTRASOUND OF OVARIES TECHNIQUE: Both transabdominal and transvaginal ultrasound examinations of the pelvis were performed. Transabdominal technique was performed for global imaging of the pelvis including uterus, ovaries, adnexal regions, and pelvic cul-de-sac. It was necessary to proceed with endovaginal exam following the transabdominal exam to visualize the uterus, ovaries, and adnexa. Color and duplex Doppler ultrasound was utilized to evaluate blood flow to the ovaries. COMPARISON:  02/24/2014 obstetric ultrasound. FINDINGS: Uterus Measurements: 10.0 x 5.2 x 6.4 cm. Normal in morphology. Endometrium Thickness: Thickened, 2.0 cm.  No focal abnormality identified.  Right ovary Measurements: 4.3 x 1.9 x 3.1 cm. Normal in morphology. Normal color Doppler signal. Left ovary Measurements: 3.6 x 2.2 x 2.3 cm. Normal in morphology. normal color Doppler signal. Pulsed Doppler evaluation of both ovaries demonstrates normal low-resistance arterial and venous waveforms. Other findings Trace free pelvic fluid is likely physiologic. IMPRESSION: 1. No evidence of ovarian or adnexal torsion. No explanation for pain. 2. Endometrial thickening, 2.0 cm. Endometrial thickness is considered abnormal. Consider follow-up by [place] in 6-8 weeks, during the week immediately following menses (exam timing is critical). This recommendation follows consensus guidelines. Electronically Signed   By: Abigail Miyamoto M.D.   On: 07/31/2015 12:45  [place] Art/ven Flow Abd Pelv Doppler  Result Date: 07/31/2015 CLINICAL DATA:  Bilateral pelvic pain, worse on the right for 2 weeks. Abortion in April. EXAM: TRANSABDOMINAL AND TRANSVAGINAL ULTRASOUND OF PELVIS DOPPLER ULTRASOUND OF OVARIES TECHNIQUE: Both transabdominal and transvaginal ultrasound examinations of the pelvis were performed. Transabdominal technique was performed for global imaging of the pelvis including uterus, ovaries, adnexal regions, and pelvic cul-de-sac. It was necessary to proceed with endovaginal exam following the transabdominal exam to visualize the uterus, ovaries, and adnexa. Color and duplex Doppler ultrasound was utilized to evaluate blood flow to the ovaries. COMPARISON:  02/24/2014 obstetric ultrasound. FINDINGS: Uterus Measurements: 10.0 x 5.2 x 6.4 cm. Normal in morphology. Endometrium Thickness: Thickened, 2.0 cm.  No focal abnormality identified. Right ovary Measurements: 4.3 x 1.9 x 3.1 cm. Normal in morphology. Normal color Doppler signal. Left ovary Measurements: 3.6 x 2.2 x 2.3 cm. Normal in morphology. normal color Doppler signal. Pulsed Doppler evaluation of both ovaries demonstrates normal low-resistance arterial and venous  waveforms. Other findings Trace free pelvic fluid is likely physiologic. IMPRESSION: 1. No evidence of ovarian or adnexal torsion. No explanation for pain. 2. Endometrial thickening, 2.0 cm. Endometrial thickness is considered abnormal. Consider follow-up by [place] in 6-8 weeks, during the week immediately following menses (exam timing is critical). This recommendation follows consensus guidelines. Electronically Signed   By: Abigail Miyamoto M.D.   On: 07/31/2015 12:45   Procedures Procedures (including critical care time)  Medications Ordered in ED Medications  ondansetron (ZOFRAN) injection 4 mg (not administered)  ketorolac (TORADOL) 30 MG/ML injection 30 mg (not administered)  sodium chloride 0.9 % bolus 1,000 mL (not administered)     Initial Impression / Assessment and Plan / ED Course  I have reviewed the triage vital signs and the nursing notes.  Pertinent labs & imaging results that were available during my care of the patient were reviewed by me and considered in my medical decision making (see chart for details).  Clinical Course  Patient is a healthy 30 year old female presenting today with lower abdominal pain for the last 1-1/2 weeks. Worsening the last few days. Nausea without vomiting. No dysuria, change in bowel habits. She has not had any vaginal discharge or bleeding but does have a notable history for an abortion at [redacted] weeks gestation 2 months ago. She had an irregular period 2 weeks ago and is still sexually active not using protection.  On exam patient has lower abdominal pain without rebound but more significant in the right lower quadrant. Concern for pelvic pathology such as tubo-ovarian abscess, ectopic pregnancy, ovarian torsion, ovarian cyst versus appendicitis. Pelvic exam pending. CBC, CMP, UA, UPT pending  1:28 PM Patient is pelvic exam with some right adnexal tenderness but no chandelier sign. Ultrasound without significant finding except for thickened endometrial  stripe. Patient had an unusual. A few weeks ago which was shorter and lighter than normal. She needs a repeat ultrasound in 6-8 weeks after her normal menses. At this point low suspicion for appendicitis and do not feel that patient warrants a CT scan at this time. Labs are all within normal limits. Discussed with patient follow-up with women's and she at this time defers antibiotic treatment unless GC and Chlamydia cultures come back positive.  Final Clinical Impressions(s) / ED Diagnoses   Final diagnoses:  Right adnexal tenderness  Lower abdominal pain  Thickened endometrium    New Prescriptions New Prescriptions   HYDROCODONE-ACETAMINOPHEN (NORCO/VICODIN) 5-325 MG TABLET    Take 1 tablet by mouth every 6 (six) hours as needed for severe pain.     Blanchie Dessert, MD 07/31/15 1329    Blanchie Dessert, MD 07/31/15 1332

## 2015-08-01 LAB — GC/CHLAMYDIA PROBE AMP (~~LOC~~) NOT AT ARMC
Chlamydia: NEGATIVE
Neisseria Gonorrhea: NEGATIVE

## 2015-08-01 LAB — HIV ANTIBODY (ROUTINE TESTING W REFLEX): HIV Screen 4th Generation wRfx: NONREACTIVE

## 2015-08-01 LAB — RPR: RPR Ser Ql: NONREACTIVE

## 2015-08-03 ENCOUNTER — Telehealth (HOSPITAL_BASED_OUTPATIENT_CLINIC_OR_DEPARTMENT_OTHER): Payer: Self-pay

## 2016-01-16 ENCOUNTER — Ambulatory Visit: Payer: Medicaid Other | Admitting: Obstetrics

## 2016-03-06 ENCOUNTER — Emergency Department (HOSPITAL_BASED_OUTPATIENT_CLINIC_OR_DEPARTMENT_OTHER)
Admission: EM | Admit: 2016-03-06 | Discharge: 2016-03-06 | Disposition: A | Discharge status: Home / Self Care | Payer: Medicaid Other | Attending: Emergency Medicine | Admitting: Emergency Medicine

## 2016-03-06 ENCOUNTER — Encounter (HOSPITAL_BASED_OUTPATIENT_CLINIC_OR_DEPARTMENT_OTHER): Payer: Self-pay | Admitting: Emergency Medicine

## 2016-03-06 ENCOUNTER — Emergency Department (HOSPITAL_BASED_OUTPATIENT_CLINIC_OR_DEPARTMENT_OTHER): Payer: Medicaid Other

## 2016-03-06 DIAGNOSIS — Z79899 Other long term (current) drug therapy: Secondary | ICD-10-CM | POA: Insufficient documentation

## 2016-03-06 DIAGNOSIS — I10 Essential (primary) hypertension: Secondary | ICD-10-CM | POA: Insufficient documentation

## 2016-03-06 DIAGNOSIS — O26891 Other specified pregnancy related conditions, first trimester: Secondary | ICD-10-CM | POA: Diagnosis present

## 2016-03-06 DIAGNOSIS — Z3A01 Less than 8 weeks gestation of pregnancy: Secondary | ICD-10-CM | POA: Diagnosis not present

## 2016-03-06 DIAGNOSIS — R102 Pelvic and perineal pain: Secondary | ICD-10-CM | POA: Diagnosis not present

## 2016-03-06 DIAGNOSIS — O208 Other hemorrhage in early pregnancy: Secondary | ICD-10-CM | POA: Diagnosis not present

## 2016-03-06 DIAGNOSIS — O468X1 Other antepartum hemorrhage, first trimester: Secondary | ICD-10-CM

## 2016-03-06 DIAGNOSIS — O99331 Smoking (tobacco) complicating pregnancy, first trimester: Secondary | ICD-10-CM | POA: Insufficient documentation

## 2016-03-06 DIAGNOSIS — O418X10 Other specified disorders of amniotic fluid and membranes, first trimester, not applicable or unspecified: Secondary | ICD-10-CM

## 2016-03-06 DIAGNOSIS — F17210 Nicotine dependence, cigarettes, uncomplicated: Secondary | ICD-10-CM | POA: Insufficient documentation

## 2016-03-06 LAB — PREGNANCY, URINE: Preg Test, Ur: POSITIVE — AB

## 2016-03-06 LAB — CBC WITH DIFFERENTIAL/PLATELET
Basophils Absolute: 0 10*3/uL (ref 0.0–0.1)
Basophils Relative: 1 %
Eosinophils Absolute: 0.1 10*3/uL (ref 0.0–0.7)
Eosinophils Relative: 2 %
HCT: 30.4 % — ABNORMAL LOW (ref 36.0–46.0)
Hemoglobin: 9.9 g/dL — ABNORMAL LOW (ref 12.0–15.0)
Lymphocytes Relative: 32 %
Lymphs Abs: 1.9 10*3/uL (ref 0.7–4.0)
MCH: 27 pg (ref 26.0–34.0)
MCHC: 32.6 g/dL (ref 30.0–36.0)
MCV: 82.8 fL (ref 78.0–100.0)
Monocytes Absolute: 0.4 10*3/uL (ref 0.1–1.0)
Monocytes Relative: 7 %
Neutro Abs: 3.4 10*3/uL (ref 1.7–7.7)
Neutrophils Relative %: 58 %
Platelets: 289 10*3/uL (ref 150–400)
RBC: 3.67 MIL/uL — ABNORMAL LOW (ref 3.87–5.11)
RDW: 21 % — ABNORMAL HIGH (ref 11.5–15.5)
WBC: 5.8 10*3/uL (ref 4.0–10.5)

## 2016-03-06 LAB — BASIC METABOLIC PANEL
Anion gap: 5 (ref 5–15)
BUN: 8 mg/dL (ref 6–20)
CO2: 25 mmol/L (ref 22–32)
Calcium: 8.6 mg/dL — ABNORMAL LOW (ref 8.9–10.3)
Chloride: 103 mmol/L (ref 101–111)
Creatinine, Ser: 0.6 mg/dL (ref 0.44–1.00)
GFR calc Af Amer: >60 mL/min (ref >60)
GFR calc non Af Amer: >60 mL/min (ref >60)
Glucose, Bld: 90 mg/dL (ref 65–99)
Potassium: 3.8 mmol/L (ref 3.5–5.1)
Sodium: 133 mmol/L — ABNORMAL LOW (ref 135–145)

## 2016-03-06 LAB — URINALYSIS, ROUTINE W REFLEX MICROSCOPIC
Bilirubin Urine: NEGATIVE
Glucose, UA: NEGATIVE mg/dL
Hgb urine dipstick: NEGATIVE
Ketones, ur: NEGATIVE mg/dL
Nitrite: NEGATIVE
Protein, ur: NEGATIVE mg/dL
Specific Gravity, Urine: 1.012 (ref 1.005–1.030)
pH: 7 (ref 5.0–8.0)

## 2016-03-06 LAB — URINALYSIS, MICROSCOPIC (REFLEX)

## 2016-03-06 LAB — WET PREP, GENITAL
Clue Cells Wet Prep HPF POC: NONE SEEN
Sperm: NONE SEEN
Trich, Wet Prep: NONE SEEN
Yeast Wet Prep HPF POC: NONE SEEN

## 2016-03-06 LAB — HCG, QUANTITATIVE, PREGNANCY: hCG, Beta Chain, Quant, S: 44221 m[IU]/mL — ABNORMAL HIGH (ref <5)

## 2016-03-06 NOTE — Discharge Instructions (Signed)
Prenatal vitamins. Tylenol for pain. Your ultrasound showed you are [redacted]w[redacted]d. You do have a small subchorionic hemorrhage which most of the time resolves on its own, however sometimes can lead to miscarriage.

## 2016-03-06 NOTE — ED Provider Notes (Signed)
[facility] Provider Note   CSN: [id number] Arrival date & time: 03/06/16  1036     History   Chief Complaint Chief Complaint  Patient presents with  . Abdominal Pain    HPI Shirley [name] is a 31 y.o. female.  HPI Shirley [name] is a 32 y.o. female presents to emergency department complaining of lower abdominal discomfort and fatigue. Patient states that she feels like she is pregnant. She states that she had an induced abortion with methotrexate at the beginning of January. She states she was 7 weeks at that time. She states that it went well and she went back for ultrasound a week later and had a normal ultrasound. Patient reports few weeks after she developed some nausea, weakness, and lower abdominal cramping, states feels like she could be pregnant. She is currently not on any birth control, states she is unable to tolerate due to side effects. She denies any vaginal bleeding. She has not had a period after the abortion. She denies any urinary symptoms. No fever or chills. No other complaints.  Past Medical History:  Diagnosis Date  . Eczema   . Hypertension   . Low serum iron     There are no active problems to display for this patient.   Past Surgical History:  Procedure Laterality Date  . CESAREAN SECTION    . INDUCED ABORTION    . INDUCED ABORTION    . WISDOM TOOTH EXTRACTION      OB History    Gravida Para Term Preterm AB Living   9 3 1 2 3 4    SAB TAB Ectopic Multiple Live Births   1 2   1 1        Home Medications    Prior to Admission medications   Medication Sig Start Date End Date Taking? Authorizing Provider  ferrous fumarate (HEMOCYTE - 106 MG FE) 325 (106 FE) MG TABS tablet Take 1 tablet by mouth daily.     Historical Provider, MD  HYDROcodone-acetaminophen (NORCO/VICODIN) 5-325 MG tablet Take 1 tablet by mouth every 6 (six) hours as needed for severe pain. 07/31/15   Blanchie Dessert, MD    Family History Family History  Problem  Relation Age of Onset  . Cancer Maternal Grandmother     breast  . Other Neg Hx   . Hearing loss Neg Hx     Social History Social History  Substance Use Topics  . Smoking status: Current Every Day Smoker    Packs/day: 0.50    Years: 2.00    Types: Cigarettes  . Smokeless tobacco: Never Used  . Alcohol use No     Allergies   Patient has no known allergies.   Review of Systems Review of Systems  Constitutional: Positive for fatigue. Negative for chills and fever.  Respiratory: Negative for cough, chest tightness and shortness of breath.   Cardiovascular: Negative for chest pain, palpitations and leg swelling.  Gastrointestinal: Positive for nausea. Negative for abdominal pain, diarrhea and vomiting.  Genitourinary: Positive for pelvic pain. Negative for dysuria, flank pain, vaginal bleeding, vaginal discharge and vaginal pain.  Musculoskeletal: Negative for arthralgias, myalgias, neck pain and neck stiffness.  Skin: Negative for rash.  Neurological: Positive for weakness. Negative for dizziness and headaches.  All other systems reviewed and are negative.    Physical Exam Updated Vital Signs BP 121/76 (BP Location: Right Arm)   Pulse 72   Temp 99.3 F (37.4 C) (Oral)   Resp 16  Ht 5' (1.524 m)   Wt 68.9 kg   LMP 11/19/2015   SpO2 100%   BMI 29.69 kg/m   Physical Exam  Constitutional: She appears well-developed and well-nourished. No distress.  HENT:  Head: Normocephalic.  Eyes: Conjunctivae are normal.  Neck: Neck supple.  Cardiovascular: Normal rate, regular rhythm and normal heart sounds.   Pulmonary/Chest: Effort normal and breath sounds normal. No respiratory distress. She has no wheezes. She has no rales.  Abdominal: Soft. Bowel sounds are normal. She exhibits no distension. There is no tenderness. There is no rebound.  Genitourinary:  Genitourinary Comments: Normal external genitalia. Normal vaginal canal. Small thin white discharge. Cervix is normal,  closed. No CMT. No uterine or adnexal tenderness. No masses palpated.    Musculoskeletal: She exhibits no edema.  Neurological: She is alert.  Skin: Skin is warm and dry.  Psychiatric: She has a normal mood and affect. Her behavior is normal.  Nursing note and vitals reviewed.    ED Treatments / Results  Labs (all labs ordered are listed, but only abnormal results are displayed) Labs Reviewed  WET PREP, GENITAL - Abnormal; Notable for the following:       Result Value   WBC, Wet Prep HPF POC MANY (*)    All other components within normal limits  URINALYSIS, ROUTINE W REFLEX MICROSCOPIC - Abnormal; Notable for the following:    Leukocytes, UA LARGE (*)    All other components within normal limits  PREGNANCY, URINE - Abnormal; Notable for the following:    Preg Test, Ur POSITIVE (*)    All other components within normal limits  URINALYSIS, MICROSCOPIC (REFLEX) - Abnormal; Notable for the following:    Bacteria, UA FEW (*)    Squamous Epithelial / LPF 0-5 (*)    All other components within normal limits  HCG, QUANTITATIVE, PREGNANCY - Abnormal; Notable for the following:    hCG, Beta Chain, Quant, S 44,221 (*)    All other components within normal limits  CBC WITH DIFFERENTIAL/PLATELET - Abnormal; Notable for the following:    RBC 3.67 (*)    Hemoglobin 9.9 (*)    HCT 30.4 (*)    RDW 21.0 (*)    All other components within normal limits  BASIC METABOLIC PANEL - Abnormal; Notable for the following:    Sodium 133 (*)    Calcium 8.6 (*)    All other components within normal limits  GC/CHLAMYDIA PROBE AMP ([place]) NOT AT [facility]    EKG  EKG Interpretation None       Radiology US Ob Comp Less 14 Wks  Result Date: 03/06/2016 CLINICAL DATA:  Abdominal pain with positive pregnancy test. EXAM: OBSTETRIC <14 WK [place] AND TRANSVAGINAL OB US TECHNIQUE: Both transabdominal and transvaginal ultrasound examinations were performed for complete evaluation of the gestation as well as  the maternal uterus, adnexal regions, and pelvic cul-de-sac. Transvaginal technique was performed to assess early pregnancy. COMPARISON:  None. FINDINGS: Intrauterine gestational sac: Single. Yolk sac:  Visualized. Embryo:  Visualized. Cardiac Activity: Visualized. Heart Rate: 118  bpm CRL:  8  Mm   6 w   5 d                  [place] EDC: 10/24/2016 Subchorionic hemorrhage: Moderate subphrenic hemorrhage identified adjacent to the gestational sac, but there is also evidence for blood within the endometrial canal. Maternal uterus/adnexae: Maternal ovaries unremarkable. No intraperitoneal free fluid in the cul-de-sac. IMPRESSION: 1. Single living intrauterine gestation  at estimated 6 week 5 day gestational age by crown-rump length. 2. Subchorionic hemorrhage identified with blood products visible in the endometrial canal. Electronically Signed   By: Misty Stanley M.D.   On: 03/06/2016 13:08   US Ob Transvaginal  Result Date: 03/06/2016 CLINICAL DATA:  Abdominal pain with positive pregnancy test. EXAM: OBSTETRIC <14 WK [place] AND TRANSVAGINAL OB US TECHNIQUE: Both transabdominal and transvaginal ultrasound examinations were performed for complete evaluation of the gestation as well as the maternal uterus, adnexal regions, and pelvic cul-de-sac. Transvaginal technique was performed to assess early pregnancy. COMPARISON:  None. FINDINGS: Intrauterine gestational sac: Single. Yolk sac:  Visualized. Embryo:  Visualized. Cardiac Activity: Visualized. Heart Rate: 118  bpm CRL:  8  Mm   6 w   5 d                  [place] EDC: 10/24/2016 Subchorionic hemorrhage: Moderate subphrenic hemorrhage identified adjacent to the gestational sac, but there is also evidence for blood within the endometrial canal. Maternal uterus/adnexae: Maternal ovaries unremarkable. No intraperitoneal free fluid in the cul-de-sac. IMPRESSION: 1. Single living intrauterine gestation at estimated 6 week 5 day gestational age by crown-rump length. 2. Subchorionic  hemorrhage identified with blood products visible in the endometrial canal. Electronically Signed   By: Misty Stanley M.D.   On: 03/06/2016 13:08    Procedures Procedures (including critical care time)  Medications Ordered in ED Medications - No data to display   Initial Impression / Assessment and Plan / ED Course  I have reviewed the triage vital signs and the nursing notes.  Pertinent labs & imaging results that were available during my care of the patient were reviewed by me and considered in my medical decision making (see chart for details).     [facility] emergency department with complaint of pelvic cramping and feeling like she could be pregnant. Patient was several weeks pregnant at the beginning of January, approximately 8 weeks ago when she had an induced abortion with methotrexate. Patient's pregnancy test today is positive. We will get hCG levels and ultrasound to see if this is the same pregnancy or if patient could possibly be pregnant again.  Patient is ultrasound confirms pregnancy at 6 weeks 5 days. If there is subchorionic hemorrhage present. Patient had no bleeding on pelvic exam. Discuss results. This is in fact a new pregnancy. She will follow-up with OB/GYN.  Vitals:   03/06/16 1059 03/06/16 1314  BP: (!) 145/107 121/76  Pulse: 87 72  Resp: 18 16  Temp: 99.3 F (37.4 C)   TempSrc: Oral   SpO2: 100% 100%  Weight: 68.9 kg   Height: 5' (1.524 m)     Final Clinical Impressions(s) / ED Diagnoses   Final diagnoses:  Less than [redacted] weeks gestation of pregnancy  Subchorionic hematoma in first trimester, single or unspecified fetus    New Prescriptions Discharge Medication List as of 03/06/2016  2:02 PM       Jeannett Senior, PA-C 03/06/16 [place], PA-C 03/06/16 1610    Lacretia Leigh, MD 03/07/16 [phone]

## 2016-03-06 NOTE — ED Triage Notes (Signed)
Pt reports she had an abortion by the pill on Jan 3 and has not felt right since then. Reports lower abd pain and headaches. Denies vaginal bleeding or discharge.

## 2016-03-07 LAB — GC/CHLAMYDIA PROBE AMP (~~LOC~~) NOT AT ARMC
Chlamydia: NEGATIVE
Neisseria Gonorrhea: NEGATIVE

## 2016-03-31 ENCOUNTER — Encounter (HOSPITAL_BASED_OUTPATIENT_CLINIC_OR_DEPARTMENT_OTHER): Payer: Self-pay | Admitting: Emergency Medicine

## 2016-03-31 ENCOUNTER — Emergency Department (HOSPITAL_BASED_OUTPATIENT_CLINIC_OR_DEPARTMENT_OTHER)
Admission: EM | Admit: 2016-03-31 | Discharge: 2016-03-31 | Disposition: A | Discharge status: Home / Self Care | Payer: Medicaid Other | Attending: Emergency Medicine | Admitting: Emergency Medicine

## 2016-03-31 DIAGNOSIS — F17210 Nicotine dependence, cigarettes, uncomplicated: Secondary | ICD-10-CM | POA: Insufficient documentation

## 2016-03-31 DIAGNOSIS — Z3A09 9 weeks gestation of pregnancy: Secondary | ICD-10-CM | POA: Diagnosis not present

## 2016-03-31 DIAGNOSIS — O23591 Infection of other part of genital tract in pregnancy, first trimester: Secondary | ICD-10-CM | POA: Insufficient documentation

## 2016-03-31 DIAGNOSIS — O4690 Antepartum hemorrhage, unspecified, unspecified trimester: Secondary | ICD-10-CM

## 2016-03-31 DIAGNOSIS — O99331 Smoking (tobacco) complicating pregnancy, first trimester: Secondary | ICD-10-CM | POA: Diagnosis not present

## 2016-03-31 DIAGNOSIS — N760 Acute vaginitis: Secondary | ICD-10-CM

## 2016-03-31 DIAGNOSIS — I10 Essential (primary) hypertension: Secondary | ICD-10-CM | POA: Insufficient documentation

## 2016-03-31 DIAGNOSIS — O209 Hemorrhage in early pregnancy, unspecified: Secondary | ICD-10-CM | POA: Diagnosis present

## 2016-03-31 DIAGNOSIS — B9689 Other specified bacterial agents as the cause of diseases classified elsewhere: Secondary | ICD-10-CM

## 2016-03-31 LAB — CBC WITH DIFFERENTIAL/PLATELET
Basophils Absolute: 0 10*3/uL (ref 0.0–0.1)
Basophils Relative: 0 %
Eosinophils Absolute: 0.1 10*3/uL (ref 0.0–0.7)
Eosinophils Relative: 2 %
HCT: 31.5 % — ABNORMAL LOW (ref 36.0–46.0)
Hemoglobin: 10.4 g/dL — ABNORMAL LOW (ref 12.0–15.0)
Lymphocytes Relative: 31 %
Lymphs Abs: 1.6 10*3/uL (ref 0.7–4.0)
MCH: 28.5 pg (ref 26.0–34.0)
MCHC: 33 g/dL (ref 30.0–36.0)
MCV: 86.3 fL (ref 78.0–100.0)
Monocytes Absolute: 0.4 10*3/uL (ref 0.1–1.0)
Monocytes Relative: 7 %
Neutro Abs: 3 10*3/uL (ref 1.7–7.7)
Neutrophils Relative %: 60 %
Platelets: 267 10*3/uL (ref 150–400)
RBC: 3.65 MIL/uL — ABNORMAL LOW (ref 3.87–5.11)
RDW: 19.8 % — ABNORMAL HIGH (ref 11.5–15.5)
WBC: 5.1 10*3/uL (ref 4.0–10.5)

## 2016-03-31 LAB — COMPREHENSIVE METABOLIC PANEL
ALT: 11 U/L — ABNORMAL LOW (ref 14–54)
AST: 19 U/L (ref 15–41)
Albumin: 3.5 g/dL (ref 3.5–5.0)
Alkaline Phosphatase: 63 U/L (ref 38–126)
Anion gap: 5 (ref 5–15)
BUN: 7 mg/dL (ref 6–20)
CO2: 25 mmol/L (ref 22–32)
Calcium: 8.5 mg/dL — ABNORMAL LOW (ref 8.9–10.3)
Chloride: 105 mmol/L (ref 101–111)
Creatinine, Ser: 0.54 mg/dL (ref 0.44–1.00)
GFR calc Af Amer: >60 mL/min (ref >60)
GFR calc non Af Amer: >60 mL/min (ref >60)
Glucose, Bld: 78 mg/dL (ref 65–99)
Potassium: 3.2 mmol/L — ABNORMAL LOW (ref 3.5–5.1)
Sodium: 135 mmol/L (ref 135–145)
Total Bilirubin: 0.5 mg/dL (ref 0.3–1.2)
Total Protein: 6.6 g/dL (ref 6.5–8.1)

## 2016-03-31 LAB — URINALYSIS, ROUTINE W REFLEX MICROSCOPIC
Bilirubin Urine: NEGATIVE
Glucose, UA: NEGATIVE mg/dL
Hgb urine dipstick: NEGATIVE
Ketones, ur: NEGATIVE mg/dL
Leukocytes, UA: NEGATIVE
Nitrite: NEGATIVE
Protein, ur: NEGATIVE mg/dL
Specific Gravity, Urine: 1.021 (ref 1.005–1.030)
pH: 6.5 (ref 5.0–8.0)

## 2016-03-31 LAB — ABO/RH: ABO/RH(D): O POS

## 2016-03-31 LAB — WET PREP, GENITAL
Sperm: NONE SEEN
Trich, Wet Prep: NONE SEEN
Yeast Wet Prep HPF POC: NONE SEEN

## 2016-03-31 LAB — PREGNANCY, URINE: Preg Test, Ur: POSITIVE — AB

## 2016-03-31 MED ORDER — PRENATAL COMPLETE 14-0.4 MG PO TABS: 1.0000 | ORAL_TABLET | Freq: Every day | ORAL | 1 refills | Status: AC

## 2016-03-31 NOTE — Discharge Instructions (Signed)
You were seen in the ED today with pregnancy and mild vaginal bleeding. This may be normal or a sign of a pregnancy at risk of miscarriage. At this point, there is nothing you can do to prevent a miscarriage if it is going to happen but the baby appears healthy on our exam today.   You need to call the OB/Gyn today to schedule an appointment ASAP. You can discuss your options and treatment plan in more detail.   Return to the ED with any new or worsening pain, vaginal bleeding, lightheadedness, fever, chills, or chest pain.

## 2016-03-31 NOTE — ED Notes (Addendum)
Call received from [facility] lab at 10:30 that wrong swab was sent in GC/Chlamydia sample. Discussed with Dr. Laverta Baltimore and test re-ordered from urine sample in lab

## 2016-03-31 NOTE — ED Triage Notes (Signed)
Currently [redacted] weeks pregnant, with spotting since Sat and abd cramping. Has not used a pad and has not had a OB visit. Was told was pregnant [redacted] weeks ago in ED

## 2016-03-31 NOTE — ED Provider Notes (Signed)
Emergency Department Provider Note   I have reviewed the triage vital signs and the nursing notes.   HISTORY  Chief Complaint Vaginal Bleeding   HPI Shirley [name] is a 31 y.o. female with PMH HTN and Eczema presents to the emergency pertinent for evaluation of vaginal spotting with persistent lower abdominal cramping over the past 2 weeks. Her vaginal bleeding is been over the past 2 days. Start getting worse. She notes that she has some blood on the toilet paper when she wipes. The bleeding has not been heavy enough to require using a pad. 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 She was seen in the emergency department earlier this month for lower abdominal pain and was found to be pregnant at that time. She has not seen an OB/GYN since that visit and is not taking prenatal vitamins. She reports that this is not a desired pregnancy. No radiation of her pain. No passage of clot or tissue. No chest pain or difficulty breathing. No modifying factors. Symptoms are mild to moderate.    Past Medical History:  Diagnosis Date  . Eczema   . Hypertension   . Low serum iron     There are no active problems to display for this patient.   Past Surgical History:  Procedure Laterality Date  . CESAREAN SECTION    . INDUCED ABORTION    . INDUCED ABORTION    . WISDOM TOOTH EXTRACTION      Current Outpatient Rx  . Order #: [id number] Class: Historical Med  . Order #: [id number] Class: Print  . Order #: [id number] Class: Print    Allergies Patient has no allergy information on record.  Family History  Problem Relation Age of Onset  . Cancer Maternal Grandmother     breast  . Other Neg Hx   . Hearing loss Neg Hx     Social History Social History  Substance Use Topics  . Smoking status: Current Every Day Smoker    Packs/day: 0.50    Years: 2.00    Types: Cigarettes  . Smokeless tobacco: Never Used  . Alcohol use No    Review of  Systems  Constitutional: No fever/chills Eyes: No visual changes. ENT: No sore throat. Cardiovascular: Denies chest pain. Respiratory: Denies shortness of breath. Gastrointestinal: No abdominal pain.  No nausea, no vomiting.  No diarrhea.  No constipation. Genitourinary: Negative for dysuria. Positive lower abdominal cramping and vaginal spotting.  Musculoskeletal: Negative for back pain. Skin: Negative for rash. Neurological: Negative for headaches, focal weakness or numbness.  10-point ROS otherwise negative.  ____________________________________________   PHYSICAL EXAM:  VITAL SIGNS: ED Triage Vitals  Enc Vitals Group     BP 03/31/16 0823 126/77     Pulse Rate 03/31/16 0823 76     Resp 03/31/16 0823 16     Temp 03/31/16 0823 98.3 F (36.8 C)     Temp Source 03/31/16 0823 Oral     SpO2 03/31/16 0823 100 %     Weight 03/31/16 0819 155 lb (70.3 kg)     Height 03/31/16 0819 5' (1.524 m)     Pain Score 03/31/16 0819 7   Constitutional: Alert and oriented. Well appearing and in no acute distress. Eyes: Conjunctivae are normal. Head: Atraumatic. Nose: No congestion/rhinnorhea. Mouth/Throat: Mucous membranes are moist.  Oropharynx non-erythematous. Neck: No stridor.   Cardiovascular: Normal rate, regular rhythm. Good peripheral circulation. Grossly normal heart sounds.   Respiratory: Normal respiratory effort.  No retractions. Lungs CTAB.  Gastrointestinal: Soft and nontender. No distention. Visually closed cervix. No CMT. No adnexal tenderness or masses appreciated. No active bleeding visualized.  Musculoskeletal: No lower extremity tenderness nor edema. No gross deformities of extremities. Neurologic:  Normal speech and language. No gross focal neurologic deficits are appreciated.  Skin:  Skin is warm, dry and intact. No rash noted. Psychiatric: Mood and affect are normal. Speech and behavior are normal.  ____________________________________________   LABS (all labs  ordered are listed, but only abnormal results are displayed)  Labs Reviewed  WET PREP, GENITAL - Abnormal; Notable for the following:       Result Value   Clue Cells Wet Prep HPF POC PRESENT (*)    WBC, Wet Prep HPF POC MANY (*)    All other components within normal limits  PREGNANCY, URINE - Abnormal; Notable for the following:    Preg Test, Ur POSITIVE (*)    All other components within normal limits  COMPREHENSIVE METABOLIC PANEL - Abnormal; Notable for the following:    Potassium 3.2 (*)    Calcium 8.5 (*)    ALT 11 (*)    All other components within normal limits  CBC WITH DIFFERENTIAL/PLATELET - Abnormal; Notable for the following:    RBC 3.65 (*)    Hemoglobin 10.4 (*)    HCT 31.5 (*)    RDW 19.8 (*)    All other components within normal limits  URINALYSIS, ROUTINE W REFLEX MICROSCOPIC  ABO/RH  GC/CHLAMYDIA PROBE AMP ([place]) NOT AT [facility]   ____________________________________________   PROCEDURES  Procedure(s) performed:   Procedures  EMERGENCY DEPARTMENT [place] PREGNANCY "Study: Limited Ultrasound of the Pelvis for Pregnancy"  INDICATIONS:Pregnancy(required) and Vaginal bleeding Multiple views of the uterus and pelvic cavity were obtained in real-time with a multi-frequency probe.  APPROACH:Transabdominal   PERFORMED BY: Myself  IMAGES ARCHIVED?: Yes  LIMITATIONS: none  PREGNANCY FREE FLUID: None  ADNEXAL FINDINGS:Left ovary not seen and Right ovary not seen  PREGNANCY FINDINGS: Fetal pole present and Fetal heart activity seen  INTERPRETATION: Fetal pole present and Fetal heart activity seen   CPT Codes:  24401-02 (transabdominal OB)  768-26-52 (transvaginal OB, Reduced level of service for incomplete exam)   ____________________________________________   INITIAL IMPRESSION / ASSESSMENT AND PLAN / ED COURSE  Pertinent labs & imaging results that were available during my care of the patient were reviewed by me and considered in my medical  decision making (see chart for details).  Patient presents to the emergency department for evaluation of persistent lower abdominal cramping with spotting blood over the last 2 days. She was seen earlier this month when she was diagnosed with pregnancy. Has not followed with OB/GYN. The pregnancy is visualized in the uterus with positive fetal heart activity and movement. No free fluid appreciated. The patient's abdominal exam is benign. Plan for lab work and will perform a wet prep. She did have a urine infection during last visit. I encouraged OB/GYN follow-up whether or not she decides to continue on with the pregnancy.   09:57 AM Patient was largely normal exam. No significant vaginal bleeding appreciated. Patient is Rh positive. Normal hemoglobin and hematocrit. She does have evidence of bacterial vaginosis on her wet prep but is asymptomatic. In the setting of first trimester pregnancy will elect not to treat this. Advised that she follow-up with an OB/GYN as soon as possible to discuss either continuing with the pregnancy or elective termination. Discussed return precautions in detail.  At this time, I do not  feel there is any life-threatening condition present. I have reviewed and discussed all results (EKG, imaging, lab, urine as appropriate), exam findings with patient. I have reviewed nursing notes and appropriate previous records.  I feel the patient is safe to be discharged home without further emergent workup. Discussed usual and customary return precautions. Patient and family (if present) verbalize understanding and are comfortable with this plan.  Patient will follow-up with their primary care provider. If they do not have a primary care provider, information for follow-up has been provided to them. All questions have been answered.  ____________________________________________  FINAL CLINICAL IMPRESSION(S) / ED DIAGNOSES  Final diagnoses:  Vaginal bleeding in pregnancy  Bacterial  vaginosis     MEDICATIONS GIVEN DURING THIS VISIT:  None  NEW OUTPATIENT MEDICATIONS STARTED DURING THIS VISIT:  New Prescriptions   PRENATAL VIT-FE FUMARATE-FA (PRENATAL COMPLETE) 14-0.4 MG TABS    Take 1 tablet by mouth daily.      Note:  This document was prepared using Dragon voice recognition software and may include unintentional dictation errors.  Nanda Quinton, MD Emergency Medicine   Margette Fast, MD 03/31/16 [phone]

## 2016-04-01 LAB — GC/CHLAMYDIA PROBE AMP (~~LOC~~) NOT AT ARMC
Chlamydia: NEGATIVE
Neisseria Gonorrhea: NEGATIVE

## 2016-04-09 ENCOUNTER — Emergency Department (HOSPITAL_BASED_OUTPATIENT_CLINIC_OR_DEPARTMENT_OTHER)
Admission: EM | Admit: 2016-04-09 | Discharge: 2016-04-09 | Disposition: A | Discharge status: Home / Self Care | Payer: Medicaid Other | Attending: Emergency Medicine | Admitting: Emergency Medicine

## 2016-04-09 ENCOUNTER — Encounter: Payer: Self-pay | Admitting: Emergency Medicine

## 2016-04-09 DIAGNOSIS — I10 Essential (primary) hypertension: Secondary | ICD-10-CM | POA: Insufficient documentation

## 2016-04-09 DIAGNOSIS — H538 Other visual disturbances: Secondary | ICD-10-CM | POA: Diagnosis not present

## 2016-04-09 DIAGNOSIS — R51 Headache: Secondary | ICD-10-CM | POA: Insufficient documentation

## 2016-04-09 DIAGNOSIS — F17210 Nicotine dependence, cigarettes, uncomplicated: Secondary | ICD-10-CM | POA: Diagnosis not present

## 2016-04-09 DIAGNOSIS — N898 Other specified noninflammatory disorders of vagina: Secondary | ICD-10-CM | POA: Diagnosis not present

## 2016-04-09 DIAGNOSIS — R519 Headache, unspecified: Secondary | ICD-10-CM

## 2016-04-09 MED ORDER — DIPHENHYDRAMINE HCL 50 MG/ML IJ SOLN
25.0000 mg | Freq: Once | INTRAMUSCULAR | Status: AC
  Administered 2016-04-09: 25 mg via INTRAVENOUS
  Filled 2016-04-09: qty 1

## 2016-04-09 MED ORDER — KETOROLAC TROMETHAMINE 30 MG/ML IJ SOLN
30.0000 mg | Freq: Once | INTRAMUSCULAR | Status: AC
  Administered 2016-04-09: 30 mg via INTRAVENOUS
  Filled 2016-04-09: qty 1

## 2016-04-09 MED ORDER — SODIUM CHLORIDE 0.9 % IV BOLUS (SEPSIS)
1000.0000 mL | Freq: Once | INTRAVENOUS | Status: AC
  Administered 2016-04-09: 1000 mL via INTRAVENOUS

## 2016-04-09 MED ORDER — PROCHLORPERAZINE EDISYLATE 5 MG/ML IJ SOLN
10.0000 mg | Freq: Once | INTRAMUSCULAR | Status: AC
  Administered 2016-04-09: 10 mg via INTRAVENOUS
  Filled 2016-04-09: qty 2

## 2016-04-09 NOTE — Discharge Instructions (Signed)
Return to ED for head injury, trouble walking, numbness, weakness or excessive bleeding.

## 2016-04-09 NOTE — ED Notes (Signed)
Pt getting ready for a pelvic exam

## 2016-04-09 NOTE — ED Provider Notes (Signed)
[facility] Provider Note   CSN: [id number] Arrival date & time: 04/09/16  1453     History   Chief Complaint Chief Complaint  Patient presents with  . Headache    HPI Shirley [name] is a 31 y.o. female.  Patient presents with 2 day history of headache, rated as 10/10, described as throbbing and unalleviated with ibuprofen. Reports some blurry vision and photophobia associated with the headache.  She had an induced abortion done 7 days ago and reports excessive bleeding after the procedure, but they were able to control it prior to her discharge. She reports some bleeding with clots that began a few days ago. She is worried that her headache is related to that. She also reports checking her blood pressure at work today and it was 150/90. She has been told she has HTN but has not taken any medication for it. Denies chest pain, trouble breathing, nausea, vomiting.      Past Medical History:  Diagnosis Date  . Eczema   . Hypertension   . Low serum iron     There are no active problems to display for this patient.   Past Surgical History:  Procedure Laterality Date  . CESAREAN SECTION    . INDUCED ABORTION    . INDUCED ABORTION    . WISDOM TOOTH EXTRACTION      OB History    Gravida Para Term Preterm AB Living   10 3 1 2 3 4    SAB TAB Ectopic Multiple Live Births   1 2   1 1        Home Medications    Prior to Admission medications   Medication Sig Start Date End Date Taking? Authorizing Provider  ferrous fumarate (HEMOCYTE - 106 MG FE) 325 (106 FE) MG TABS tablet Take 1 tablet by mouth daily.     Historical Provider, MD  HYDROcodone-acetaminophen (NORCO/VICODIN) 5-325 MG tablet Take 1 tablet by mouth every 6 (six) hours as needed for severe pain. 07/31/15   Blanchie Dessert, MD  Prenatal Vit-Fe Fumarate-FA (PRENATAL COMPLETE) 14-0.4 MG TABS Take 1 tablet by mouth daily. 03/31/16 04/30/16  Margette Fast, MD    Family History Family History  Problem  Relation Age of Onset  . Cancer Maternal Grandmother     breast  . Other Neg Hx   . Hearing loss Neg Hx     Social History Social History  Substance Use Topics  . Smoking status: Current Every Day Smoker    Packs/day: 0.50    Years: 2.00    Types: Cigarettes  . Smokeless tobacco: Never Used  . Alcohol use No     Allergies   Patient has no known allergies.   Review of Systems Review of Systems  Constitutional: Negative for appetite change, chills and fever.  HENT: Negative for rhinorrhea, sneezing and sore throat.   Eyes: Positive for visual disturbance. Negative for photophobia.  Respiratory: Negative for cough, chest tightness, shortness of breath and wheezing.   Cardiovascular: Negative for chest pain and palpitations.  Gastrointestinal: Negative for nausea and vomiting.  Genitourinary: Negative for dysuria.  Musculoskeletal: Negative for myalgias.  Skin: Negative for rash.  Neurological: Positive for headaches. Negative for dizziness, weakness and light-headedness.  All other systems reviewed and are negative.    Physical Exam Updated Vital Signs BP (!) 141/72 (BP Location: Left Arm)   Pulse 70   Temp 98.7 F (37.1 C) (Oral)   Resp 16   Ht 5' (1.524  m)   Wt 70.3 kg   LMP  (LMP Unknown)   SpO2 98%   Breastfeeding? Unknown   BMI 30.27 kg/m   Physical Exam  Constitutional: She is oriented to person, place, and time. She appears well-developed and well-nourished. No distress.  HENT:  Head: Normocephalic and atraumatic.  Nose: Nose normal.  Eyes: Conjunctivae and EOM are normal. Pupils are equal, round, and reactive to light. Right eye exhibits no discharge. Left eye exhibits no discharge. No scleral icterus.  Neck: Normal range of motion. Neck supple.  Cardiovascular: Normal rate, regular rhythm, normal heart sounds and intact distal pulses.  Exam reveals no gallop and no friction rub.   No murmur heard. Pulmonary/Chest: Effort normal and breath sounds  normal. No respiratory distress.  Abdominal: Soft. Bowel sounds are normal. She exhibits no distension. There is no tenderness. There is no guarding.  Genitourinary: There is no rash or lesion on the right labia. There is no rash or lesion on the left labia. Cervix exhibits no motion tenderness and no friability. Right adnexum displays no tenderness. Left adnexum displays no tenderness. There is bleeding (Dark red bleeding) in the vagina.  Genitourinary Comments: Bleeding present but no evidence of retained tissue.  Musculoskeletal: Normal range of motion. She exhibits no edema.  Lymphadenopathy:    She has no cervical adenopathy.  Neurological: She is alert and oriented to person, place, and time. She has normal strength. No cranial nerve deficit or sensory deficit. She exhibits normal muscle tone. Coordination normal.  Skin: Skin is warm and dry. No rash noted. She is not diaphoretic.  Psychiatric: She has a normal mood and affect.  Nursing note and vitals reviewed.    ED Treatments / Results  Labs (all labs ordered are listed, but only abnormal results are displayed) Labs Reviewed - No data to display  EKG  EKG Interpretation None       Radiology No results found.  Procedures Pelvic exam Date/Time: 04/10/2016 2:08 AM Performed by: Delia Heady Authorized by: Delia Heady  Consent: Verbal consent obtained. Consent given by: patient Patient tolerance: Patient tolerated the procedure well with no immediate complications    (including critical care time)  Medications Ordered in ED Medications  diphenhydrAMINE (BENADRYL) injection 25 mg (25 mg Intravenous Given 04/09/16 1640)  ketorolac (TORADOL) 30 MG/ML injection 30 mg (30 mg Intravenous Given 04/09/16 1641)  prochlorperazine (COMPAZINE) injection 10 mg (10 mg Intravenous Given 04/09/16 1641)  sodium chloride 0.9 % bolus 1,000 mL (0 mLs Intravenous Stopped 04/09/16 1804)     Initial Impression / Assessment and Plan / ED Course   I have reviewed the triage vital signs and the nursing notes.  Pertinent labs & imaging results that were available during my care of the patient were reviewed by me and considered in my medical decision making (see chart for details).     Patient's history and symptoms concerning for tension HA vs. Migraine HA vs. Bleeding from retained tissue s/p induced abortion. She exhibits classic findings of migraine headache. Given migraine cocktail and IVF in ED with alleviation of symptoms. Since patient is experiencing bleeding with clots, she is requesting a pelvic exam. This revealed bleeding but no evidence of retained tissue. Patient was reassured that bleeding could continue for a few more days, which is normal. She understood and agreed to follow up if any worsening of symptoms.  Return precautions given.  Final Clinical Impressions(s) / ED Diagnoses   Final diagnoses:  Nonintractable headache, unspecified chronicity pattern,  unspecified headache type    New Prescriptions Discharge Medication List as of 04/09/2016  5:58 PM       Cem Kosman Shelly Coss, PA-C 04/10/16 [id number]    Julianne Rice, MD 04/10/16 [phone]

## 2016-04-09 NOTE — ED Triage Notes (Signed)
Pt reports headache and hypertension since Monday. Reports some nausea. Pt did have induced abortion last week. Reports taking BP today and it was 150/98. Pt reports hx of HTN 3 years ago.

## 2016-08-29 ENCOUNTER — Encounter (HOSPITAL_BASED_OUTPATIENT_CLINIC_OR_DEPARTMENT_OTHER): Payer: Self-pay | Admitting: *Deleted

## 2016-08-29 ENCOUNTER — Emergency Department (HOSPITAL_BASED_OUTPATIENT_CLINIC_OR_DEPARTMENT_OTHER)
Admission: EM | Admit: 2016-08-29 | Discharge: 2016-08-29 | Disposition: A | Discharge status: Home / Self Care | Payer: Medicaid Other | Attending: Emergency Medicine | Admitting: Emergency Medicine

## 2016-08-29 ENCOUNTER — Emergency Department (HOSPITAL_BASED_OUTPATIENT_CLINIC_OR_DEPARTMENT_OTHER): Payer: Medicaid Other

## 2016-08-29 DIAGNOSIS — I10 Essential (primary) hypertension: Secondary | ICD-10-CM | POA: Diagnosis not present

## 2016-08-29 DIAGNOSIS — F17210 Nicotine dependence, cigarettes, uncomplicated: Secondary | ICD-10-CM | POA: Insufficient documentation

## 2016-08-29 DIAGNOSIS — Z79899 Other long term (current) drug therapy: Secondary | ICD-10-CM | POA: Diagnosis not present

## 2016-08-29 DIAGNOSIS — R109 Unspecified abdominal pain: Secondary | ICD-10-CM | POA: Diagnosis present

## 2016-08-29 DIAGNOSIS — N83202 Unspecified ovarian cyst, left side: Secondary | ICD-10-CM | POA: Diagnosis not present

## 2016-08-29 LAB — WET PREP, GENITAL
Clue Cells Wet Prep HPF POC: NONE SEEN
Sperm: NONE SEEN
Trich, Wet Prep: NONE SEEN
Yeast Wet Prep HPF POC: NONE SEEN

## 2016-08-29 LAB — URINALYSIS, ROUTINE W REFLEX MICROSCOPIC
Bilirubin Urine: NEGATIVE
Glucose, UA: NEGATIVE mg/dL
Hgb urine dipstick: NEGATIVE
Ketones, ur: NEGATIVE mg/dL
Leukocytes, UA: NEGATIVE
Nitrite: NEGATIVE
Protein, ur: NEGATIVE mg/dL
Specific Gravity, Urine: 1.003 — ABNORMAL LOW (ref 1.005–1.030)
pH: 6 (ref 5.0–8.0)

## 2016-08-29 LAB — PREGNANCY, URINE: Preg Test, Ur: NEGATIVE

## 2016-08-29 NOTE — ED Provider Notes (Signed)
[facility] DEPT [facility] Provider Note   CSN: [id number] Arrival date & time: 08/29/16  1301     History   Chief Complaint Chief Complaint  Patient presents with  . Abdominal Pain    HPI Shirley [name] is a 31 y.o. female.  HPI patient has crampy lower abdominal pain. Feels like when she has had delivery cramps in the past. Began a couple days ago. Worse with standing up. May have the pain that goes little more to her left abdomen. Around a week ago she started her menses. States it was a little heavier than normal. States she had a an abortion back in March. States that since then when she's had her menses is been heavier but not as heavy as it was earlier this week. States it was not painful at that time. States she has had more frequent bowel movements today. No diarrhea. He has some urinary frequency also. No vaginal discharge. Denies chance of STD. States last sex was a month ago and he was clean.  Past Medical History:  Diagnosis Date  . Eczema   . Hypertension   . Low serum iron     There are no active problems to display for this patient.   Past Surgical History:  Procedure Laterality Date  . CESAREAN SECTION    . INDUCED ABORTION    . INDUCED ABORTION    . WISDOM TOOTH EXTRACTION      OB History    Gravida Para Term Preterm AB Living   10 3 1 2 3 4    SAB TAB Ectopic Multiple Live Births   1 2   1 1        Home Medications    Prior to Admission medications   Medication Sig Start Date End Date Taking? Authorizing Provider  ferrous fumarate (HEMOCYTE - 106 MG FE) 325 (106 FE) MG TABS tablet Take 1 tablet by mouth daily.    Yes [provider]  HYDROcodone-acetaminophen (NORCO/VICODIN) 5-325 MG tablet Take 1 tablet by mouth every 6 (six) hours as needed for severe pain. 07/31/15   Blanchie Dessert, MD    Family History Family History  Problem Relation Age of Onset  . Cancer Maternal Grandmother        breast  . Other Neg Hx   . Hearing loss Neg  Hx     Social History Social History  Substance Use Topics  . Smoking status: Current Every Day Smoker    Packs/day: 0.50    Years: 2.00    Types: Cigarettes  . Smokeless tobacco: Never Used  . Alcohol use No     Allergies   Patient has no known allergies.   Review of Systems Review of Systems  Constitutional: Negative for appetite change.  HENT: Negative for congestion.   Respiratory: Negative for shortness of breath.   Cardiovascular: Negative for chest pain.  Gastrointestinal: Positive for abdominal pain.  Genitourinary: Positive for frequency and pelvic pain. Negative for difficulty urinating, dyspareunia, vaginal bleeding and vaginal discharge.  Skin: Negative for rash.  Neurological: Negative for numbness.  Hematological: Negative for adenopathy.  Psychiatric/Behavioral: Negative for confusion.     Physical Exam Updated Vital Signs BP (!) 142/95 (BP Location: Left Arm)   Pulse 72   Temp 98.5 F (36.9 C) (Oral)   Resp 16   Ht 5' (1.524 m)   Wt 68 kg (150 lb)   LMP 08/23/2016   SpO2 100%   BMI 29.29 kg/m   Physical Exam  Constitutional: She appears well-developed.  HENT:  Head: Atraumatic.  Neck: Neck supple.  Cardiovascular: Normal rate.   Pulmonary/Chest: Effort normal.  Abdominal: She exhibits no mass. There is no tenderness. There is no rebound.  Genitourinary:  Genitourinary Comments: No external lesions. Minimal white vaginal discharge. No cervical discharge. Left adnexal tenderness with mild cervical motion tenderness.  Musculoskeletal: She exhibits no edema.  Neurological: She is alert.  Skin: Skin is warm. Capillary refill takes less than 2 seconds.     ED Treatments / Results  Labs (all labs ordered are listed, but only abnormal results are displayed) Labs Reviewed  WET PREP, GENITAL - Abnormal; Notable for the following:       Result Value   WBC, Wet Prep HPF POC MODERATE (*)    All other components within normal limits    URINALYSIS, ROUTINE W REFLEX MICROSCOPIC - Abnormal; Notable for the following:    Specific Gravity, Urine 1.003 (*)    All other components within normal limits  PREGNANCY, URINE  RPR  HIV ANTIBODY (ROUTINE TESTING)  GC/CHLAMYDIA PROBE AMP ([place]) NOT AT [facility]    EKG  EKG Interpretation None       Radiology US Transvaginal Non-ob  Result Date: 08/29/2016 CLINICAL DATA:  Left abdominal pain x 2-3 days EXAM: TRANSABDOMINAL AND TRANSVAGINAL ULTRASOUND OF PELVIS DOPPLER ULTRASOUND OF OVARIES TECHNIQUE: Both transabdominal and transvaginal ultrasound examinations of the pelvis were performed. Transabdominal technique was performed for global imaging of the pelvis including uterus, ovaries, adnexal regions, and pelvic cul-de-sac. It was necessary to proceed with endovaginal exam following the transabdominal exam to visualize the endometrium. Color and duplex Doppler ultrasound was utilized to evaluate blood flow to the ovaries. COMPARISON:  07/31/2015 FINDINGS: Uterus Measurements: 10.3 x 5.1 x 7.0 cm. No fibroids or other mass visualized. Endometrium Thickness: 12 mm.  No focal abnormality visualized. Right ovary Measurements: 3.7 x 1.7 x 2.3 cm. Normal appearance/no adnexal mass. Left ovary Measurements: 3.4 x 1.9 x 2.6 cm. 1.5 cm corpus luteal cyst. Pulsed Doppler evaluation of the left ovary demonstrates normal low-resistance arterial and venous waveforms. Normal color Doppler flow in the right ovary. Unable to perform spectral Doppler evaluation due to adjacent bowel loop. Other findings Trace pelvic fluid. IMPRESSION: No evidence of ovarian torsion. 1.5 cm left corpus luteal cyst, physiologic. Electronically Signed   By: Julian Hy M.D.   On: 08/29/2016 18:15   US Pelvis Complete  Result Date: 08/29/2016 CLINICAL DATA:  Left abdominal pain x 2-3 days EXAM: TRANSABDOMINAL AND TRANSVAGINAL ULTRASOUND OF PELVIS DOPPLER ULTRASOUND OF OVARIES TECHNIQUE: Both transabdominal and  transvaginal ultrasound examinations of the pelvis were performed. Transabdominal technique was performed for global imaging of the pelvis including uterus, ovaries, adnexal regions, and pelvic cul-de-sac. It was necessary to proceed with endovaginal exam following the transabdominal exam to visualize the endometrium. Color and duplex Doppler ultrasound was utilized to evaluate blood flow to the ovaries. COMPARISON:  07/31/2015 FINDINGS: Uterus Measurements: 10.3 x 5.1 x 7.0 cm. No fibroids or other mass visualized. Endometrium Thickness: 12 mm.  No focal abnormality visualized. Right ovary Measurements: 3.7 x 1.7 x 2.3 cm. Normal appearance/no adnexal mass. Left ovary Measurements: 3.4 x 1.9 x 2.6 cm. 1.5 cm corpus luteal cyst. Pulsed Doppler evaluation of the left ovary demonstrates normal low-resistance arterial and venous waveforms. Normal color Doppler flow in the right ovary. Unable to perform spectral Doppler evaluation due to adjacent bowel loop. Other findings Trace pelvic fluid. IMPRESSION: No evidence of ovarian torsion.  1.5 cm left corpus luteal cyst, physiologic. Electronically Signed   By: Julian Hy M.D.   On: 08/29/2016 18:15   [place] Art/ven Flow Abd Pelv Doppler  Result Date: 08/29/2016 CLINICAL DATA:  Left abdominal pain x 2-3 days EXAM: TRANSABDOMINAL AND TRANSVAGINAL ULTRASOUND OF PELVIS DOPPLER ULTRASOUND OF OVARIES TECHNIQUE: Both transabdominal and transvaginal ultrasound examinations of the pelvis were performed. Transabdominal technique was performed for global imaging of the pelvis including uterus, ovaries, adnexal regions, and pelvic cul-de-sac. It was necessary to proceed with endovaginal exam following the transabdominal exam to visualize the endometrium. Color and duplex Doppler ultrasound was utilized to evaluate blood flow to the ovaries. COMPARISON:  07/31/2015 FINDINGS: Uterus Measurements: 10.3 x 5.1 x 7.0 cm. No fibroids or other mass visualized. Endometrium Thickness: 12  mm.  No focal abnormality visualized. Right ovary Measurements: 3.7 x 1.7 x 2.3 cm. Normal appearance/no adnexal mass. Left ovary Measurements: 3.4 x 1.9 x 2.6 cm. 1.5 cm corpus luteal cyst. Pulsed Doppler evaluation of the left ovary demonstrates normal low-resistance arterial and venous waveforms. Normal color Doppler flow in the right ovary. Unable to perform spectral Doppler evaluation due to adjacent bowel loop. Other findings Trace pelvic fluid. IMPRESSION: No evidence of ovarian torsion. 1.5 cm left corpus luteal cyst, physiologic. Electronically Signed   By: Julian Hy M.D.   On: 08/29/2016 18:15    Procedures Procedures (including critical care time)  Medications Ordered in ED Medications - No data to display   Initial Impression / Assessment and Plan / ED Course  I have reviewed the triage vital signs and the nursing notes.  Pertinent labs & imaging results that were available during my care of the patient were reviewed by me and considered in my medical decision making (see chart for details).     Patient with pelvic pain. Left adnexal tenderness but otherwise benign exam. Not pregnant. Some white count on what prep but really no discharge. Doubt other intra-abdominal infection. Corpus luteum cyst on ultrasound. Will discharge home.  Final Clinical Impressions(s) / ED Diagnoses   Final diagnoses:  Cyst of left ovary    New Prescriptions New Prescriptions   No medications on file     Davonna Belling, MD 08/29/16 [phone]

## 2016-08-29 NOTE — ED Triage Notes (Signed)
Abdominal pain in her lower abdomen and back. Pressure in her vagina when she stands. Her menses last week was heavier than normal.

## 2016-08-30 LAB — HIV ANTIBODY (ROUTINE TESTING W REFLEX): HIV Screen 4th Generation wRfx: NONREACTIVE

## 2016-08-30 LAB — RPR: RPR Ser Ql: NONREACTIVE

## 2016-09-01 LAB — GC/CHLAMYDIA PROBE AMP (~~LOC~~) NOT AT ARMC
Chlamydia: NEGATIVE
Neisseria Gonorrhea: NEGATIVE

## 2016-09-02 ENCOUNTER — Ambulatory Visit: Payer: Medicaid Other | Admitting: Obstetrics

## 2016-12-10 ENCOUNTER — Encounter (HOSPITAL_BASED_OUTPATIENT_CLINIC_OR_DEPARTMENT_OTHER): Payer: Self-pay

## 2016-12-10 ENCOUNTER — Emergency Department (HOSPITAL_BASED_OUTPATIENT_CLINIC_OR_DEPARTMENT_OTHER)
Admission: EM | Admit: 2016-12-10 | Discharge: 2016-12-10 | Disposition: A | Discharge status: Home / Self Care | Payer: Self-pay | Attending: Emergency Medicine | Admitting: Emergency Medicine

## 2016-12-10 ENCOUNTER — Other Ambulatory Visit: Payer: Self-pay

## 2016-12-10 DIAGNOSIS — B9789 Other viral agents as the cause of diseases classified elsewhere: Secondary | ICD-10-CM | POA: Insufficient documentation

## 2016-12-10 DIAGNOSIS — J029 Acute pharyngitis, unspecified: Secondary | ICD-10-CM

## 2016-12-10 DIAGNOSIS — J069 Acute upper respiratory infection, unspecified: Secondary | ICD-10-CM | POA: Insufficient documentation

## 2016-12-10 LAB — RAPID STREP SCREEN (MED CTR MEBANE ONLY): Streptococcus, Group A Screen (Direct): NEGATIVE

## 2016-12-10 MED ORDER — DM-GUAIFENESIN ER 30-600 MG PO TB12: 1.0000 | ORAL_TABLET | Freq: Two times a day (BID) | ORAL | 0 refills | Status: DC

## 2016-12-10 MED ORDER — IBUPROFEN 600 MG PO TABS: 600.0000 mg | ORAL_TABLET | Freq: Four times a day (QID) | ORAL | 0 refills | Status: DC | PRN

## 2016-12-10 MED ORDER — IBUPROFEN 400 MG PO TABS
600.0000 mg | ORAL_TABLET | Freq: Once | ORAL | Status: AC
  Administered 2016-12-10: 600 mg via ORAL
  Filled 2016-12-10: qty 1

## 2016-12-10 MED FILL — MUCINEX DM ER 600-30 MG TAB: 30-600 | 10 days supply | Qty: 20 | Fill #0

## 2016-12-10 NOTE — ED Triage Notes (Signed)
C/o flu like sx x 2 days-NAD-steady gait 

## 2016-12-10 NOTE — ED Provider Notes (Signed)
[facility] EMERGENCY DEPARTMENT Provider Note   CSN: [id number] Arrival date & time: 12/10/16  1414     History   Chief Complaint Chief Complaint  Patient presents with  . Cough    HPI Shirley [name] is a 31 y.o. female.  HPI   Patient is a 31 year old female with a history of eczema, hypertension presenting for cough, congestion, and sore throat for 24 hours.  Patient reports that her symptoms began with a sore throat.  Patient reports that it hurts to swallow, however she does not have any difficulty swallowing or breathing.  Patient is also noted a mild headache during the course of this illness which she does not have at present.  Patient then developed some congestion and a dry cough today no sputum production or hemoptysis.  Patient has not been running fevers at home.  No chills.  No nausea or vomiting.  No medications taken at home for symptoms.  Past Medical History:  Diagnosis Date  . Eczema   . Hypertension   . Low serum iron     There are no active problems to display for this patient.   Past Surgical History:  Procedure Laterality Date  . CESAREAN SECTION    . INDUCED ABORTION    . INDUCED ABORTION    . WISDOM TOOTH EXTRACTION      OB History    Gravida Para Term Preterm AB Living   10 3 1 2 3 4    SAB TAB Ectopic Multiple Live Births   1 2   1 1        Home Medications    Prior to Admission medications   Medication Sig Start Date End Date Taking? Authorizing Provider  dextromethorphan-guaiFENesin (MUCINEX DM) 30-600 MG 12hr tablet Take 1 tablet by mouth 2 (two) times daily. 12/10/16   Langston Masker B, PA-C  ibuprofen (ADVIL,MOTRIN) 600 MG tablet Take 1 tablet (600 mg total) by mouth every 6 (six) hours as needed. 12/10/16   Albesa Seen, PA-C    Family History Family History  Problem Relation Age of Onset  . Cancer Maternal Grandmother        breast  . Other Neg Hx   . Hearing loss Neg Hx     Social History Social History     Tobacco Use  . Smoking status: Current Every Day Smoker    Packs/day: 0.50    Years: 2.00    Pack years: 1.00    Types: Cigarettes  . Smokeless tobacco: Never Used  Substance Use Topics  . Alcohol use: No  . Drug use: No     Allergies   Patient has no known allergies.   Review of Systems Review of Systems  Constitutional: Negative for chills and fever.  HENT: Positive for congestion, sneezing and sore throat. Negative for sinus pressure, trouble swallowing and voice change.   Respiratory: Positive for cough. Negative for shortness of breath.   Gastrointestinal: Negative for nausea and vomiting.  Musculoskeletal: Positive for myalgias.     Physical Exam Updated Vital Signs BP (!) 149/91 (BP Location: Left Arm)   Pulse 79   Temp 98.7 F (37.1 C) (Oral)   Resp 18   Ht 5\' 1"  (1.549 m)   Wt 69.9 kg (154 lb)   LMP 12/01/2016   SpO2 100%   BMI 29.10 kg/m   Physical Exam  Constitutional: She appears well-developed and well-nourished. No distress.  Sitting comfortably in bed.  HENT:  Head: Normocephalic and  atraumatic.  Normal phonation. No muffled voice sounds. Patient swallows secretions without difficulty. Dentition normal. No lesions of tongue or buccal mucosa. Uvula midline. No asymmetric swelling of the posterior pharynx. Minimal erythema of posterior pharynx. No tonsillar exuduate. No lingual swelling. No induration inferior to tongue. No submandibular tenderness, swelling, or induration.  Tissues of the neck supple. No cervical lymphadenopathy.  Eyes: Conjunctivae are normal. Right eye exhibits no discharge. Left eye exhibits no discharge.  EOMs normal to gross examination.  Neck: Normal range of motion.  Cardiovascular: Normal rate, regular rhythm and normal heart sounds.  No murmur heard. Pulmonary/Chest: Effort normal and breath sounds normal.  Normal respiratory effort. Patient converses comfortably. No audible wheeze or stridor.  Abdominal: She exhibits  no distension.  Musculoskeletal: Normal range of motion.  Neurological: She is alert.  Cranial nerves intact to gross observation. Patient moves extremities without difficulty.  Skin: Skin is warm and dry. She is not diaphoretic.  Psychiatric: She has a normal mood and affect. Her behavior is normal. Judgment and thought content normal.  Nursing note and vitals reviewed.    ED Treatments / Results  Labs (all labs ordered are listed, but only abnormal results are displayed) Labs Reviewed  RAPID STREP SCREEN (NOT AT [facility])  CULTURE, GROUP A STREP [facility])    EKG  EKG Interpretation None       Radiology No results found.  Procedures Procedures (including critical care time)  Medications Ordered in ED Medications  ibuprofen (ADVIL,MOTRIN) tablet 600 mg (600 mg Oral Given 12/10/16 1611)     Initial Impression / Assessment and Plan / ED Course  I have reviewed the triage vital signs and the nursing notes.  Pertinent labs & imaging results that were available during my care of the patient were reviewed by me and considered in my medical decision making (see chart for details).     Final Clinical Impressions(s) / ED Diagnoses   Final diagnoses:  Viral URI with cough  Sore throat   Patient is nontoxic-appearing, afebrile, and in no acute distress.  Patient did not take antipyretics prior to arrival.  Patient with likely viral illness.  I discussed with patient utility of influenza testing and treatment.  Patient has no chronic lung diseases.  Will treat with symptomatic therapy.  Rapid strep negative today.  There is no evidence of pharyngeal swelling, semitubular tenderness, or other symptoms of Ludewig's angina or other evidence of retropharyngeal abscess.  Will treat with Mucinex and ibuprofen and Tylenol alternating for throat pain.  Return precautions given for any worsening symptoms, recurrent fevers, focal chest pain or shortness of breath.  Patient is in understanding  and agrees with the plan of care.  Blood pressure is elevated today.  I noted this to patient.  Patient counseled to have this rechecked by primary care provider.  ED Discharge Orders        Ordered    dextromethorphan-guaiFENesin [facility] DM) 30-600 MG 12hr tablet  2 times daily     12/10/16 1611    ibuprofen (ADVIL,MOTRIN) 600 MG tablet  Every 6 hours PRN     12/10/16 1611       Tamala Julian 12/10/16 1721    Tanna Furry, MD 12/17/16 1524

## 2016-12-10 NOTE — Discharge Instructions (Signed)
Please read and follow all provided instructions.  Your diagnoses today include:  1. Viral URI with cough     You appear to have an upper respiratory infection (URI). An upper respiratory tract infection, or cold, is a viral infection of the air passages leading to the lungs. It should improve gradually after 5-7 days. You may have a lingering cough that lasts for 2- 4 weeks after the infection.  Tests performed today include: Vital signs. See below for your results today.   Medications prescribed:   Take any prescribed medications only as directed. Treatment for your infection is aimed at treating the symptoms. There are no medications, such as antibiotics, that will cure your infection.   Home care instructions:  Follow any educational materials contained in this packet.   Your illness is contagious and can be spread to others, especially during the first 3 or 4 days. It cannot be cured by antibiotics or other medicines. Take basic precautions such as washing your hands often, covering your mouth when you cough or sneeze, and avoiding public places where you could spread your illness to others.   Please continue drinking plenty of fluids.  Use over-the-counter medicines as needed as directed on packaging for symptom relief.  You may also use ibuprofen or tylenol as directed on packaging for pain or fever.  Do not take multiple medicines containing Tylenol or acetaminophen to avoid taking too much of this medication.  Follow-up instructions: Please follow-up with your primary care provider in the next 3 days for further evaluation of your symptoms if you are not feeling better.   Return instructions:  Please return to the Emergency Department if you experience worsening symptoms.  RETURN IMMEDIATELY IF you develop shortness of breath, confusion or altered mental status, a new rash, become dizzy, faint, or poorly responsive, or are unable to be cared for at home. Please return if you have  persistent vomiting and cannot keep down fluids or develop a fever that is not controlled by tylenol or motrin.   Please return if you have any other emergent concerns.  Additional Information:  Your vital signs today were: BP (!) 149/91 (BP Location: Left Arm)    Pulse 79    Temp 98.7 F (37.1 C) (Oral)    Resp 18    Ht 5\' 1"  (1.549 m)    Wt 69.9 kg (154 lb)    LMP 12/01/2016    SpO2 100%    BMI 29.10 kg/m  If your blood pressure (BP) was elevated above 135/85 this visit, please have this repeated by your doctor within one month. --------------

## 2016-12-13 LAB — CULTURE, GROUP A STREP (THRC)

## 2017-02-27 ENCOUNTER — Emergency Department (HOSPITAL_BASED_OUTPATIENT_CLINIC_OR_DEPARTMENT_OTHER)
Admission: EM | Admit: 2017-02-27 | Discharge: 2017-02-27 | Disposition: A | Discharge status: Home / Self Care | Payer: Self-pay | Attending: Emergency Medicine | Admitting: Emergency Medicine

## 2017-02-27 ENCOUNTER — Encounter (HOSPITAL_BASED_OUTPATIENT_CLINIC_OR_DEPARTMENT_OTHER): Payer: Self-pay | Admitting: *Deleted

## 2017-02-27 ENCOUNTER — Other Ambulatory Visit: Payer: Self-pay

## 2017-02-27 DIAGNOSIS — R519 Headache, unspecified: Secondary | ICD-10-CM

## 2017-02-27 DIAGNOSIS — R51 Headache: Secondary | ICD-10-CM

## 2017-02-27 DIAGNOSIS — F17210 Nicotine dependence, cigarettes, uncomplicated: Secondary | ICD-10-CM | POA: Insufficient documentation

## 2017-02-27 DIAGNOSIS — R059 Cough, unspecified: Secondary | ICD-10-CM

## 2017-02-27 DIAGNOSIS — A084 Viral intestinal infection, unspecified: Secondary | ICD-10-CM

## 2017-02-27 DIAGNOSIS — R05 Cough: Secondary | ICD-10-CM | POA: Insufficient documentation

## 2017-02-27 DIAGNOSIS — I10 Essential (primary) hypertension: Secondary | ICD-10-CM | POA: Insufficient documentation

## 2017-02-27 DIAGNOSIS — Z79899 Other long term (current) drug therapy: Secondary | ICD-10-CM | POA: Insufficient documentation

## 2017-02-27 LAB — URINALYSIS, ROUTINE W REFLEX MICROSCOPIC
Bilirubin Urine: NEGATIVE
Glucose, UA: NEGATIVE mg/dL
Hgb urine dipstick: NEGATIVE
Ketones, ur: NEGATIVE mg/dL
Leukocytes, UA: NEGATIVE
Nitrite: NEGATIVE
Protein, ur: NEGATIVE mg/dL
Specific Gravity, Urine: 1.015 (ref 1.005–1.030)
pH: 5.5 (ref 5.0–8.0)

## 2017-02-27 LAB — PREGNANCY, URINE: Preg Test, Ur: NEGATIVE

## 2017-02-27 MED ORDER — DICYCLOMINE HCL 20 MG PO TABS: 20.0000 mg | ORAL_TABLET | Freq: Two times a day (BID) | ORAL | 0 refills | Status: DC | PRN

## 2017-02-27 MED FILL — DICYCLOMINE 20 MG TABLET: 20 | 7 days supply | Qty: 14 | Fill #0

## 2017-02-27 NOTE — ED Provider Notes (Signed)
[facility] EMERGENCY DEPARTMENT Provider Note   CSN: [id number] Arrival date & time: 02/27/17  1202     History   Chief Complaint Chief Complaint  Patient presents with  . Headache  . Chills    HPI Shirley [name] is a 32 y.o. female.  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  Son 15yo with similar symptoms today.  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 Icy hot lidocaine helped    Past Medical History:  Diagnosis Date  . Eczema   . Hypertension   . Low serum iron     There are no active problems to display for this patient.   Past Surgical History:  Procedure Laterality Date  . CESAREAN SECTION    . INDUCED ABORTION    . INDUCED ABORTION    . WISDOM TOOTH EXTRACTION      OB History    Gravida Para Term Preterm AB Living   10 3 1 2 3 4    SAB TAB Ectopic Multiple Live Births   1 2   1 1        Home Medications    Prior to Admission medications   Medication Sig Start Date End Date Taking? Authorizing Provider  IRON PO Take by mouth.   Yes [provider]  dextromethorphan-guaiFENesin (MUCINEX DM) 30-600 MG 12hr tablet Take 1 tablet by mouth 2 (two) times daily. 12/10/16   Langston Masker B, PA-C  dicyclomine (BENTYL) 20  MG tablet Take 1 tablet (20 mg total) by mouth 2 (two) times daily as needed for spasms (stomach cramping). 02/27/17   Gareth Morgan, MD  ibuprofen (ADVIL,MOTRIN) 600 MG tablet Take 1 tablet (600 mg total) by mouth every 6 (six) hours as needed. 12/10/16   Albesa Seen, PA-C    Family History Family History  Problem Relation Age of Onset  . Cancer Maternal Grandmother        breast  . Other Neg Hx   . Hearing loss Neg Hx     Social History Social History   Tobacco Use  . Smoking status: Current Every Day Smoker    Packs/day: 0.50    Years: 2.00    Pack years: 1.00    Types: Cigarettes  . Smokeless tobacco: Never Used  Substance Use Topics  . Alcohol use: No  . Drug use: No     Allergies   Patient has no known allergies.   Review of Systems Review of Systems  Constitutional: Positive for fatigue. Negative for fever.  HENT: Negative for sore throat (now improved).   Eyes: Negative for visual disturbance.  Respiratory: Positive for cough. Negative for shortness of breath.   Cardiovascular: Positive for chest pain.  Gastrointestinal: Positive for abdominal pain, diarrhea and nausea. Negative for  vomiting.  Genitourinary: Negative for difficulty urinating and dysuria.  Musculoskeletal: Negative for back pain and neck pain.  Skin: Negative for rash.  Neurological: Positive for headaches. Negative for syncope, weakness and numbness.     Physical Exam Updated Vital Signs BP (!) 156/103   Pulse 81   Temp 99.3 F (37.4 C) (Oral)   Resp 16   Ht 5\' 1"  (1.549 m)   Wt 71.2 kg (157 lb)   LMP 02/14/2017   SpO2 100%   BMI 29.66 kg/m   Physical Exam  Constitutional: She is oriented to person, place, and time. She appears well-developed and well-nourished. No distress.  HENT:  Head: Normocephalic and atraumatic.  Eyes: Conjunctivae and EOM are normal.  Neck: Normal range of motion.  Cardiovascular: Normal rate, regular rhythm, normal heart sounds and intact  distal pulses. Exam reveals no gallop and no friction rub.  No murmur heard. Pulmonary/Chest: Effort normal and breath sounds normal. No respiratory distress. She has no wheezes. She has no rales.  Abdominal: Soft. She exhibits no distension. There is tenderness (mild diffuse/bilat lower). There is no guarding.  Musculoskeletal: She exhibits no edema or tenderness.  Neurological: She is alert and oriented to person, place, and time.  Skin: Skin is warm and dry. No rash noted. She is not diaphoretic. No erythema.  Nursing note and vitals reviewed.    ED Treatments / Results  Labs (all labs ordered are listed, but only abnormal results are displayed) Labs Reviewed  URINALYSIS, ROUTINE W REFLEX MICROSCOPIC  PREGNANCY, URINE    EKG  EKG Interpretation  Date/Time:  Friday February 27 2017 12:56:44 EST Ventricular Rate:  74 PR Interval:    QRS Duration: 82 QT Interval:  427 QTC Calculation: 474 R Axis:   85 Text Interpretation:  Sinus rhythm Baseline wander in lead(s) V2 V5 No significant change since last tracing Confirmed by Gareth Morgan [phone]) on 02/27/2017 1:00:55 PM       Radiology No results found.  Procedures Procedures (including critical care time)  Medications Ordered in ED Medications - No data to display   Initial Impression / Assessment and Plan / ED Course  I have reviewed the triage vital signs and the nursing notes.  Pertinent labs & imaging results that were available during my care of the patient were reviewed by me and considered in my medical decision making (see chart for details).     Female present for chills, diarrhea and abdominal pain.  Abdominal exam is benign, and have low suspicion for appendicitis, diverticulitis.  Urinalysis shows no sign of urinary tract infection.  Pregnancy test is negative.  She is without tachycardia, tolerating p.o., do not feel that labs will change course of care at this time.  Son has similar symptoms, and  suspect most likely viral gastroenteritis.  She has had cough also for 1 week, has no hypoxia, clear breath sounds bilaterally, multiple other symptoms and have low suspicion for pneumonia.  Reports headache that has been coming and going, which she associates possibly with her glasses.  History is not consistent with meningitis or cerebral hemorrhage.  She was given a prescription for Bentyl for stomach cramping.  Recommend continued hydration, supportive care and primary care physician follow-up.  Final Clinical Impressions(s) / ED Diagnoses   Final diagnoses:  Viral gastroenteritis  Cough  Acute nonintractable headache, unspecified headache type    ED Discharge Orders        Ordered    dicyclomine (BENTYL) 20 MG tablet  2 times daily PRN     02/27/17 1257       Gareth Morgan, MD 02/27/17 1958

## 2017-02-27 NOTE — ED Triage Notes (Signed)
Headache, chills, and diarrhea since yesterday.

## 2017-03-27 ENCOUNTER — Encounter: Payer: Self-pay | Admitting: *Deleted

## 2017-04-01 ENCOUNTER — Ambulatory Visit: Payer: Medicaid Other | Admitting: Obstetrics

## 2017-04-02 ENCOUNTER — Ambulatory Visit (INDEPENDENT_AMBULATORY_CARE_PROVIDER_SITE_OTHER): Payer: Medicaid Other | Admitting: Obstetrics

## 2017-04-02 ENCOUNTER — Other Ambulatory Visit (HOSPITAL_COMMUNITY)
Admission: RE | Admit: 2017-04-02 | Discharge: 2017-04-02 | Disposition: A | Discharge status: Home / Self Care | Payer: Medicaid Other | Source: Ambulatory Visit | Attending: Obstetrics | Admitting: Obstetrics

## 2017-04-02 ENCOUNTER — Encounter: Payer: Self-pay | Admitting: Obstetrics

## 2017-04-02 VITALS — BP 151/99 | HR 87 | Ht 61.0 in | Wt 156.0 lb

## 2017-04-02 DIAGNOSIS — Z124 Encounter for screening for malignant neoplasm of cervix: Secondary | ICD-10-CM | POA: Insufficient documentation

## 2017-04-02 DIAGNOSIS — Z01419 Encounter for gynecological examination (general) (routine) without abnormal findings: Secondary | ICD-10-CM

## 2017-04-02 DIAGNOSIS — I10 Essential (primary) hypertension: Secondary | ICD-10-CM

## 2017-04-02 DIAGNOSIS — F17210 Nicotine dependence, cigarettes, uncomplicated: Secondary | ICD-10-CM

## 2017-04-02 DIAGNOSIS — N946 Dysmenorrhea, unspecified: Secondary | ICD-10-CM

## 2017-04-02 DIAGNOSIS — Z113 Encounter for screening for infections with a predominantly sexual mode of transmission: Secondary | ICD-10-CM | POA: Diagnosis present

## 2017-04-02 DIAGNOSIS — Z309 Encounter for contraceptive management, unspecified: Secondary | ICD-10-CM

## 2017-04-02 DIAGNOSIS — D508 Other iron deficiency anemias: Secondary | ICD-10-CM

## 2017-04-02 MED ORDER — IBUPROFEN 800 MG PO TABS: 800.0000 mg | ORAL_TABLET | Freq: Three times a day (TID) | ORAL | 5 refills | Status: DC | PRN

## 2017-04-02 MED ORDER — CARVEDILOL 12.5 MG PO TABS: 12.5000 mg | ORAL_TABLET | Freq: Two times a day (BID) | ORAL | 11 refills | Status: DC

## 2017-04-02 MED ORDER — TRIAMTERENE-HCTZ 37.5-25 MG PO CAPS: 1.0000 | ORAL_CAPSULE | Freq: Every day | ORAL | 11 refills | Status: DC

## 2017-04-02 MED ORDER — FERROUS SULFATE 325 (65 FE) MG PO TABS: 325.0000 mg | ORAL_TABLET | Freq: Two times a day (BID) | ORAL | 11 refills | Status: DC

## 2017-04-02 NOTE — Progress Notes (Signed)
NGYN patinet presents for Annual Exam today. B/P elevated pt aware was to f/u w/specialist has not been able to.   LMP;02/14/2017 Contraception: None  Last pap: Unsure per pt STD Screening: Full panel   CC: vaginal irritation constant per pt. Notices discharge no odor , itching no burning.   Pt request STD testing made aware [facility] will not cover cost and she would be billed.  Pt voiced understanding.

## 2017-04-02 NOTE — Progress Notes (Signed)
Subjective:        Shirley [name] is a 32 y.o. female here for a routine exam.  Current complaints: VAGINAL DISCHARGE AND IRRITATION.    Personal health questionnaire:  Is patient Ashkenazi Jewish, have a family history of breast and/or ovarian cancer: yes Is there a family history of uterine cancer diagnosed at age < 33, gastrointestinal cancer, urinary tract cancer, family member who is a Field seismologist syndrome-associated carrier: no Is the patient overweight and hypertensive, family history of diabetes, personal history of gestational diabetes, preeclampsia or PCOS: no Is patient over 32, have PCOS,  family history of premature CHD under age 35, diabetes, smoke, have hypertension or peripheral artery disease:  no At any time, has a partner hit, kicked or otherwise hurt or frightened you?: no Over the past 2 weeks, have you felt down, depressed or hopeless?: no Over the past 2 weeks, have you felt little interest or pleasure in doing things?:no   Gynecologic History Patient's last menstrual period was 02/14/2017 (exact date). Contraception: none Last Pap: 2016. Results were: normal Last mammogram: N/A. Results were: N/A  Obstetric History OB History  Gravida Para Term Preterm AB Living  10 3 1 2 3 4   SAB TAB Ectopic Multiple Live Births  1 2   1 1     # Outcome Date GA Lbr Len/2nd Weight Sex Delivery Anes PTL Lv  10 Gravida           9 SAB 2013             Birth Comments: System Generated. Please review and update pregnancy details.  8 TAB 2012          7 TAB 2011          6 Preterm 06/03/06 [redacted]w[redacted]d    CS-LTranv  Y      Birth Comments: twins  5 Term 07/01/03    M Vag-Spont  N LIV  4 Preterm 02/25/02 [redacted]w[redacted]d   M CS-LTranv        Birth Comments: NR NST  3 Gravida           2 Gravida           1 Gravida             Past Medical History:  Diagnosis Date  . Eczema   . Hypertension   . Low serum iron     Past Surgical History:  Procedure Laterality Date  . CESAREAN SECTION     . INDUCED ABORTION    . INDUCED ABORTION    . WISDOM TOOTH EXTRACTION       Current Outpatient Medications:  .  IRON PO, Take by mouth., Disp: , Rfl:  .  carvedilol (COREG) 12.5 MG tablet, Take 1 tablet (12.5 mg total) by mouth 2 (two) times daily with a meal., Disp: 60 tablet, Rfl: 11 .  dextromethorphan-guaiFENesin (MUCINEX DM) 30-600 MG 12hr tablet, Take 1 tablet by mouth 2 (two) times daily. (Patient not taking: Reported on 04/02/2017), Disp: 14 tablet, Rfl: 0 .  dicyclomine (BENTYL) 20 MG tablet, Take 1 tablet (20 mg total) by mouth 2 (two) times daily as needed for spasms (stomach cramping). (Patient not taking: Reported on 04/02/2017), Disp: 14 tablet, Rfl: 0 .  ferrous sulfate 325 (65 FE) MG tablet, Take 1 tablet (325 mg total) by mouth 2 (two) times daily at 8 am and 10 pm., Disp: 60 tablet, Rfl: 11 .  ibuprofen (ADVIL,MOTRIN) 600 MG tablet, Take 1 tablet (600  mg total) by mouth every 6 (six) hours as needed. (Patient not taking: Reported on 04/02/2017), Disp: 30 tablet, Rfl: 0 .  ibuprofen (ADVIL,MOTRIN) 800 MG tablet, Take 1 tablet (800 mg total) by mouth every 8 (eight) hours as needed., Disp: 30 tablet, Rfl: 5 .  triamterene-hydrochlorothiazide (DYAZIDE) 37.5-25 MG capsule, Take 1 each (1 capsule total) by mouth daily before breakfast., Disp: 30 capsule, Rfl: 11 No Known Allergies  Social History   Tobacco Use  . Smoking status: Current Every Day Smoker    Packs/day: 0.50    Years: 2.00    Pack years: 1.00    Types: Cigarettes  . Smokeless tobacco: Never Used  Substance Use Topics  . Alcohol use: No    Family History  Problem Relation Age of Onset  . Cancer Maternal Grandmother        breast  . Other Neg Hx   . Hearing loss Neg Hx       Review of Systems  Constitutional: negative for fatigue and weight loss Respiratory: negative for cough and wheezing Cardiovascular: negative for chest pain, fatigue and palpitations Gastrointestinal: negative for abdominal pain  and change in bowel habits Musculoskeletal:negative for myalgias Neurological: negative for gait problems and tremors Behavioral/Psych: negative for abusive relationship, depression Endocrine: negative for temperature intolerance    Genitourinary:negative for abnormal menstrual periods, genital lesions, hot flashes, sexual problems and vaginal discharge Integument/breast: negative for breast lump, breast tenderness, nipple discharge and skin lesion(s)    Objective:       BP (!) 151/99   Pulse 87   Ht 5\' 1"  (1.549 m)   Wt 156 lb (70.8 kg)   LMP 02/14/2017 (Exact Date)   BMI 29.48 kg/m  General:   alert  Skin:   no rash or abnormalities  Lungs:   clear to auscultation bilaterally  Heart:   regular rate and rhythm, S1, S2 normal, no murmur, click, rub or gallop  Breasts:   normal without suspicious masses, skin or nipple changes or axillary nodes  Abdomen:  normal findings: no organomegaly, soft, non-tender and no hernia  Pelvis:  External genitalia: normal general appearance Urinary system: urethral meatus normal and bladder without fullness, nontender Vaginal: normal without tenderness, induration or masses Cervix: normal appearance Adnexa: normal bimanual exam Uterus: anteverted and non-tender, normal size   Lab Review Urine pregnancy test Labs reviewed yes Radiologic studies reviewed no  50% of 20 min visit spent on counseling and coordination of care.   Assessment:     1. Encounter for annual routine gynecological examination   2. Screening for cervical cancer Rx: - Cytology - PAP  3. Screening examination for STD (sexually transmitted disease) Rx: - Cervicovaginal ancillary only  4. HTN (hypertension), benign Rx: - triamterene-hydrochlorothiazide (DYAZIDE) 37.5-25 MG capsule; Take 1 each (1 capsule total) by mouth daily before breakfast.  Dispense: 30 capsule; Refill: 11 - carvedilol (COREG) 12.5 MG tablet; Take 1 tablet (12.5 mg total) by mouth 2 (two) times  daily with a meal.  Dispense: 60 tablet; Refill: 11  5. Dysmenorrhea Rx: - ibuprofen (ADVIL,MOTRIN) 800 MG tablet; Take 1 tablet (800 mg total) by mouth every 8 (eight) hours as needed.  Dispense: 30 tablet; Refill: 5  6. Iron deficiency anemia secondary to inadequate dietary iron intake Rx: - ferrous sulfate 325 (65 FE) MG tablet; Take 1 tablet (325 mg total) by mouth 2 (two) times daily at 8 am and 10 pm.  Dispense: 60 tablet; Refill: 11  7. Tobacco dependence due  to cigarettes - smoking cessation program of medication and behavioral modification recommended     Plan:    Education reviewed: calcium supplements, depression evaluation, low fat, low cholesterol diet, safe sex/STD prevention, self breast exams, smoking cessation and weight bearing exercise. Contraception: none. Follow up in: 1 year.   Meds ordered this encounter  Medications  . ibuprofen (ADVIL,MOTRIN) 800 MG tablet    Sig: Take 1 tablet (800 mg total) by mouth every 8 (eight) hours as needed.    Dispense:  30 tablet    Refill:  5  . triamterene-hydrochlorothiazide (DYAZIDE) 37.5-25 MG capsule    Sig: Take 1 each (1 capsule total) by mouth daily before breakfast.    Dispense:  30 capsule    Refill:  11  . carvedilol (COREG) 12.5 MG tablet    Sig: Take 1 tablet (12.5 mg total) by mouth 2 (two) times daily with a meal.    Dispense:  60 tablet    Refill:  11  . ferrous sulfate 325 (65 FE) MG tablet    Sig: Take 1 tablet (325 mg total) by mouth 2 (two) times daily at 8 am and 10 pm.    Dispense:  60 tablet    Refill:  11   No orders of the defined types were placed in this encounter.   Kiyomi Pallo a. Jodi Mourning md 04-02-2017

## 2017-04-03 LAB — CYTOLOGY - PAP
Diagnosis: NEGATIVE
HPV: NOT DETECTED

## 2017-04-03 LAB — CERVICOVAGINAL ANCILLARY ONLY
Bacterial vaginitis: POSITIVE — AB
Candida vaginitis: POSITIVE — AB
Chlamydia: NEGATIVE
Neisseria Gonorrhea: NEGATIVE
Trichomonas: NEGATIVE

## 2017-04-04 ENCOUNTER — Other Ambulatory Visit: Payer: Self-pay | Admitting: Obstetrics

## 2017-04-04 DIAGNOSIS — B373 Candidiasis of vulva and vagina: Secondary | ICD-10-CM

## 2017-04-04 DIAGNOSIS — B9689 Other specified bacterial agents as the cause of diseases classified elsewhere: Secondary | ICD-10-CM

## 2017-04-04 DIAGNOSIS — B3731 Acute candidiasis of vulva and vagina: Secondary | ICD-10-CM

## 2017-04-04 DIAGNOSIS — N760 Acute vaginitis: Secondary | ICD-10-CM

## 2017-04-04 MED ORDER — FLUCONAZOLE 150 MG PO TABS: 150.0000 mg | ORAL_TABLET | Freq: Once | ORAL | 0 refills | Status: AC

## 2017-04-04 MED ORDER — SECNIDAZOLE 2 G PO PACK: 1.0000 | PACK | Freq: Once | ORAL | 2 refills | Status: AC

## 2017-04-06 ENCOUNTER — Other Ambulatory Visit: Payer: Self-pay

## 2017-04-06 DIAGNOSIS — B9689 Other specified bacterial agents as the cause of diseases classified elsewhere: Secondary | ICD-10-CM

## 2017-04-06 DIAGNOSIS — N760 Acute vaginitis: Principal | ICD-10-CM

## 2017-04-06 MED ORDER — METRONIDAZOLE 500 MG PO TABS: 500.0000 mg | ORAL_TABLET | Freq: Two times a day (BID) | ORAL | 0 refills | Status: AC

## 2017-09-29 ENCOUNTER — Emergency Department (HOSPITAL_BASED_OUTPATIENT_CLINIC_OR_DEPARTMENT_OTHER)
Admission: EM | Admit: 2017-09-29 | Discharge: 2017-09-29 | Disposition: A | Discharge status: Home / Self Care | Payer: Self-pay | Attending: Emergency Medicine | Admitting: Emergency Medicine

## 2017-09-29 ENCOUNTER — Encounter (HOSPITAL_BASED_OUTPATIENT_CLINIC_OR_DEPARTMENT_OTHER): Payer: Self-pay

## 2017-09-29 ENCOUNTER — Other Ambulatory Visit: Payer: Self-pay

## 2017-09-29 ENCOUNTER — Emergency Department (HOSPITAL_BASED_OUTPATIENT_CLINIC_OR_DEPARTMENT_OTHER): Payer: Self-pay

## 2017-09-29 DIAGNOSIS — I10 Essential (primary) hypertension: Secondary | ICD-10-CM | POA: Insufficient documentation

## 2017-09-29 DIAGNOSIS — F17210 Nicotine dependence, cigarettes, uncomplicated: Secondary | ICD-10-CM | POA: Insufficient documentation

## 2017-09-29 DIAGNOSIS — R1030 Lower abdominal pain, unspecified: Secondary | ICD-10-CM | POA: Insufficient documentation

## 2017-09-29 DIAGNOSIS — Z79899 Other long term (current) drug therapy: Secondary | ICD-10-CM | POA: Insufficient documentation

## 2017-09-29 DIAGNOSIS — D508 Other iron deficiency anemias: Secondary | ICD-10-CM

## 2017-09-29 DIAGNOSIS — R109 Unspecified abdominal pain: Secondary | ICD-10-CM

## 2017-09-29 DIAGNOSIS — Z113 Encounter for screening for infections with a predominantly sexual mode of transmission: Secondary | ICD-10-CM | POA: Insufficient documentation

## 2017-09-29 DIAGNOSIS — R110 Nausea: Secondary | ICD-10-CM | POA: Insufficient documentation

## 2017-09-29 LAB — COMPREHENSIVE METABOLIC PANEL
ALT: 16 U/L (ref 0–44)
AST: 20 U/L (ref 15–41)
Albumin: 4 g/dL (ref 3.5–5.0)
Alkaline Phosphatase: 74 U/L (ref 38–126)
Anion gap: 8 (ref 5–15)
BUN: 10 mg/dL (ref 6–20)
CO2: 23 mmol/L (ref 22–32)
Calcium: 8.6 mg/dL — ABNORMAL LOW (ref 8.9–10.3)
Chloride: 103 mmol/L (ref 98–111)
Creatinine, Ser: 0.57 mg/dL (ref 0.44–1.00)
GFR calc Af Amer: >60 mL/min (ref >60)
GFR calc non Af Amer: >60 mL/min (ref >60)
Glucose, Bld: 90 mg/dL (ref 70–99)
Potassium: 3.9 mmol/L (ref 3.5–5.1)
Sodium: 134 mmol/L — ABNORMAL LOW (ref 135–145)
Total Bilirubin: 0.3 mg/dL (ref 0.3–1.2)
Total Protein: 7.3 g/dL (ref 6.5–8.1)

## 2017-09-29 LAB — URINALYSIS, ROUTINE W REFLEX MICROSCOPIC
Bilirubin Urine: NEGATIVE
Glucose, UA: NEGATIVE mg/dL
Hgb urine dipstick: NEGATIVE
Ketones, ur: NEGATIVE mg/dL
Leukocytes, UA: NEGATIVE
Nitrite: NEGATIVE
Protein, ur: NEGATIVE mg/dL
Specific Gravity, Urine: 1.01 (ref 1.005–1.030)
pH: 6 (ref 5.0–8.0)

## 2017-09-29 LAB — LIPASE, BLOOD: Lipase: 40 U/L (ref 11–51)

## 2017-09-29 LAB — CBC
HCT: 27.6 % — ABNORMAL LOW (ref 36.0–46.0)
Hemoglobin: 8.4 g/dL — ABNORMAL LOW (ref 12.0–15.0)
MCH: 21.1 pg — ABNORMAL LOW (ref 26.0–34.0)
MCHC: 30.4 g/dL (ref 30.0–36.0)
MCV: 69.2 fL — ABNORMAL LOW (ref 78.0–100.0)
Platelets: 437 10*3/uL — ABNORMAL HIGH (ref 150–400)
RBC: 3.99 MIL/uL (ref 3.87–5.11)
RDW: 19.3 % — ABNORMAL HIGH (ref 11.5–15.5)
WBC: 5.4 10*3/uL (ref 4.0–10.5)

## 2017-09-29 LAB — PREGNANCY, URINE: Preg Test, Ur: NEGATIVE

## 2017-09-29 MED ORDER — FERROUS SULFATE 325 (65 FE) MG PO TABS: 325.0000 mg | ORAL_TABLET | Freq: Two times a day (BID) | ORAL | 11 refills | Status: DC

## 2017-09-29 MED ORDER — ACETAMINOPHEN 325 MG PO TABS
650.0000 mg | ORAL_TABLET | Freq: Once | ORAL | Status: AC
  Administered 2017-09-29: 650 mg via ORAL
  Filled 2017-09-29: qty 2

## 2017-09-29 NOTE — Discharge Instructions (Signed)
Take an Iron Pill everyday  Follow up with your ob/gyn if symptoms persist  Please return to the emergency department for new or worsening symptoms

## 2017-09-29 NOTE — ED Provider Notes (Signed)
[facility] EMERGENCY DEPARTMENT Provider Note   CSN: [id number] Arrival date & time: 09/29/17  3790     History   Chief Complaint Chief Complaint  Patient presents with  . Abdominal Pain    HPI Shirley [name] is a 32 y.o. female.  HPI  32 year old female presents the emergency department complaints of 2 weeks of generalized lower abdominal pain.  Denies fevers and chills.  Reports some nausea without vomiting.  Denies diarrhea.  No back pain or flank pain.  No vaginal complaints including lack of vaginal discharge.  No dysuria or urinary frequency.  She does feel like her bowel movements have been slightly more frequent and slightly softer over the past week.  No blood noted in her stool.  Currently not on her menstrual cycle.   Past Medical History:  Diagnosis Date  . Eczema   . Hypertension   . Low serum iron     There are no active problems to display for this patient.   Past Surgical History:  Procedure Laterality Date  . CESAREAN SECTION    . INDUCED ABORTION    . INDUCED ABORTION    . WISDOM TOOTH EXTRACTION       OB History    Gravida  10   Para  3   Term  1   Preterm  2   AB  3   Living  4     SAB  1   TAB  2   Ectopic      Multiple  1   Live Births  1            Home Medications    Prior to Admission medications   Medication Sig Start Date End Date Taking? Authorizing Provider  carvedilol (COREG) 12.5 MG tablet Take 1 tablet (12.5 mg total) by mouth 2 (two) times daily with a meal. 04/02/17   Shelly Bombard, MD  dextromethorphan-guaiFENesin [facility] DM) 30-600 MG 12hr tablet Take 1 tablet by mouth 2 (two) times daily. Patient not taking: Reported on 04/02/2017 12/10/16   Langston Masker B, PA-C  dicyclomine (BENTYL) 20 MG tablet Take 1 tablet (20 mg total) by mouth 2 (two) times daily as needed for spasms (stomach cramping). Patient not taking: Reported on 04/02/2017 02/27/17   Gareth Morgan, MD  ferrous sulfate 325  (65 FE) MG tablet Take 1 tablet (325 mg total) by mouth 2 (two) times daily at 8 am and 10 pm. 04/02/17   Shelly Bombard, MD  ibuprofen (ADVIL,MOTRIN) 600 MG tablet Take 1 tablet (600 mg total) by mouth every 6 (six) hours as needed. Patient not taking: Reported on 04/02/2017 12/10/16   Langston Masker B, PA-C  ibuprofen (ADVIL,MOTRIN) 800 MG tablet Take 1 tablet (800 mg total) by mouth every 8 (eight) hours as needed. 04/02/17   Shelly Bombard, MD  IRON PO Take by mouth.    [provider]  triamterene-hydrochlorothiazide (DYAZIDE) 37.5-25 MG capsule Take 1 each (1 capsule total) by mouth daily before breakfast. 04/02/17   Shelly Bombard, MD    Family History Family History  Problem Relation Age of Onset  . Cancer Maternal Grandmother        breast  . Other Neg Hx   . Hearing loss Neg Hx     Social History Social History   Tobacco Use  . Smoking status: Current Every Day Smoker    Packs/day: 0.50    Years: 2.00    Pack years:  1.00    Types: Cigarettes  . Smokeless tobacco: Never Used  Substance Use Topics  . Alcohol use: No  . Drug use: No     Allergies   Patient has no known allergies.   Review of Systems Review of Systems  All other systems reviewed and are negative.    Physical Exam Updated Vital Signs BP (!) 167/107 (BP Location: Left Arm)   Pulse 85   Temp 97.7 F (36.5 C) (Oral)   Resp 16   Ht 5\' 1"  (1.549 m)   Wt 72.6 kg   LMP 09/16/2017   SpO2 100%   BMI 30.23 kg/m   Physical Exam  Constitutional: She is oriented to person, place, and time. She appears well-developed and well-nourished. No distress.  HENT:  Head: Normocephalic and atraumatic.  Eyes: EOM are normal.  Neck: Normal range of motion.  Cardiovascular: Normal rate, regular rhythm and normal heart sounds.  Pulmonary/Chest: Effort normal and breath sounds normal.  Abdominal: Soft. She exhibits no distension. There is no tenderness.  Musculoskeletal: Normal range of motion.   Neurological: She is alert and oriented to person, place, and time.  Skin: Skin is warm and dry.  Psychiatric: She has a normal mood and affect. Judgment normal.  Nursing note and vitals reviewed.    ED Treatments / Results  Labs (all labs ordered are listed, but only abnormal results are displayed) Labs Reviewed  CBC - Abnormal; Notable for the following components:      Result Value   Hemoglobin 8.4 (*)    HCT 27.6 (*)    MCV 69.2 (*)    MCH 21.1 (*)    RDW 19.3 (*)    Platelets 437 (*)    All other components within normal limits  COMPREHENSIVE METABOLIC PANEL - Abnormal; Notable for the following components:   Sodium 134 (*)    Calcium 8.6 (*)    All other components within normal limits  URINALYSIS, ROUTINE W REFLEX MICROSCOPIC  PREGNANCY, URINE  LIPASE, BLOOD    EKG None  Radiology Dg Abd 2 Views  Result Date: 09/29/2017 CLINICAL DATA:  Lower abdominal pain and tenderness. EXAM: ABDOMEN - 2 VIEW COMPARISON:  Radiographs dated 12/22/2012 FINDINGS: The bowel gas pattern is normal. There is no evidence of free air. No radio-opaque calculi or other significant radiographic abnormality is seen. IMPRESSION: Benign-appearing abdomen. Electronically Signed   By: Lorriane Shire M.D.   On: 09/29/2017 09:59    Procedures Procedures (including critical care time)  Medications Ordered in ED Medications  acetaminophen (TYLENOL) tablet 650 mg (650 mg Oral Given 09/29/17 0905)     Initial Impression / Assessment and Plan / ED Course  I have reviewed the triage vital signs and the nursing notes.  Pertinent labs & imaging results that were available during my care of the patient were reviewed by me and considered in my medical decision making (see chart for details).     No significant abdominal tenderness.  Patient has had some anemia before in the past.  She will need to take daily iron supplements.  Urine and urine pregnancy are without abnormality.  Repeat abdominal  exam without focal tenderness.  No indication for advanced imaging.  Primary care follow-up.  Patient encouraged to return to the ER for new or worsening symptoms.  Nonspecific abdominal pain.  Final Clinical Impressions(s) / ED Diagnoses   Final diagnoses:  Lower abdominal pain, unspecified  Acute abdominal pain    ED Discharge Orders  None       Jola Schmidt, MD 09/29/17 1034

## 2017-09-29 NOTE — ED Triage Notes (Signed)
Pt c/o rt to center abdominal pain x2wks

## 2017-09-29 NOTE — ED Notes (Signed)
Patient wants to be tested for STD.

## 2017-10-06 ENCOUNTER — Ambulatory Visit: Payer: Medicaid Other | Admitting: Obstetrics

## 2017-10-15 ENCOUNTER — Ambulatory Visit: Payer: Self-pay | Admitting: Obstetrics

## 2018-01-04 ENCOUNTER — Encounter: Payer: Self-pay | Admitting: Advanced Practice Midwife

## 2018-01-12 ENCOUNTER — Ambulatory Visit (INDEPENDENT_AMBULATORY_CARE_PROVIDER_SITE_OTHER): Payer: Medicaid Other

## 2018-01-12 VITALS — BP 146/93 | HR 93 | Resp 18 | Ht 60.0 in | Wt 161.8 lb

## 2018-01-12 DIAGNOSIS — M545 Low back pain, unspecified: Secondary | ICD-10-CM

## 2018-01-12 DIAGNOSIS — R3589 Other polyuria: Secondary | ICD-10-CM

## 2018-01-12 DIAGNOSIS — Z3200 Encounter for pregnancy test, result unknown: Secondary | ICD-10-CM

## 2018-01-12 DIAGNOSIS — R358 Other polyuria: Secondary | ICD-10-CM

## 2018-01-12 DIAGNOSIS — R1030 Lower abdominal pain, unspecified: Secondary | ICD-10-CM

## 2018-01-12 DIAGNOSIS — Z3201 Encounter for pregnancy test, result positive: Secondary | ICD-10-CM

## 2018-01-12 DIAGNOSIS — R350 Frequency of micturition: Secondary | ICD-10-CM

## 2018-01-12 LAB — POCT URINALYSIS DIP (MANUAL ENTRY)
Bilirubin, UA: NEGATIVE
Blood, UA: NEGATIVE
Glucose, UA: NEGATIVE mg/dL
Ketones, POC UA: NEGATIVE mg/dL
Nitrite, UA: NEGATIVE
Protein Ur, POC: NEGATIVE mg/dL
Spec Grav, UA: 1.02 (ref 1.010–1.025)
Urobilinogen, UA: 0.2 E.U./dL
pH, UA: 6.5 (ref 5.0–8.0)

## 2018-01-12 LAB — POCT URINE PREGNANCY: Preg Test, Ur: POSITIVE — AB

## 2018-01-12 NOTE — Progress Notes (Signed)
Shirley [name] presents today for UPT; polyuria; bilateral lower back pain; lower abdomen pain; nausea for the last 2-3 weeks.  LMP: 11/19/2017 Approximately  EDD: 08/26/2018  7w 5d    OBJECTIVE: Appears well, in no apparent distress.  OB History    Gravida  10   Para  3   Term  1   Preterm  2   AB  3   Living  4     SAB  1   TAB  2   Ectopic      Multiple  1   Live Births  1          Home UPT Result: Patient reports she has not done a home pregnancy yet In-Office UPT result: Positive POCT Urine dipstick: Trace WBCs  I have reviewed the patient's medical, obstetrical, social, and family histories, and medications.   ASSESSMENT: Positive pregnancy test; Abnormal urine dipstick  Plan: Patient will receive care: [facility]. Patient advised to begin Prenatal vitamins today. Patient advised to schedule Initial OB visit between 10-12 weeks. Patient advised if she begins to experience Acute abdominal pain or bleeding, shortness of breath to go to [facility] for evaluation and treatment. Urine Culture OB will be sent to lab. Once lab results have been received and reviewed by provider, patient will be contacted by our office.

## 2018-01-13 NOTE — Progress Notes (Signed)
Agree with A & P. 

## 2018-01-17 ENCOUNTER — Other Ambulatory Visit: Payer: Self-pay | Admitting: Obstetrics

## 2018-01-17 DIAGNOSIS — N390 Urinary tract infection, site not specified: Secondary | ICD-10-CM

## 2018-01-17 LAB — URINE CULTURE, OB REFLEX

## 2018-01-17 LAB — CULTURE, OB URINE

## 2018-01-17 MED ORDER — NITROFURANTOIN MONOHYD MACRO 100 MG PO CAPS: 100.0000 mg | ORAL_CAPSULE | Freq: Two times a day (BID) | ORAL | 0 refills | Status: DC

## 2018-01-18 ENCOUNTER — Other Ambulatory Visit: Payer: Self-pay

## 2018-01-18 DIAGNOSIS — N390 Urinary tract infection, site not specified: Secondary | ICD-10-CM

## 2018-01-18 MED ORDER — SULFAMETHOXAZOLE-TRIMETHOPRIM 800-160 MG PO TABS: 1.0000 | ORAL_TABLET | Freq: Two times a day (BID) | ORAL | 1 refills | Status: DC

## 2018-02-22 ENCOUNTER — Other Ambulatory Visit: Payer: Self-pay

## 2018-02-22 ENCOUNTER — Emergency Department (HOSPITAL_BASED_OUTPATIENT_CLINIC_OR_DEPARTMENT_OTHER)
Admission: EM | Admit: 2018-02-22 | Discharge: 2018-02-22 | Disposition: A | Discharge status: Home / Self Care | Payer: Medicaid Other | Attending: Emergency Medicine | Admitting: Emergency Medicine

## 2018-02-22 ENCOUNTER — Encounter (HOSPITAL_BASED_OUTPATIENT_CLINIC_OR_DEPARTMENT_OTHER): Payer: Self-pay | Admitting: *Deleted

## 2018-02-22 DIAGNOSIS — Z3A13 13 weeks gestation of pregnancy: Secondary | ICD-10-CM | POA: Insufficient documentation

## 2018-02-22 DIAGNOSIS — R109 Unspecified abdominal pain: Secondary | ICD-10-CM

## 2018-02-22 DIAGNOSIS — Z87891 Personal history of nicotine dependence: Secondary | ICD-10-CM | POA: Insufficient documentation

## 2018-02-22 DIAGNOSIS — R1030 Lower abdominal pain, unspecified: Secondary | ICD-10-CM | POA: Insufficient documentation

## 2018-02-22 DIAGNOSIS — O10913 Unspecified pre-existing hypertension complicating pregnancy, third trimester: Secondary | ICD-10-CM | POA: Insufficient documentation

## 2018-02-22 DIAGNOSIS — O26899 Other specified pregnancy related conditions, unspecified trimester: Secondary | ICD-10-CM

## 2018-02-22 DIAGNOSIS — O9989 Other specified diseases and conditions complicating pregnancy, childbirth and the puerperium: Secondary | ICD-10-CM | POA: Insufficient documentation

## 2018-02-22 LAB — URINALYSIS, ROUTINE W REFLEX MICROSCOPIC
Bilirubin Urine: NEGATIVE
Glucose, UA: NEGATIVE mg/dL
Hgb urine dipstick: NEGATIVE
Ketones, ur: NEGATIVE mg/dL
Leukocytes,Ua: NEGATIVE
Nitrite: NEGATIVE
Protein, ur: NEGATIVE mg/dL
Specific Gravity, Urine: 1.02 (ref 1.005–1.030)
pH: 6.5 (ref 5.0–8.0)

## 2018-02-22 NOTE — ED Notes (Signed)
Fetal heart rate : 153

## 2018-02-22 NOTE — ED Provider Notes (Signed)
[facility] EMERGENCY DEPARTMENT Provider Note   CSN: [id number] Arrival date & time: 02/22/18  1405    History   Chief Complaint Chief Complaint  Patient presents with  . Abdominal Pain    HPI Shirley [name] is a 33 y.o. female.     HPI Reports that she is pregnant.  At her first OB appointment where pregnancy was identified but does not have a follow-up appointment scheduled yet.  She was supposed to have been scheduled for 10 to 12 weeks.  Patient estimates her last menstrual period was mid November 20 patient had approximately 13 weeks by dates.  She sees Dr. Jodi Mourning at [place].  She has had her medications adjusted as to what she is allowed to take and started on prenatal vitamins.  She reports she is had abdominal pain ever since her pregnancy.  She describes as being generalized and moves her hands around all of the upper and lateral abdomen.  It is daily.  He denies she is getting vomiting.  She reports is cramping in nature.  She has not had any vaginal bleeding or discharge.  No fevers no chills.  Patient reports she has had several prior miscarriages.  She reports she also has 4 living children youngest of which is 82 years old.  Reports that this was not a planned pregnancy. Past Medical History:  Diagnosis Date  . Eczema   . Hypertension   . Low serum iron     There are no active problems to display for this patient.   Past Surgical History:  Procedure Laterality Date  . CESAREAN SECTION    . INDUCED ABORTION    . INDUCED ABORTION    . WISDOM TOOTH EXTRACTION       OB History    Gravida  11   Para  3   Term  1   Preterm  2   AB  3   Living  4     SAB  1   TAB  2   Ectopic      Multiple  1   Live Births  1            Home Medications    Prior to Admission medications   Medication Sig Start Date End Date Taking? Authorizing Provider  ibuprofen (ADVIL,MOTRIN) 800 MG tablet Take 1 tablet (800 mg total) by mouth every 8  (eight) hours as needed. 04/02/17   Shelly Bombard, MD    Family History Family History  Problem Relation Age of Onset  . Cancer Maternal Grandmother        breast  . Other Neg Hx   . Hearing loss Neg Hx     Social History Social History   Tobacco Use  . Smoking status: Former Smoker    Packs/day: 0.50    Years: 2.00    Pack years: 1.00    Types: Cigarettes  . Smokeless tobacco: Never Used  Substance Use Topics  . Alcohol use: No  . Drug use: No     Allergies   Patient has no known allergies.   Review of Systems Review of Systems 10 Systems reviewed and are negative for acute change except as noted in the HPI.   Physical Exam Updated Vital Signs BP (!) 159/97   Pulse 87   Temp 98.1 F (36.7 C) (Oral)   Resp 16   Ht 5\' 4"  (1.626 m)   Wt 72.6 kg   LMP 11/24/2017  SpO2 100%   BMI 27.46 kg/m   Physical Exam Constitutional:      Appearance: Normal appearance.     Comments: No appearance of acute distress.  Clinically well in appearance.  Well-nourished well-developed.  HENT:     Mouth/Throat:     Mouth: Mucous membranes are moist.  Eyes:     Extraocular Movements: Extraocular movements intact.     Conjunctiva/sclera: Conjunctivae normal.  Cardiovascular:     Rate and Rhythm: Normal rate and regular rhythm.  Pulmonary:     Effort: Pulmonary effort is normal.     Breath sounds: Normal breath sounds.  Abdominal:     Comments: But is soft.  No localizing tenderness.  No guarding.  No significant lower abdominal tenderness.  Musculoskeletal: Normal range of motion.        General: No swelling or tenderness.  Skin:    General: Skin is warm and dry.  Neurological:     General: No focal deficit present.     Mental Status: She is alert and oriented to person, place, and time.     Coordination: Coordination normal.  Psychiatric:        Mood and Affect: Mood normal.      ED Treatments / Results  Labs (all labs ordered are listed, but only  abnormal results are displayed) Labs Reviewed  URINALYSIS, ROUTINE W REFLEX MICROSCOPIC    EKG None  Radiology No results found.  Procedures Procedures (including critical care time) Bedside ultrasound used to confirm IUP.  Transabdominal probe easily visualized fully formed fetus with active movement and positive heartbeat Medications Ordered in ED Medications - No data to display   Initial Impression / Assessment and Plan / ED Course  I have reviewed the triage vital signs and the nursing notes.  Pertinent labs & imaging results that were available during my care of the patient were reviewed by me and considered in my medical decision making (see chart for details).      Patient presents complaining of generalized abdominal pain in pregnancy.  This is been going on she reports since she was first pregnant.  No signs of other surgical etiology.  This is predominantly diffusely cramping in her upper abdomen.  Not having significant amount of vomiting with early pregnancy.  Patient is well-hydrated in appearance.  Urinalysis is normal, active fetus confirmed on ultrasound.  At this time, patient is stable to follow-up with her OB/GYN for ongoing prenatal and pregnancy care.  She has started prenatal vitamins.  Final Clinical Impressions(s) / ED Diagnoses   Final diagnoses:  Abdominal pain affecting pregnancy    ED Discharge Orders    None       Charlesetta Shanks, MD 02/22/18 1601

## 2018-02-22 NOTE — Discharge Instructions (Signed)
Follow-up with your [facility] doctor as soon as possible.

## 2018-02-22 NOTE — ED Triage Notes (Signed)
Pt c/o lower abd pain x 1 month pt states 10 weeks preg

## 2018-06-03 ENCOUNTER — Emergency Department (HOSPITAL_COMMUNITY)
Admission: EM | Admit: 2018-06-03 | Discharge: 2018-06-03 | Disposition: A | Discharge status: Home / Self Care | Payer: Medicaid Other | Attending: Emergency Medicine | Admitting: Emergency Medicine

## 2018-06-03 ENCOUNTER — Encounter (HOSPITAL_COMMUNITY): Payer: Self-pay | Admitting: Emergency Medicine

## 2018-06-03 DIAGNOSIS — Y999 Unspecified external cause status: Secondary | ICD-10-CM | POA: Insufficient documentation

## 2018-06-03 DIAGNOSIS — S161XXA Strain of muscle, fascia and tendon at neck level, initial encounter: Secondary | ICD-10-CM | POA: Insufficient documentation

## 2018-06-03 DIAGNOSIS — S5011XA Contusion of right forearm, initial encounter: Secondary | ICD-10-CM | POA: Insufficient documentation

## 2018-06-03 DIAGNOSIS — Y92410 Unspecified street and highway as the place of occurrence of the external cause: Secondary | ICD-10-CM | POA: Insufficient documentation

## 2018-06-03 DIAGNOSIS — Y9389 Activity, other specified: Secondary | ICD-10-CM | POA: Insufficient documentation

## 2018-06-03 DIAGNOSIS — T148XXA Other injury of unspecified body region, initial encounter: Secondary | ICD-10-CM

## 2018-06-03 DIAGNOSIS — I10 Essential (primary) hypertension: Secondary | ICD-10-CM | POA: Insufficient documentation

## 2018-06-03 DIAGNOSIS — Z87891 Personal history of nicotine dependence: Secondary | ICD-10-CM | POA: Insufficient documentation
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
## 2018-08-09 ENCOUNTER — Emergency Department (HOSPITAL_BASED_OUTPATIENT_CLINIC_OR_DEPARTMENT_OTHER): Payer: Medicaid Other

## 2018-08-09 ENCOUNTER — Other Ambulatory Visit: Payer: Self-pay

## 2018-08-09 ENCOUNTER — Encounter (HOSPITAL_BASED_OUTPATIENT_CLINIC_OR_DEPARTMENT_OTHER): Payer: Self-pay

## 2018-08-09 ENCOUNTER — Emergency Department (HOSPITAL_BASED_OUTPATIENT_CLINIC_OR_DEPARTMENT_OTHER)
Admission: EM | Admit: 2018-08-09 | Discharge: 2018-08-09 | Disposition: A | Discharge status: Home / Self Care | Payer: Medicaid Other | Attending: Emergency Medicine | Admitting: Emergency Medicine

## 2018-08-09 DIAGNOSIS — O26891 Other specified pregnancy related conditions, first trimester: Secondary | ICD-10-CM | POA: Insufficient documentation

## 2018-08-09 DIAGNOSIS — R1030 Lower abdominal pain, unspecified: Secondary | ICD-10-CM | POA: Insufficient documentation

## 2018-08-09 DIAGNOSIS — O200 Threatened abortion: Secondary | ICD-10-CM | POA: Insufficient documentation

## 2018-08-09 DIAGNOSIS — Z3A09 9 weeks gestation of pregnancy: Secondary | ICD-10-CM | POA: Insufficient documentation

## 2018-08-09 DIAGNOSIS — Z87891 Personal history of nicotine dependence: Secondary | ICD-10-CM | POA: Insufficient documentation

## 2018-08-09 DIAGNOSIS — I10 Essential (primary) hypertension: Secondary | ICD-10-CM | POA: Insufficient documentation

## 2018-08-09 DIAGNOSIS — R102 Pelvic and perineal pain: Secondary | ICD-10-CM | POA: Insufficient documentation

## 2018-08-09 DIAGNOSIS — Z3491 Encounter for supervision of normal pregnancy, unspecified, first trimester: Secondary | ICD-10-CM | POA: Insufficient documentation

## 2018-08-09 DIAGNOSIS — Z3490 Encounter for supervision of normal pregnancy, unspecified, unspecified trimester: Secondary | ICD-10-CM

## 2018-08-09 LAB — PREGNANCY, URINE: Preg Test, Ur: POSITIVE — AB

## 2018-08-09 LAB — URINALYSIS, ROUTINE W REFLEX MICROSCOPIC
Bilirubin Urine: NEGATIVE
Glucose, UA: NEGATIVE mg/dL
Hgb urine dipstick: NEGATIVE
Ketones, ur: NEGATIVE mg/dL
Nitrite: NEGATIVE
Protein, ur: NEGATIVE mg/dL
Specific Gravity, Urine: 1.015 (ref 1.005–1.030)
pH: 7.5 (ref 5.0–8.0)

## 2018-08-09 LAB — URINALYSIS, MICROSCOPIC (REFLEX)

## 2018-08-09 LAB — ABO/RH: ABO/RH(D): O POS

## 2018-08-09 LAB — HCG, QUANTITATIVE, PREGNANCY: hCG, Beta Chain, Quant, S: 131063 m[IU]/mL — ABNORMAL HIGH (ref <5)

## 2018-08-09 NOTE — ED Notes (Signed)
Pt awaiting test results. 

## 2018-08-09 NOTE — ED Notes (Signed)
Pt transported to u/s.  

## 2018-08-09 NOTE — Discharge Instructions (Addendum)
It was our pleasure to provide your ER care today - we hope that you feel better.  The ultrasound shows an 8 to 9 week pregnancy.   Rest. Drink plenty of fluids. No intercourse or vigorous physical activity until cleared to do so by your ob/gyn.   Follow up with your ob/gyn doctor in the coming week - call office to arrange appointment.  Return to [facility] if worse, vaginal bleeding, severe pelvic pain, or other concern.

## 2018-08-09 NOTE — ED Triage Notes (Signed)
Pt c/o vaginal d/c x 1+ weeks-states she had pos home preg test in July with intermittent vaginal bleeding-pt states she feels weak x 1 week and states she felt like she was going to pass out at work today just [employer] gait

## 2018-08-09 NOTE — ED Provider Notes (Signed)
[facility] EMERGENCY DEPARTMENT Provider Note   CSN: [id number] Arrival date & time: 08/09/18  1135     History   Chief Complaint Chief Complaint  Patient presents with   Vaginal Discharge    HPI Shirley [name] is a 33 y.o. female.     Patient c/o bil pelvic cramping pain for past 1-2 days. Symptoms acute onset, mild, persistent, non radiating. States lnmp was first week in June. When period didn't come she took pregnancy test after the first week in July and it was positive. States 2 weeks ago had heavy vaginal bleeding for 2-3 days the resembled period bleeding, and thought she may have miscarried. Bleeding resolved, but now notes lower abd/pelvic cramping in past couple days. Also notes mild vaginal discharge for past couple days. No dysuria or urgency. No back or flank pain. No fever or chills. No faintness or dizziness.   The history is provided by the patient.  Vaginal Discharge Associated symptoms: no dysuria, no fever and no vomiting     Past Medical History:  Diagnosis Date   Eczema    Hypertension    Low serum iron     There are no active problems to display for this patient.   Past Surgical History:  Procedure Laterality Date   CESAREAN SECTION     INDUCED ABORTION     INDUCED ABORTION     WISDOM TOOTH EXTRACTION       OB History    Gravida  11   Para  3   Term  1   Preterm  2   AB  3   Living  4     SAB  1   TAB  2   Ectopic      Multiple  1   Live Births  1            Home Medications    Prior to Admission medications   Medication Sig Start Date End Date Taking? Authorizing Provider  ibuprofen (ADVIL,MOTRIN) 800 MG tablet Take 1 tablet (800 mg total) by mouth every 8 (eight) hours as needed. 04/02/17   Shelly Bombard, MD    Family History Family History  Problem Relation Age of Onset   Cancer Maternal Grandmother        breast   Other Neg Hx    Hearing loss Neg Hx     Social History Social  History   Tobacco Use   Smoking status: Former Smoker    Packs/day: 0.50    Years: 2.00    Pack years: 1.00    Types: Cigarettes   Smokeless tobacco: Never Used  Substance Use Topics   Alcohol use: No   Drug use: No     Allergies   Patient has no known allergies.   Review of Systems Review of Systems  Constitutional: Negative for fever.  HENT: Negative for sore throat.   Eyes: Negative for redness.  Respiratory: Negative for cough and shortness of breath.   Cardiovascular: Negative for chest pain and leg swelling.  Gastrointestinal: Negative for diarrhea and vomiting.  Endocrine: Negative for polyuria.  Genitourinary: Positive for pelvic pain and vaginal discharge. Negative for dysuria, flank pain and vaginal bleeding.  Musculoskeletal: Negative for back pain and neck pain.  Skin: Negative for rash.  Neurological: Negative for light-headedness.  Hematological: Does not bruise/bleed easily.  Psychiatric/Behavioral: Negative for confusion.     Physical Exam Updated Vital Signs BP (!) 131/97 (BP Location: Left Arm)  Pulse 93    Temp 98.3 F (36.8 C) (Oral)    Resp 16    Ht 1.549 m (5\' 1" )    Wt 77.6 kg    LMP 06/09/2017    SpO2 100%    Breastfeeding Unknown    BMI 32.31 kg/m   Physical Exam Vitals signs and nursing note reviewed.  Constitutional:      Appearance: Normal appearance. She is well-developed.  HENT:     Head: Atraumatic.     Nose: Nose normal.     Mouth/Throat:     Mouth: Mucous membranes are moist.  Eyes:     General: No scleral icterus.    Conjunctiva/sclera: Conjunctivae normal.  Neck:     Musculoskeletal: Normal range of motion and neck supple. No neck rigidity or muscular tenderness.     Trachea: No tracheal deviation.  Cardiovascular:     Rate and Rhythm: Normal rate and regular rhythm.     Pulses: Normal pulses.     Heart sounds: Normal heart sounds. No murmur. No friction rub. No gallop.   Pulmonary:     Effort: Pulmonary effort is  normal. No respiratory distress.     Breath sounds: Normal breath sounds.  Abdominal:     General: Bowel sounds are normal. There is no distension.     Palpations: Abdomen is soft. There is no mass.     Tenderness: There is no abdominal tenderness. There is no guarding or rebound.     Hernia: No hernia is present.  Genitourinary:    Comments: No cva tenderness.  Musculoskeletal:        General: No swelling.  Skin:    General: Skin is warm and dry.     Findings: No rash.  Neurological:     Mental Status: She is alert.     Comments: Alert, speech normal.   Psychiatric:        Mood and Affect: Mood normal.      ED Treatments / Results  Labs (all labs ordered are listed, but only abnormal results are displayed) Results for orders placed or performed during the hospital encounter of 08/09/18  Urinalysis, Routine w reflex microscopic  Result Value Ref Range   Color, Urine YELLOW YELLOW   APPearance CLEAR CLEAR   Specific Gravity, Urine 1.015 1.005 - 1.030   pH 7.5 5.0 - 8.0   Glucose, UA NEGATIVE NEGATIVE mg/dL   Hgb urine dipstick NEGATIVE NEGATIVE   Bilirubin Urine NEGATIVE NEGATIVE   Ketones, ur NEGATIVE NEGATIVE mg/dL   Protein, ur NEGATIVE NEGATIVE mg/dL   Nitrite NEGATIVE NEGATIVE   Leukocytes,Ua SMALL (A) NEGATIVE  Pregnancy, urine  Result Value Ref Range   Preg Test, Ur POSITIVE (A) NEGATIVE  Urinalysis, Microscopic (reflex)  Result Value Ref Range   RBC / HPF 0-5 0 - 5 RBC/hpf   WBC, UA 0-5 0 - 5 WBC/hpf   Bacteria, UA RARE (A) NONE SEEN   Squamous Epithelial / LPF 11-20 0 - 5   Mucus PRESENT    Granular Casts, UA PRESENT   hCG, quantitative, pregnancy  Result Value Ref Range   hCG, Beta Chain, Quant, S 131,063 (H) <5 mIU/mL  ABO/Rh  Result Value Ref Range   ABO/RH(D) O POS    No rh immune globuloin      NOT A RH IMMUNE GLOBULIN CANDIDATE, PT RH POSITIVE Performed at [facility], [address], [place], [place] [address]      EKG None  Radiology  US Ob Comp < 14 Wks  Result Date: 08/09/2018 CLINICAL DATA:  Positive pregnancy test and bleeding EXAM: OBSTETRIC <14 WK [place] AND TRANSVAGINAL OB US TECHNIQUE: Both transabdominal and transvaginal ultrasound examinations were performed for complete evaluation of the gestation as well as the maternal uterus, adnexal regions, and pelvic cul-de-sac. Transvaginal technique was performed to assess early pregnancy. COMPARISON:  None. FINDINGS: Intrauterine gestational sac: Single Yolk sac:  Visualized. Embryo:  Visualized. Cardiac Activity: Visualized. Heart Rate: 175 bpm MSD: 38.7 mm   9 w   2 d CRL:  20.6 mm   8 w   5 d                  [place] EDC: 03/16/2019 Subchorionic hemorrhage:  A large subchorionic hemorrhage is seen. Maternal uterus/adnexae: The right ovary is nonvisualized. Within the left ovary there is question of 2 corpus luteum cyst. Small amount of fluid seen within the cervix. IMPRESSION: 1. Single live intrauterine pregnancy with estimated date of delivery of 03/16/2019. 2. Large subchorionic hemorrhage. 3. Small amount of fluid in the cervix. Electronically Signed   By: Prudencio Pair M.D.   On: 08/09/2018 14:00   US Ob Transvaginal  Result Date: 08/09/2018 CLINICAL DATA:  Positive pregnancy test and bleeding EXAM: OBSTETRIC <14 WK [place] AND TRANSVAGINAL OB US TECHNIQUE: Both transabdominal and transvaginal ultrasound examinations were performed for complete evaluation of the gestation as well as the maternal uterus, adnexal regions, and pelvic cul-de-sac. Transvaginal technique was performed to assess early pregnancy. COMPARISON:  None. FINDINGS: Intrauterine gestational sac: Single Yolk sac:  Visualized. Embryo:  Visualized. Cardiac Activity: Visualized. Heart Rate: 175 bpm MSD: 38.7 mm   9 w   2 d CRL:  20.6 mm   8 w   5 d                  [place] EDC: 03/16/2019 Subchorionic hemorrhage:  A large subchorionic hemorrhage is seen. Maternal uterus/adnexae: The right ovary is  nonvisualized. Within the left ovary there is question of 2 corpus luteum cyst. Small amount of fluid seen within the cervix. IMPRESSION: 1. Single live intrauterine pregnancy with estimated date of delivery of 03/16/2019. 2. Large subchorionic hemorrhage. 3. Small amount of fluid in the cervix. Electronically Signed   By: Prudencio Pair M.D.   On: 08/09/2018 14:00    Procedures Procedures (including critical care time)  Medications Ordered in ED Medications - No data to display   Initial Impression / Assessment and Plan / ED Course  I have reviewed the triage vital signs and the nursing notes.  Pertinent labs & imaging results that were available during my care of the patient were reviewed by me and considered in my medical decision making (see chart for details).  Labs sent.   Reviewed nursing notes and prior charts for additional history.   Labs reviewed by me - urine preg positive. Quant bhcg sent.   U/s ordered.   Additional labs reviewed by me - o pos.   U/s reviewed by me -  8 to 9 week IUP  Pelvic exam is umremarkable, no discharge or bleeding. Cervix closed. No cmt or adx masses/tenderness.   Patient indicates she has ob/gyn with whom she can/will follow up.  Return precautions provided.     Final Clinical Impressions(s) / ED Diagnoses   Final diagnoses:  None    ED Discharge Orders    None       Lajean Saver, MD 08/09/18 1546

## 2018-08-20 ENCOUNTER — Other Ambulatory Visit: Payer: Self-pay

## 2018-08-20 ENCOUNTER — Encounter (HOSPITAL_COMMUNITY): Payer: Self-pay | Admitting: *Deleted

## 2018-08-20 ENCOUNTER — Inpatient Hospital Stay (HOSPITAL_COMMUNITY)
Admission: AD | Admit: 2018-08-20 | Discharge: 2018-08-20 | Disposition: A | Discharge status: Home / Self Care | Payer: Self-pay | Attending: Obstetrics & Gynecology | Admitting: Obstetrics & Gynecology

## 2018-08-20 ENCOUNTER — Inpatient Hospital Stay (HOSPITAL_COMMUNITY): Payer: Self-pay

## 2018-08-20 DIAGNOSIS — O039 Complete or unspecified spontaneous abortion without complication: Secondary | ICD-10-CM

## 2018-08-20 DIAGNOSIS — B9689 Other specified bacterial agents as the cause of diseases classified elsewhere: Secondary | ICD-10-CM

## 2018-08-20 DIAGNOSIS — D649 Anemia, unspecified: Secondary | ICD-10-CM

## 2018-08-20 DIAGNOSIS — Z87891 Personal history of nicotine dependence: Secondary | ICD-10-CM | POA: Insufficient documentation

## 2018-08-20 DIAGNOSIS — O209 Hemorrhage in early pregnancy, unspecified: Secondary | ICD-10-CM

## 2018-08-20 DIAGNOSIS — O035 Genital tract and pelvic infection following complete or unspecified spontaneous abortion: Secondary | ICD-10-CM | POA: Insufficient documentation

## 2018-08-20 DIAGNOSIS — N760 Acute vaginitis: Secondary | ICD-10-CM

## 2018-08-20 DIAGNOSIS — Z3A10 10 weeks gestation of pregnancy: Secondary | ICD-10-CM | POA: Insufficient documentation

## 2018-08-20 HISTORY — DX: Unspecified ovarian cyst, unspecified side: N83.209

## 2018-08-20 HISTORY — DX: Unspecified infectious disease: B99.9

## 2018-08-20 LAB — CBC WITH DIFFERENTIAL/PLATELET
Abs Immature Granulocytes: 0.01 10*3/uL (ref 0.00–0.07)
Basophils Absolute: 0 10*3/uL (ref 0.0–0.1)
Basophils Relative: 1 %
Eosinophils Absolute: 0.2 10*3/uL (ref 0.0–0.5)
Eosinophils Relative: 3 %
HCT: 23.8 % — ABNORMAL LOW (ref 36.0–46.0)
Hemoglobin: 7.3 g/dL — ABNORMAL LOW (ref 12.0–15.0)
Immature Granulocytes: 0 %
Lymphocytes Relative: 23 %
Lymphs Abs: 1.4 10*3/uL (ref 0.7–4.0)
MCH: 24.1 pg — ABNORMAL LOW (ref 26.0–34.0)
MCHC: 30.7 g/dL (ref 30.0–36.0)
MCV: 78.5 fL — ABNORMAL LOW (ref 80.0–100.0)
Monocytes Absolute: 0.5 10*3/uL (ref 0.1–1.0)
Monocytes Relative: 8 %
Neutro Abs: 3.9 10*3/uL (ref 1.7–7.7)
Neutrophils Relative %: 65 %
Platelets: 369 10*3/uL (ref 150–400)
RBC: 3.03 MIL/uL — ABNORMAL LOW (ref 3.87–5.11)
RDW: 21.2 % — ABNORMAL HIGH (ref 11.5–15.5)
WBC: 6 10*3/uL (ref 4.0–10.5)
nRBC: 0 % (ref 0.0–0.2)

## 2018-08-20 LAB — URINALYSIS, ROUTINE W REFLEX MICROSCOPIC
Bilirubin Urine: NEGATIVE
Glucose, UA: NEGATIVE mg/dL
Ketones, ur: NEGATIVE mg/dL
Nitrite: NEGATIVE
Protein, ur: NEGATIVE mg/dL
Specific Gravity, Urine: 1.006 (ref 1.005–1.030)
pH: 6 (ref 5.0–8.0)

## 2018-08-20 LAB — WET PREP, GENITAL
Sperm: NONE SEEN
Trich, Wet Prep: NONE SEEN
Yeast Wet Prep HPF POC: NONE SEEN

## 2018-08-20 MED ORDER — METRONIDAZOLE 500 MG PO TABS: 500.0000 mg | ORAL_TABLET | Freq: Two times a day (BID) | ORAL | 0 refills | Status: DC

## 2018-08-20 MED ORDER — MISOPROSTOL 200 MCG PO TABS
800.0000 ug | ORAL_TABLET | Freq: Once | ORAL | Status: AC
  Administered 2018-08-20: 800 ug via BUCCAL
  Filled 2018-08-20: qty 4

## 2018-08-20 MED ORDER — IBUPROFEN 800 MG PO TABS: 800.0000 mg | ORAL_TABLET | Freq: Three times a day (TID) | ORAL | 0 refills | Status: DC | PRN

## 2018-08-20 MED ORDER — FERROUS SULFATE 325 (65 FE) MG PO TABS: 325.0000 mg | ORAL_TABLET | Freq: Two times a day (BID) | ORAL | 0 refills | Status: AC

## 2018-08-20 NOTE — MAU Note (Addendum)
Miscarriage x1week, abd pain score #8, have taken meds but nothing is helping. Have not had any care since miscarriage (had miscarriage at home) [facility] [place] [id number]  Had u/s and baby did not have heart beat and was told that she should pass everything on her own. If not go to [place] bleeding today (bleeding but not heavy). 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 Pt have not take BP meds this morning.

## 2018-08-20 NOTE — Discharge Instructions (Signed)
Return to care   If you have heavier bleeding that soaks through more that 2 pads per hour for an hour or more  If you bleed so much that you feel like you might pass out or you do pass out  If you have significant abdominal pain that is not improved with Tylenol   If you develop a fever > 100.5    Miscarriage A miscarriage is the loss of an unborn baby (fetus) before the 20th week of pregnancy. Most miscarriages happen during the first 3 months of pregnancy. Sometimes, a miscarriage can happen before a woman knows that she is pregnant. Having a miscarriage can be an emotional experience. If you have had a miscarriage, talk with your health care provider about any questions you may have about miscarrying, the grieving process, and your plans for future pregnancy. What are the causes? A miscarriage may be caused by:  Problems with the genes or chromosomes of the fetus. These problems make it impossible for the baby to develop normally. They are often the result of random errors that occur early in the development of the baby, and are not passed from parent to child (not inherited).  Infection of the cervix or uterus.  Conditions that affect hormone balance in the body.  Problems with the cervix, such as the cervix opening and thinning before pregnancy is at term (cervical insufficiency).  Problems with the uterus. These may include: ? A uterus with an abnormal shape. ? Fibroids in the uterus. ? Congenital abnormalities. These are problems that were present at birth.  Certain medical conditions.  Smoking, drinking alcohol, or using drugs.  Injury (trauma). In many cases, the cause of a miscarriage is not known. What are the signs or symptoms? Symptoms of this condition include:  Vaginal bleeding or spotting, with or without cramps or pain.  Pain or cramping in the abdomen or lower back.  Passing fluid, tissue, or blood clots from the vagina. How is this diagnosed? This  condition may be diagnosed based on:  A physical exam.  Ultrasound.  Blood tests.  Urine tests. How is this treated? Treatment for a miscarriage is sometimes not necessary if you naturally pass all the tissue that was in your uterus. If necessary, this condition may be treated with:  Dilation and curettage (D&C). This is a procedure in which the cervix is stretched open and the lining of the uterus (endometrium) is scraped. This is done only if tissue from the fetus or placenta remains in the body (incomplete miscarriage).  Medicines, such as: ? Antibiotic medicine, to treat infection. ? Medicine to help the body pass any remaining tissue. ? Medicine to reduce (contract) the size of the uterus. These medicines may be given if you have a lot of bleeding. If you have Rh negative blood and your baby was Rh positive, you will need a shot of a medicine called Rh immunoglobulinto protect your future babies from Rh blood problems. "Rh-negative" and "Rh-positive" refer to whether or not the blood has a specific protein found on the surface of red blood cells (Rh factor). Follow these instructions at home: Medicines   Take over-the-counter and prescription medicines only as told by your health care provider.  If you were prescribed antibiotic medicine, take it as told by your health care provider. Do not stop taking the antibiotic even if you start to feel better.  Do not take NSAIDs, such as aspirin and ibuprofen, unless they are approved by your health care provider.  These medicines can cause bleeding. Activity  Rest as directed. Ask your health care provider what activities are safe for you.  Have someone help with home and family responsibilities during this time. General instructions  Keep track of the number of sanitary pads you use each day and how soaked (saturated) they are. Write down this information.  Monitor the amount of tissue or blood clots that you pass from your vagina.  Save any large amounts of tissue for your health care provider to examine.  Do not use tampons, douche, or have sex until your health care provider approves.  To help you and your partner with the process of grieving, talk with your health care provider or seek counseling.  When you are ready, meet with your health care provider to discuss any important steps you should take for your health. Also, discuss steps you should take to have a healthy pregnancy in the future.  Keep all follow-up visits as told by your health care provider. This is important. Where to find more information  The American Congress of Obstetricians and Gynecologists: www.acog.org  U.[place] Department of Health and Programmer, systems of Womens Health: [place] Contact a health care provider if:  You have a fever or chills.  You have a foul smelling vaginal discharge.  You have more bleeding instead of less. Get help right away if:  You have severe cramps or pain in your back or abdomen.  You pass blood clots or tissue from your vagina that is walnut-sized or larger.  You soak more than 1 regular sanitary pad in an hour.  You become light-headed or weak.  You pass out.  You have feelings of sadness that take over your thoughts, or you have thoughts of hurting yourself. Summary  Most miscarriages happen in the first 3 months of pregnancy. Sometimes miscarriage happens before a woman even knows that she is pregnant.  Follow your health care provider's instruction for home care. Keep all follow-up appointments.  To help you and your partner with the process of grieving, talk with your health care provider or seek counseling. This information is not intended to replace advice given to you by your health care provider. Make sure you discuss any questions you have with your health care provider. Document Released: 06/18/2000 Document Revised: 04/16/2018 Document Reviewed: 01/29/2016 Elsevier  Patient Education  2020 [employer].

## 2018-08-20 NOTE — MAU Provider Note (Signed)
Chief Complaint: Vaginal Bleeding and Abdominal Pain   First Provider Initiated Contact with Patient 08/20/18 0939     SUBJECTIVE HPI: Shirley [name] is a 33 y.o. [id number] at [redacted]w[redacted]d who presents to Maternity Admissions reporting vaginal bleeding & abdominal pain. Was seen at [facility] ED on 8/3 & had IUP confirmed. Reports vaginal bleeding started last week so she returned to [facility] ED (pt adamant it was definitely that ED) this past Sunday & was told there was no longer a heartbeat. Reports passing some clots after that & is concerned that she has passed the pregnancy. States the bleeding has slowed down somewhat this morning and is more brown, but continues to have lower abdominal cramping. No fever/chills. No intercourse bleeding started.   Location: abdomen Quality: cramping/tender Severity: 8/10 on pain scale Duration: 1 week Timing: intermittent Modifying factors: none Associated signs and symptoms: vaginal bleeding  Past Medical History:  Diagnosis Date  . Eczema   . Hypertension   . Infection    UTI  . Low serum iron   . Ovarian cyst    OB History  Gravida Para Term Preterm AB Living  7 3 1 2 3 4   SAB TAB Ectopic Multiple Live Births  1 2   1 1     # Outcome Date GA Lbr Len/2nd Weight Sex Delivery Anes PTL Lv  7 Current           6 SAB 2013             Birth Comments: System Generated. Please review and update pregnancy details.  5 TAB 2012          4 TAB 2011          3 Preterm 06/03/06 [redacted]w[redacted]d    CS-LTranv  Y      Birth Comments: twins  2 Term 07/01/03    M Vag-Spont  N LIV  1 Preterm 02/25/02 [redacted]w[redacted]d   M CS-LTranv        Birth Comments: NR NST   Past Surgical History:  Procedure Laterality Date  . CESAREAN SECTION    . INDUCED ABORTION    . WISDOM TOOTH EXTRACTION     Social History   Socioeconomic History  . Marital status: Single    Spouse name: Not on file  . Number of children: Not on file  . Years of education: Not on file  . Highest education level: Not on file   Occupational History  . Not on file  Social Needs  . Financial resource strain: Not on file  . Food insecurity    Worry: Not on file    Inability: Not on file  . Transportation needs    Medical: Not on file    Non-medical: Not on file  Tobacco Use  . Smoking status: Former Smoker    Packs/day: 0.50    Years: 2.00    Pack years: 1.00    Types: Cigarettes  . Smokeless tobacco: Never Used  . Tobacco comment: quit 2019  Substance and Sexual Activity  . Alcohol use: No  . Drug use: No  . Sexual activity: Yes    Partners: Male    Birth control/protection: None  Lifestyle  . Physical activity    Days per week: Not on file    Minutes per session: Not on file  . Stress: Not on file  Relationships  . Social Herbalist on phone: Not on file    Gets together: Not on file  Attends religious service: Not on file    Active member of club or organization: Not on file    Attends meetings of clubs or organizations: Not on file    Relationship status: Not on file  . Intimate partner violence    Fear of current or ex partner: Not on file    Emotionally abused: Not on file    Physically abused: Not on file    Forced sexual activity: Not on file  Other Topics Concern  . Not on file  Social History Narrative  . Not on file   Family History  Problem Relation Age of Onset  . Cancer Maternal Grandmother        breast  . Healthy Mother   . COPD Father   . Other Neg Hx   . Hearing loss Neg Hx    No current facility-administered medications on file prior to encounter.    No current outpatient medications on file prior to encounter.   No Known Allergies  I have reviewed patient's Past Medical Hx, Surgical Hx, Family Hx, Social Hx, medications and allergies.   Review of Systems  Constitutional: Negative.   Gastrointestinal: Positive for abdominal pain.  Genitourinary: Positive for vaginal bleeding and vaginal discharge. Negative for dysuria.    OBJECTIVE Patient  Vitals for the past 24 hrs:  BP Temp Pulse Resp Height Weight  08/20/18 1337 (!) 164/95 - 86 16 - -  08/20/18 0934 (!) 153/92 - 75 - - -  08/20/18 0815 - - - - 5\' 1"  (1.549 m) -  08/20/18 0803 (!) 157/98 98.2 F (36.8 C) 82 18 - 78 kg   Constitutional: Well-developed, well-nourished female in no acute distress.  Cardiovascular: normal rate & rhythm, no murmur Respiratory: normal rate and effort. Lung sounds clear throughout GI: Abd soft, non-tender, Pos BS x 4. No guarding or rebound tenderness MS: Extremities nontender, no edema, normal ROM Neurologic: Alert and oriented x 4.    LAB RESULTS Results for orders placed or performed during the hospital encounter of 08/20/18 (from the past 24 hour(s))  CBC with Differential     Status: Abnormal   Collection Time: 08/20/18 10:48 AM  Result Value Ref Range   WBC 6.0 4.0 - 10.5 K/uL   RBC 3.03 (L) 3.87 - 5.11 MIL/uL   Hemoglobin 7.3 (L) 12.0 - 15.0 g/dL   HCT 23.8 (L) 36.0 - 46.0 %   MCV 78.5 (L) 80.0 - 100.0 fL   MCH 24.1 (L) 26.0 - 34.0 pg   MCHC 30.7 30.0 - 36.0 g/dL   RDW 21.2 (H) 11.5 - 15.5 %   Platelets 369 150 - 400 K/uL   nRBC 0.0 0.0 - 0.2 %   Neutrophils Relative % 65 %   Neutro Abs 3.9 1.7 - 7.7 K/uL   Lymphocytes Relative 23 %   Lymphs Abs 1.4 0.7 - 4.0 K/uL   Monocytes Relative 8 %   Monocytes Absolute 0.5 0.1 - 1.0 K/uL   Eosinophils Relative 3 %   Eosinophils Absolute 0.2 0.0 - 0.5 K/uL   Basophils Relative 1 %   Basophils Absolute 0.0 0.0 - 0.1 K/uL   Immature Granulocytes 0 %   Abs Immature Granulocytes 0.01 0.00 - 0.07 K/uL  Urinalysis, Routine w reflex microscopic     Status: Abnormal   Collection Time: 08/20/18 11:02 AM  Result Value Ref Range   Color, Urine STRAW (A) YELLOW   APPearance CLEAR CLEAR   Specific Gravity, Urine 1.006 1.005 -  1.030   pH 6.0 5.0 - 8.0   Glucose, UA NEGATIVE NEGATIVE mg/dL   Hgb urine dipstick SMALL (A) NEGATIVE   Bilirubin Urine NEGATIVE NEGATIVE   Ketones, ur NEGATIVE  NEGATIVE mg/dL   Protein, ur NEGATIVE NEGATIVE mg/dL   Nitrite NEGATIVE NEGATIVE   Leukocytes,Ua TRACE (A) NEGATIVE   RBC / HPF 0-5 0 - 5 RBC/hpf   WBC, UA 0-5 0 - 5 WBC/hpf   Bacteria, UA RARE (A) NONE SEEN   Squamous Epithelial / LPF 0-5 0 - 5  Wet prep, genital     Status: Abnormal   Collection Time: 08/20/18 11:39 AM   Specimen: Cervical/Vaginal swab  Result Value Ref Range   Yeast Wet Prep HPF POC NONE SEEN NONE SEEN   Trich, Wet Prep NONE SEEN NONE SEEN   Clue Cells Wet Prep HPF POC PRESENT (A) NONE SEEN   WBC, Wet Prep HPF POC MANY (A) NONE SEEN   Sperm NONE SEEN     IMAGING US Ob Transvaginal  Result Date: 08/20/2018 CLINICAL DATA:  Vaginal bleeding, history of subchorionic hemorrhage EXAM: TRANSVAGINAL OB ULTRASOUND TECHNIQUE: Transvaginal ultrasound was performed for complete evaluation of the gestation as well as the maternal uterus, adnexal regions, and pelvic cul-de-sac. COMPARISON:  08/09/2018 FINDINGS: Intrauterine gestational sac: Absent Maternal uterus/adnexae: Uterus is well visualized and demonstrates heterogeneous material within the endometrium. No definitive gestational sac is seen and these changes are consistent with interval miscarriage. The ovaries are unremarkable. IMPRESSION: Changes consistent with interval miscarriage when compared with the prior exam. Electronically Signed   By: Inez Catalina M.D.   On: 08/20/2018 11:23    MAU COURSE Orders Placed This Encounter  Procedures  . Wet prep, genital  . US OB Transvaginal  . CBC with Differential  . Urinalysis, Routine w reflex microscopic  . Discharge patient   Meds ordered this encounter  Medications  . misoprostol (CYTOTEC) tablet 800 mcg  . metroNIDAZOLE (FLAGYL) 500 MG tablet    Sig: Take 1 tablet (500 mg total) by mouth 2 (two) times daily.    Dispense:  14 tablet    Refill:  0    Order Specific Question:   Supervising Provider    Answer:   Verita Schneiders A [[id number]]  . ibuprofen (ADVIL) 800 MG  tablet    Sig: Take 1 tablet (800 mg total) by mouth 3 (three) times daily with meals as needed for headache or moderate pain.    Dispense:  30 tablet    Refill:  0    Order Specific Question:   Supervising Provider    Answer:   Verita Schneiders A [[id number]]  . ferrous sulfate (FERROUSUL) 325 (65 FE) MG tablet    Sig: Take 1 tablet (325 mg total) by mouth 2 (two) times daily.    Dispense:  60 tablet    Refill:  0    Order Specific Question:   Supervising Provider    Answer:   Verita Schneiders A [[id number]]    MDM No u/s or visit seen in epic since visit on 8/3. Ultrasound today confirmed completed SAB & some heterogeneous material in endometrium.   RH positive.   Hemoglobin is low, 7.3. Pt has longstanding history of anemia. States she takes iron supplements but not daily d/t constipation. Reviewed labs & imaging with Dr. Roselie Awkward. Agrees patient is stable for dose of cytotec.  Cytotec given prior to discharge.   ASSESSMENT 1. Spontaneous miscarriage   2. Vaginal bleeding in pregnancy, first trimester  3. Bacterial vaginosis   4. Anemia, unspecified type     PLAN Discharge home in stable condition. Bleeding/infection precautions Rx flagyl, iron supplement, & ibuprofen GC/CT pending Msg to [facility] for SAB follow up appointments  Follow-up Information    [facility].   Why: the office will call you to schedule an appointment Contact information: [place] [phone] [facility].   Specialty: Obstetrics and Gynecology Why: return for worsening symptoms Contact information: [address] [id number] [place] [phone]         Allergies as of 08/20/2018   No Known Allergies     Medication List    TAKE these medications   ferrous sulfate 325 (65 FE) MG tablet Commonly known as: FerrouSul Take 1 tablet (325 mg  total) by mouth 2 (two) times daily.   ibuprofen 800 MG tablet Commonly known as: ADVIL Take 1 tablet (800 mg total) by mouth 3 (three) times daily with meals as needed for headache or moderate pain. What changed:   when to take this  reasons to take this   metroNIDAZOLE 500 MG tablet Commonly known as: FLAGYL Take 1 tablet (500 mg total) by mouth 2 (two) times daily.        Jorje Guild, NP 08/20/2018  4:01 PM

## 2018-08-21 LAB — GC/CHLAMYDIA PROBE AMP (~~LOC~~) NOT AT ARMC
Chlamydia: NEGATIVE
Neisseria Gonorrhea: NEGATIVE

## 2018-08-27 ENCOUNTER — Other Ambulatory Visit: Payer: Medicaid Other

## 2018-09-01 ENCOUNTER — Other Ambulatory Visit: Payer: Medicaid Other

## 2018-09-02 ENCOUNTER — Other Ambulatory Visit: Payer: Medicaid Other

## 2018-09-03 ENCOUNTER — Telehealth: Payer: Self-pay | Admitting: Obstetrics

## 2018-09-10 ENCOUNTER — Telehealth: Payer: Self-pay | Admitting: Obstetrics

## 2018-11-01 ENCOUNTER — Emergency Department (HOSPITAL_BASED_OUTPATIENT_CLINIC_OR_DEPARTMENT_OTHER): Payer: 59

## 2018-11-01 ENCOUNTER — Encounter (HOSPITAL_BASED_OUTPATIENT_CLINIC_OR_DEPARTMENT_OTHER): Payer: Self-pay

## 2018-11-01 ENCOUNTER — Other Ambulatory Visit: Payer: Self-pay

## 2018-11-01 ENCOUNTER — Emergency Department (HOSPITAL_BASED_OUTPATIENT_CLINIC_OR_DEPARTMENT_OTHER)
Admission: EM | Admit: 2018-11-01 | Discharge: 2018-11-01 | Disposition: A | Discharge status: Home / Self Care | Payer: 59 | Attending: Emergency Medicine | Admitting: Emergency Medicine

## 2018-11-01 DIAGNOSIS — S161XXA Strain of muscle, fascia and tendon at neck level, initial encounter: Secondary | ICD-10-CM | POA: Diagnosis not present

## 2018-11-01 DIAGNOSIS — I10 Essential (primary) hypertension: Secondary | ICD-10-CM | POA: Diagnosis not present

## 2018-11-01 DIAGNOSIS — Y999 Unspecified external cause status: Secondary | ICD-10-CM | POA: Diagnosis not present

## 2018-11-01 DIAGNOSIS — S40011A Contusion of right shoulder, initial encounter: Secondary | ICD-10-CM | POA: Diagnosis not present

## 2018-11-01 DIAGNOSIS — Y939 Activity, unspecified: Secondary | ICD-10-CM | POA: Insufficient documentation

## 2018-11-01 DIAGNOSIS — Y92410 Unspecified street and highway as the place of occurrence of the external cause: Secondary | ICD-10-CM | POA: Diagnosis not present

## 2018-11-01 DIAGNOSIS — S199XXA Unspecified injury of neck, initial encounter: Secondary | ICD-10-CM | POA: Diagnosis present

## 2018-11-01 DIAGNOSIS — Z87891 Personal history of nicotine dependence: Secondary | ICD-10-CM | POA: Diagnosis not present

## 2018-11-01 MED ORDER — DEXAMETHASONE SODIUM PHOSPHATE 10 MG/ML IJ SOLN
INTRAMUSCULAR | Status: AC
  Filled 2018-11-01: qty 1

## 2018-11-01 MED ORDER — IBUPROFEN 800 MG PO TABS: 800.0000 mg | ORAL_TABLET | Freq: Three times a day (TID) | ORAL | 0 refills | Status: AC

## 2018-11-01 MED ORDER — DIAZEPAM 5 MG PO TABS: 5.0000 mg | ORAL_TABLET | Freq: Two times a day (BID) | ORAL | 0 refills | Status: DC

## 2018-11-01 MED ORDER — IBUPROFEN 400 MG PO TABS
600.0000 mg | ORAL_TABLET | Freq: Once | ORAL | Status: AC
  Administered 2018-11-01: 600 mg via ORAL
  Filled 2018-11-01: qty 1

## 2018-11-01 MED ORDER — HYDROCODONE-ACETAMINOPHEN 5-325 MG PO TABS
1.0000 | ORAL_TABLET | Freq: Once | ORAL | Status: AC
  Administered 2018-11-01: 1 via ORAL
  Filled 2018-11-01: qty 1

## 2018-11-01 MED ORDER — OXYMETAZOLINE HCL 0.05 % NA SOLN
NASAL | Status: AC
  Filled 2018-11-01: qty 30
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
## 2018-11-23 ENCOUNTER — Other Ambulatory Visit: Payer: Self-pay

## 2018-11-23 ENCOUNTER — Ambulatory Visit: Payer: Self-pay

## 2018-11-23 ENCOUNTER — Other Ambulatory Visit: Payer: Self-pay | Admitting: Family Medicine

## 2018-11-23 DIAGNOSIS — M25552 Pain in left hip: Secondary | ICD-10-CM

## 2018-11-25 ENCOUNTER — Emergency Department (HOSPITAL_BASED_OUTPATIENT_CLINIC_OR_DEPARTMENT_OTHER)
Admission: EM | Admit: 2018-11-25 | Discharge: 2018-11-25 | Disposition: A | Discharge status: Home / Self Care | Payer: Medicaid Other | Attending: Emergency Medicine | Admitting: Emergency Medicine

## 2018-11-25 ENCOUNTER — Other Ambulatory Visit: Payer: Self-pay

## 2018-11-25 ENCOUNTER — Encounter (HOSPITAL_BASED_OUTPATIENT_CLINIC_OR_DEPARTMENT_OTHER): Payer: Self-pay | Admitting: *Deleted

## 2018-11-25 ENCOUNTER — Emergency Department (HOSPITAL_BASED_OUTPATIENT_CLINIC_OR_DEPARTMENT_OTHER): Payer: Medicaid Other

## 2018-11-25 DIAGNOSIS — M545 Low back pain, unspecified: Secondary | ICD-10-CM

## 2018-11-25 DIAGNOSIS — M25512 Pain in left shoulder: Secondary | ICD-10-CM | POA: Diagnosis not present

## 2018-11-25 DIAGNOSIS — I10 Essential (primary) hypertension: Secondary | ICD-10-CM | POA: Diagnosis not present

## 2018-11-25 DIAGNOSIS — F17210 Nicotine dependence, cigarettes, uncomplicated: Secondary | ICD-10-CM | POA: Insufficient documentation

## 2018-11-25 DIAGNOSIS — M25532 Pain in left wrist: Secondary | ICD-10-CM | POA: Diagnosis present

## 2018-11-25 DIAGNOSIS — W19XXXA Unspecified fall, initial encounter: Secondary | ICD-10-CM

## 2018-11-25 MED ORDER — ACETAMINOPHEN 500 MG PO TABS
1000.0000 mg | ORAL_TABLET | Freq: Once | ORAL | Status: AC
  Administered 2018-11-25: 17:00:00 1000 mg via ORAL
  Filled 2018-11-25: qty 2

## 2018-11-25 MED ORDER — METHOCARBAMOL 500 MG PO TABS
500.0000 mg | ORAL_TABLET | Freq: Once | ORAL | Status: AC
  Administered 2018-11-25: 17:00:00 500 mg via ORAL
  Filled 2018-11-25: qty 1

## 2018-11-25 MED ORDER — METHOCARBAMOL 500 MG PO TABS: 500.0000 mg | ORAL_TABLET | Freq: Two times a day (BID) | ORAL | 0 refills | Status: DC

## 2018-11-25 NOTE — ED Provider Notes (Signed)
[facility] EMERGENCY DEPARTMENT Provider Note   CSN: [id number] Arrival date & time: 11/25/18  1625     History   Chief Complaint Chief Complaint  Patient presents with  . Fall    HPI Shirley [name] is a 33 y.o. female.     Shirley [name] is a 33 y.o. female with a history of hypertension, eczema, and ovarian cyst, who presents to the ED for evaluation after a fall.  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  She was initially evaluated at occupational health and had x-rays of her left hip and pelvis done which were unremarkable.  She reports that she is continued to have some pain in this area but is also complaining of pain in her left shoulder that is worse with range of motion.  She also reports pain in the left wrist that she noticed primarily when she tries to bear weight on the wrist.  She denies any numbness tingling or weakness in the left arm.  She denies any associated neck pain.  She also reports that she has had some midline low back pain that is worse when bending forward.  She denies any numbness tingling or weakness in her lower extremities, no loss of bowel or bladder control or saddle anesthesia.  Patient reports she has been taking ibuprofen with some improvement, but was concerned about continued pain since she did not have any imaging of these areas when she was initially evaluated.     Past Medical History:  Diagnosis Date  . Eczema   . Hypertension   . Infection    UTI  . Low serum iron   . Ovarian cyst     There are no active problems to display for this patient.   Past Surgical History:  Procedure Laterality Date  . CESAREAN SECTION    . INDUCED ABORTION    . WISDOM TOOTH EXTRACTION       OB History    Gravida  7   Para  3   Term  1   Preterm  2   AB  3   Living  4    SAB  1   TAB  2   Ectopic      Multiple  1   Live Births  1            Home Medications    Prior to Admission medications   Medication Sig Start Date End Date Taking? Authorizing Provider  diazepam (VALIUM) 5 MG tablet Take 1 tablet (5 mg total) by mouth 2 (two) times daily. 11/01/18   Isla Pence, MD  ferrous sulfate (FERROUSUL) 325 (65 FE) MG tablet Take 1 tablet (325 mg total) by mouth 2 (two) times daily. 08/20/18   Jorje Guild, NP  ibuprofen (ADVIL) 800 MG tablet Take 1 tablet (800 mg total) by mouth 3 (three) times daily. 11/01/18   Isla Pence, MD  methocarbamol (ROBAXIN) 500 MG tablet Take 1 tablet (500 mg total) by mouth 2 (two) times daily. 11/25/18   Jacqlyn Larsen, PA-C    Family History Family History  Problem Relation Age of Onset  . Cancer Maternal Grandmother        breast  . Healthy Mother   . COPD Father   . Other Neg Hx   . Hearing  loss Neg Hx     Social History Social History   Tobacco Use  . Smoking status: Current Every Day Smoker    Packs/day: 0.50    Years: 2.00    Pack years: 1.00    Types: Cigarettes  . Smokeless tobacco: Never Used  . Tobacco comment: quit 2019  Substance Use Topics  . Alcohol use: No  . Drug use: No     Allergies   Patient has no known allergies.   Review of Systems Review of Systems  Constitutional: Negative for chills and fever.  Musculoskeletal: Positive for arthralgias and back pain. Negative for joint swelling and neck pain.  Skin: Negative for color change and wound.  Neurological: Negative for weakness and numbness.  All other systems reviewed and are negative.    Physical Exam Updated Vital Signs BP (!) 160/111   Pulse 80   Temp 97.6 F (36.4 C)   Resp 16   Ht 5\' 1"  (1.549 m)   Wt 75 kg   LMP 11/23/2018 Comment: recent miscarriage  SpO2 100%   BMI 31.24 kg/m   Physical Exam Vitals signs and nursing note reviewed.  Constitutional:      General: She is not in acute  distress.    Appearance: Normal appearance. She is well-developed and normal weight. She is not ill-appearing or diaphoretic.  HENT:     Head: Normocephalic and atraumatic.  Eyes:     General:        Right eye: No discharge.        Left eye: No discharge.  Neck:     Musculoskeletal: Neck supple.  Cardiovascular:     Pulses:          Radial pulses are 2+ on the right side and 2+ on the left side.       Dorsalis pedis pulses are 2+ on the right side and 2+ on the left side.       Posterior tibial pulses are 2+ on the right side and 2+ on the left side.  Pulmonary:     Effort: Pulmonary effort is normal. No respiratory distress.  Abdominal:     Comments: Abdomen soft, nondistended, nontender to palpation in all quadrants without guarding or peritoneal signs, no CVA tenderness bilaterally  Musculoskeletal:     Comments: Tenderness to palpation over the left shoulder, without appreciable deformity or edema.  Tenderness primarily noted over the glenohumeral joint and deltoid.  No tenderness over the elbow.  Patient also has some tenderness over the left wrist that is made worse with range of motion.  No focal snuffbox tenderness. Patient has some midline lumbar tenderness with no palpable deformity.  Normal range of motion of bilateral lower extremities.  No palpable deformity or ecchymosis over the left hip.  Patient is ambulatory with weightbearing without difficulty.  Skin:    General: Skin is warm and dry.     Capillary Refill: Capillary refill takes less than 2 seconds.  Neurological:     Mental Status: She is alert and oriented to person, place, and time.     Coordination: Coordination normal.     Comments: Alert, clear speech, following commands. Moving all extremities without difficulty. Ambulatory with steady gait  Psychiatric:        Mood and Affect: Mood normal.        Behavior: Behavior normal.      ED Treatments / Results  Labs (all labs ordered are listed, but only  abnormal results are displayed)  Labs Reviewed - No data to display  EKG None  Radiology Dg Lumbar Spine Complete  Result Date: 11/25/2018 CLINICAL DATA:  Fall EXAM: LUMBAR SPINE - COMPLETE 4+ VIEW COMPARISON:  None. FINDINGS: There is no evidence of lumbar spine fracture. Alignment is normal. Intervertebral disc spaces are maintained. IMPRESSION: Negative. Electronically Signed   By: Donavan Foil M.D.   On: 11/25/2018 17:51   Dg Wrist Complete Left  Result Date: 11/25/2018 CLINICAL DATA:  Fall EXAM: LEFT WRIST - COMPLETE 3+ VIEW COMPARISON:  None. FINDINGS: There is no evidence of fracture or dislocation. There is no evidence of arthropathy or other focal bone abnormality. Soft tissues are unremarkable. IMPRESSION: Negative. Electronically Signed   By: Donavan Foil M.D.   On: 11/25/2018 17:52   Dg Shoulder Left  Result Date: 11/25/2018 CLINICAL DATA:  Fall EXAM: LEFT SHOULDER - 2+ VIEW COMPARISON:  None. FINDINGS: There is no evidence of fracture or dislocation. There is no evidence of arthropathy or other focal bone abnormality. Soft tissues are unremarkable. Possible os acromiale. IMPRESSION: No acute osseous abnormality. Electronically Signed   By: Donavan Foil M.D.   On: 11/25/2018 17:52    Procedures Procedures (including critical care time)  Medications Ordered in ED Medications  acetaminophen (TYLENOL) tablet 1,000 mg (1,000 mg Oral Given 11/25/18 1708)  methocarbamol (ROBAXIN) tablet 500 mg (500 mg Oral Given 11/25/18 1708)     Initial Impression / Assessment and Plan / ED Course  I have reviewed the triage vital signs and the nursing notes.  Pertinent labs & imaging results that were available during my care of the patient were reviewed by me and considered in my medical decision making (see chart for details).  Patient presents for evaluation due to continued pain after fall 2 days ago, was seen by occupational health and had negative x-rays of the hip and pelvis  but reports pain in the left shoulder, wrist and low back.  Tenderness to palpation in these areas without obvious deformity or swelling.  Neurovascularly intact.  No loss of bowel or bladder control or saddle anesthesia.  X-rays of the low back, wrist and shoulder show no acute bony abnormalities or fracture.  Suspect mild wrist sprain, provided with Velcro wrist splint.  Encourage patient to continue ibuprofen and to use Robaxin and Tylenol in addition to this and continue ice and heat.  Sports medicine follow-up if symptoms not improving in about a week.  Return precautions discussed.  Patient expresses understanding and agreement with plan.  Discharged home in good condition.  Final Clinical Impressions(s) / ED Diagnoses   Final diagnoses:  Fall, initial encounter  Left wrist pain  Acute pain of left shoulder  Acute midline low back pain without sciatica    ED Discharge Orders         Ordered    methocarbamol (ROBAXIN) 500 MG tablet  2 times daily     11/25/18 1804           Jacqlyn Larsen, [place] 11/25/18 2341    Lucrezia Starch, MD 11/27/18 [phone]

## 2018-11-25 NOTE — ED Triage Notes (Signed)
Pt c/o fall x 2 days ago  Left hip and shoulder pain , hip and pelvis xray done at occ health x 2 days ago

## 2018-11-25 NOTE — ED Notes (Signed)
Patient transported to X-ray 

## 2018-11-25 NOTE — ED Notes (Signed)
PT states unable to give urine sample and will consent to xray without it. Provider aware.

## 2018-11-25 NOTE — Discharge Instructions (Signed)
Continue taking ibuprofen you can take Tylenol in addition to this and use prescribed Robaxin as needed for muscle spasm, this medication can cause drowsiness do not take before driving.  Continue to use ice and heat.  Your x-rays are negative today.  If you continue experiencing pain that is not improving over the next week and follow-up with Dr. Raeford Razor with sports medicine.

## 2018-12-06 ENCOUNTER — Emergency Department (HOSPITAL_BASED_OUTPATIENT_CLINIC_OR_DEPARTMENT_OTHER)
Admission: EM | Admit: 2018-12-06 | Discharge: 2018-12-06 | Disposition: A | Discharge status: Home / Self Care | Payer: 59 | Attending: Emergency Medicine | Admitting: Emergency Medicine

## 2018-12-06 ENCOUNTER — Other Ambulatory Visit: Payer: Self-pay

## 2018-12-06 ENCOUNTER — Encounter (HOSPITAL_BASED_OUTPATIENT_CLINIC_OR_DEPARTMENT_OTHER): Payer: Self-pay | Admitting: Emergency Medicine

## 2018-12-06 DIAGNOSIS — M25512 Pain in left shoulder: Secondary | ICD-10-CM | POA: Insufficient documentation

## 2018-12-06 DIAGNOSIS — F17210 Nicotine dependence, cigarettes, uncomplicated: Secondary | ICD-10-CM | POA: Insufficient documentation

## 2018-12-06 NOTE — Discharge Instructions (Signed)
You was seen in our ER with persistent pain in your left shoulder blade region approximately 2 weeks after a fall.  As I explained to you, it does not appear you have any broken bones. However, it is possible that you have an injury to your rotator cuff muscles in your left shoulder.  This is a non-emergent condition. The only way to determine this diagnosis definitively is with an MRI, which can be done as an outpatient.  You can call your primary care doctor about arranging for an MRI. Alternatively, you can schedule an appointment with orthopedic doctor and see if they would recommend an MRI.

## 2018-12-06 NOTE — ED Triage Notes (Signed)
Pt seen here for same injury in 11/19.  Presents today because the pain in her left arm and left shoulder are worse.  Also pain in her left hip and in low back.  Has seen [facility] doctor since the initial injury and pt sts that doc will not write her off work and the pain at work in increasing.

## 2018-12-06 NOTE — ED Provider Notes (Signed)
[facility] EMERGENCY DEPARTMENT Provider Note   CSN: [id number] Arrival date & time: 12/06/18  [id number]     History   Chief Complaint Left shoulder pain  HPI Shirley [name] is a 33 y.o. femalep resenting to ED with persistent pain in her left shoulder since 11/19.  Pt seen at that time after a mechanical fall at work onto her left side.  She continues to wear a left wrist splint and complains of pain in her left shoulder blade with lifting and activity.  She has been doing light duties at work.  She has tried motrin/tylenol and lidocaine patches and cream.  She states the muscle relaxers cannot be used prior to work or driving, and she feels her pain has been persistent.  No numbness in her arm.     HPI  Past Medical History:  Diagnosis Date  . Eczema   . Hypertension   . Infection    UTI  . Low serum iron   . Ovarian cyst     There are no active problems to display for this patient.   Past Surgical History:  Procedure Laterality Date  . CESAREAN SECTION    . INDUCED ABORTION    . WISDOM TOOTH EXTRACTION       OB History    Gravida  7   Para  3   Term  1   Preterm  2   AB  3   Living  4     SAB  1   TAB  2   Ectopic      Multiple  1   Live Births  1            Home Medications    Prior to Admission medications   Medication Sig Start Date End Date Taking? Authorizing Provider  diazepam (VALIUM) 5 MG tablet Take 1 tablet (5 mg total) by mouth 2 (two) times daily. 11/01/18   Isla Pence, MD  ferrous sulfate (FERROUSUL) 325 (65 FE) MG tablet Take 1 tablet (325 mg total) by mouth 2 (two) times daily. 08/20/18   Jorje Guild, NP  ibuprofen (ADVIL) 800 MG tablet Take 1 tablet (800 mg total) by mouth 3 (three) times daily. 11/01/18   Isla Pence, MD  methocarbamol (ROBAXIN) 500 MG tablet Take 1 tablet (500 mg total) by mouth 2 (two) times daily. 11/25/18   Jacqlyn Larsen, PA-C    Family History Family History  Problem Relation  Age of Onset  . Cancer Maternal Grandmother        breast  . Healthy Mother   . COPD Father   . Other Neg Hx   . Hearing loss Neg Hx     Social History Social History   Tobacco Use  . Smoking status: Current Every Day Smoker    Packs/day: 0.50    Years: 2.00    Pack years: 1.00    Types: Cigarettes  . Smokeless tobacco: Never Used  . Tobacco comment: quit 2019  Substance Use Topics  . Alcohol use: No  . Drug use: No     Allergies   Patient has no known allergies.   Review of Systems Review of Systems  Constitutional: Negative for chills and fever.  Respiratory: Negative for cough and shortness of breath.   Cardiovascular: Negative for chest pain and palpitations.  Musculoskeletal: Positive for arthralgias, back pain, myalgias and neck pain.  Skin: Negative for pallor and rash.  Neurological: Positive for weakness. Negative for numbness.  Psychiatric/Behavioral: Negative for agitation and confusion.  All other systems reviewed and are negative.    Physical Exam Updated Vital Signs BP (!) 174/105   Pulse 87   Temp 98.9 F (37.2 C) (Oral)   Resp 19   Ht 5\' 1"  (1.549 m)   Wt 74.8 kg   LMP 11/23/2018 Comment: recent miscarriage  SpO2 100%   BMI 31.18 kg/m   Physical Exam Vitals signs and nursing note reviewed.  Constitutional:      General: She is not in acute distress.    Appearance: She is well-developed.  HENT:     Head: Normocephalic and atraumatic.  Eyes:     Conjunctiva/sclera: Conjunctivae normal.  Neck:     Musculoskeletal: Neck supple.  Cardiovascular:     Rate and Rhythm: Normal rate and regular rhythm.     Pulses: Normal pulses.     Heart sounds: No murmur.  Pulmonary:     Effort: Pulmonary effort is normal. No respiratory distress.     Breath sounds: Normal breath sounds.  Abdominal:     General: Abdomen is flat.     Palpations: Abdomen is soft.     Tenderness: There is no abdominal tenderness.  Musculoskeletal:     Comments: LUE:  Full ROM at shoulder and elbow. Pain with flexion of left shoulder, but able to passively and actively flex and extend shoulder. No pain with flexion / extension / pronation / supination at elbow. Left wrist non-tender. No snuffbox tenderness. No pain with axial loading of L thumb. No superficial tenderness (skin) of L palm. No tenderness of dorsal metacarpal bones. Intact opposition, finger abduction, and wrist flexion / extension.  Skin:    General: Skin is warm and dry.  Neurological:     General: No focal deficit present.     Mental Status: She is alert.     Sensory: No sensory deficit.  Psychiatric:        Mood and Affect: Mood normal.        Behavior: Behavior normal.      ED Treatments / Results  Labs (all labs ordered are listed, but only abnormal results are displayed) Labs Reviewed - No data to display  EKG None  Radiology No results found.  Procedures Procedures (including critical care time)  Medications Ordered in ED Medications - No data to display   Initial Impression / Assessment and Plan / ED Course  I have reviewed the triage vital signs and the nursing notes.  Pertinent labs & imaging results that were available during my care of the patient were reviewed by me and considered in my medical decision making (see chart for details).   33 yo female here with 2 weeks of left shoulder pain after a fall on her shoulder.  Initial xrays of left shoulder and wrist and hip negative immediately after the event.  She continues to have left shoulder pain prominently.  No concerning symptoms for scaphoid or wrist fx on my exam.  No ttp of the anatomic snuffbox.  Distal arm is neurovascularly intact.  Possible shoulder impingement syndrome or rotator cuff injury.  Advised she continue light duties with left arm for another 2 weeks and see an orthopedist.  I cannot offer her opioid medications or stronger pain medications beyond the regimen she is already on. She  verbalizes understanding.   Final Clinical Impressions(s) / ED Diagnoses   Final diagnoses:  Left shoulder pain, unspecified chronicity    ED Discharge Orders  None       Wyvonnia Dusky, MD 12/06/18 [phone]

## 2018-12-31 ENCOUNTER — Emergency Department (HOSPITAL_BASED_OUTPATIENT_CLINIC_OR_DEPARTMENT_OTHER)
Admission: EM | Admit: 2018-12-31 | Discharge: 2018-12-31 | Disposition: A | Discharge status: Home / Self Care | Payer: PRIVATE HEALTH INSURANCE | Attending: Emergency Medicine | Admitting: Emergency Medicine

## 2018-12-31 ENCOUNTER — Other Ambulatory Visit: Payer: Self-pay

## 2018-12-31 ENCOUNTER — Emergency Department (HOSPITAL_BASED_OUTPATIENT_CLINIC_OR_DEPARTMENT_OTHER): Payer: PRIVATE HEALTH INSURANCE

## 2018-12-31 ENCOUNTER — Encounter (HOSPITAL_BASED_OUTPATIENT_CLINIC_OR_DEPARTMENT_OTHER): Payer: Self-pay | Admitting: *Deleted

## 2018-12-31 DIAGNOSIS — W260XXA Contact with knife, initial encounter: Secondary | ICD-10-CM | POA: Diagnosis not present

## 2018-12-31 DIAGNOSIS — F17210 Nicotine dependence, cigarettes, uncomplicated: Secondary | ICD-10-CM | POA: Insufficient documentation

## 2018-12-31 DIAGNOSIS — I10 Essential (primary) hypertension: Secondary | ICD-10-CM | POA: Insufficient documentation

## 2018-12-31 DIAGNOSIS — Y929 Unspecified place or not applicable: Secondary | ICD-10-CM | POA: Insufficient documentation

## 2018-12-31 DIAGNOSIS — Z79899 Other long term (current) drug therapy: Secondary | ICD-10-CM | POA: Diagnosis not present

## 2018-12-31 DIAGNOSIS — Y999 Unspecified external cause status: Secondary | ICD-10-CM | POA: Diagnosis not present

## 2018-12-31 DIAGNOSIS — S61012A Laceration without foreign body of left thumb without damage to nail, initial encounter: Secondary | ICD-10-CM | POA: Diagnosis not present

## 2018-12-31 DIAGNOSIS — Y93G1 Activity, food preparation and clean up: Secondary | ICD-10-CM | POA: Insufficient documentation

## 2018-12-31 DIAGNOSIS — S61112A Laceration without foreign body of left thumb with damage to nail, initial encounter: Secondary | ICD-10-CM

## 2018-12-31 DIAGNOSIS — S6992XA Unspecified injury of left wrist, hand and finger(s), initial encounter: Secondary | ICD-10-CM | POA: Diagnosis present

## 2018-12-31 NOTE — ED Notes (Signed)
dermabond at bedside for provider

## 2018-12-31 NOTE — Discharge Instructions (Signed)
You have been diagnosed today with laceration of the left thumb.  At this time there does not appear to be the presence of an emergent medical condition, however there is always the potential for conditions to change. Please read and follow the below instructions.  Please return to the Emergency Department immediately for any new or worsening symptoms. Please be sure to follow up with your Primary Care Provider within one week regarding your visit today; please call their office to schedule an appointment even if you are feeling better for a follow-up visit. The skin glue should fall off on its own in the next several days.  Avoid soaking your hand so that the glue does not come off prematurely.  Please follow-up with your primary care provider next week for recheck of the area and return to the emergency department immediately if you develop any signs of infection. As we discussed I advised that you have your tetanus shot updated today.  Since you have decided not to have your Tdap shot here in the emergency department I strongly advised that you call your primary care provider to schedule a tetanus shot soon.  Return to the ER right away if: Have any of the following: A fever. Chills. Redness, swelling, or pain around your wound. Fluid or blood coming from your wound. Pus or a bad smell coming from your wound. Notice that your wound feels warm to the touch. Notice that the edges of your wound start to separate after your glue comes off. You have any new/concerning or worsening of symptoms  Please read the additional information packets attached to your discharge summary.  Do not take your medicine if  develop an itchy rash, swelling in your mouth or lips, or difficulty breathing; call 911 and seek immediate emergency medical attention if this occurs.  Note: Portions of this text may have been transcribed using voice recognition software. Every effort was made to ensure accuracy; however,  inadvertent computerized transcription errors may still be present.

## 2018-12-31 NOTE — ED Triage Notes (Signed)
Laceration to left thumb. No bleeding noted.  Cut tip of nail off.

## 2018-12-31 NOTE — ED Notes (Signed)
ED Provider at bedside. 

## 2018-12-31 NOTE — ED Provider Notes (Signed)
[name] Provider Note   CSN: [id number] Arrival date & time: 12/31/18  1507     History Chief Complaint  Patient presents with  . Finger Injury    Shirley [name] is a 33 y.o. female presents today for laceration of the left thumb.  She reports that 1-2 hours prior to arrival she was cutting potatoes when she slipped slicing the tip of her left thumb.  She reports she had a mild sharp pain at the area nonradiating that self resolved, no aggravating or alleviating factors.  She reports she is not any pain on arrival to the ED.  She controlled bleeding with direct pressure.  Denies fever/chills, headache, shortness of breath, abdominal pain, numbness/weakness, tingling or any additional concerns.  HPI     Past Medical History:  Diagnosis Date  . Eczema   . Hypertension   . Infection    UTI  . Low serum iron   . Ovarian cyst     There are no problems to display for this patient.   Past Surgical History:  Procedure Laterality Date  . CESAREAN SECTION    . INDUCED ABORTION    . WISDOM TOOTH EXTRACTION       OB History    Gravida  7   Para  3   Term  1   Preterm  2   AB  3   Living  4     SAB  1   TAB  2   Ectopic      Multiple  1   Live Births  1           Family History  Problem Relation Age of Onset  . Cancer Maternal Grandmother        breast  . Healthy Mother   . COPD Father   . Other Neg Hx   . Hearing loss Neg Hx     Social History   Tobacco Use  . Smoking status: Current Every Day Smoker    Packs/day: 0.50    Years: 2.00    Pack years: 1.00    Types: Cigarettes  . Smokeless tobacco: Never Used  . Tobacco comment: quit 2019  Substance Use Topics  . Alcohol use: No  . Drug use: No    Home Medications Prior to Admission medications   Medication Sig Start Date End Date Taking? Authorizing Provider  Carvedilol (COREG PO) Take by mouth.   Yes [provider]  ferrous sulfate  (FERROUSUL) 325 (65 FE) MG tablet Take 1 tablet (325 mg total) by mouth 2 (two) times daily. 08/20/18  Yes Jorje Guild, NP  ibuprofen (ADVIL) 800 MG tablet Take 1 tablet (800 mg total) by mouth 3 (three) times daily. 11/01/18  Yes Isla Pence, MD  methocarbamol (ROBAXIN) 500 MG tablet Take 1 tablet (500 mg total) by mouth 2 (two) times daily. 11/25/18  Yes Jacqlyn Larsen, PA-C  diazepam (VALIUM) 5 MG tablet Take 1 tablet (5 mg total) by mouth 2 (two) times daily. 11/01/18   Isla Pence, MD    Allergies    Patient has no known allergies.  Review of Systems   Review of Systems Ten systems are reviewed and are negative for acute change except as noted in the HPI  Physical Exam Updated Vital Signs BP (!) 146/80   Pulse 83   Temp 98.4 F (36.9 C) (Oral)   Resp 18   Ht 5\' 1"  (1.549 m)   Wt 70.3  kg   LMP 12/10/2017 Comment: recent miscarriage  SpO2 98%   BMI 29.29 kg/m   Physical Exam Constitutional:      General: She is not in acute distress.    Appearance: Normal appearance. She is well-developed. She is not ill-appearing or diaphoretic.  HENT:     Head: Normocephalic and atraumatic.     Right Ear: External ear normal.     Left Ear: External ear normal.     Nose: Nose normal.  Eyes:     General: Vision grossly intact. Gaze aligned appropriately.     Pupils: Pupils are equal, round, and reactive to light.  Neck:     Trachea: Trachea and phonation normal. No tracheal deviation.  Cardiovascular:     Rate and Rhythm: Normal rate and regular rhythm.     Pulses:          Radial pulses are 2+ on the left side.  Pulmonary:     Effort: Pulmonary effort is normal. No respiratory distress.  Abdominal:     General: There is no distension.     Palpations: Abdomen is soft.     Tenderness: There is no abdominal tenderness. There is no guarding or rebound.  Musculoskeletal:        General: Normal range of motion.     Cervical back: Normal range of motion.     Comments: Left  hand: 7 mm superficial laceration of the tip of the left thumb involving small portion of the fingernail.  Otherwise hand appears normal without deformities or other injuries.  No tenderness to palpation. No snuffbox tenderness to palpation. No tenderness to palpation over flexor sheath.  Finger adduction/abduction intact with 5/5 strength.  Thumb opposition intact. Full active and resisted ROM to flexion/extension at wrist, MCP, PIP and DIP of all fingers.  FDS/FDP intact. Grip 5/5 strength.  Radial artery 2+ with <2sec cap refill in all fingers.  Sensation intact to light-tough in median/ulnar/radial distributions.  Compartments soft.  Skin:    General: Skin is warm and dry.  Neurological:     Mental Status: She is alert.     GCS: GCS eye subscore is 4. GCS verbal subscore is 5. GCS motor subscore is 6.     Comments: Speech is clear and goal oriented, follows commands Major Cranial nerves without deficit, no facial droop Moves extremities without ataxia, coordination intact  Psychiatric:        Behavior: Behavior normal.       ED Results / Procedures / Treatments   Labs (all labs ordered are listed, but only abnormal results are displayed) Labs Reviewed - No data to display  EKG None  Radiology DG Finger Thumb Left  Result Date: 12/31/2018 CLINICAL DATA:  33 year old who sustained a laceration to the distal tip of the LEFT thumb at home earlier today while cutting food. Initial encounter. EXAM: LEFT THUMB 2+V COMPARISON:  None. FINDINGS: Very mild soft tissue injury at the distal tip of the tuft. No opaque foreign bodies in the soft tissues. No underlying osseous abnormality. Well-preserved joint spaces. Well-preserved bone mineral density. IMPRESSION: 1. Very mild soft tissue injury at the distal tip of the tuft. No opaque foreign bodies in the soft tissues. 2. No osseous abnormality. Electronically Signed   By: Evangeline Dakin M.D.   On: 12/31/2018 15:56   Procedures  .Marland KitchenLaceration Repair  Date/Time: 12/31/2018 4:54 PM Performed by: Deliah Boston, PA-C Authorized by: Deliah Boston, PA-C   Consent:    Consent  obtained:  Verbal   Consent given by:  Patient   Risks discussed:  Infection, need for additional repair, nerve damage, poor cosmetic result, poor wound healing, pain, retained foreign body, tendon damage and vascular damage Anesthesia (see MAR for exact dosages):    Anesthesia method:  None Laceration details:    Location:  Finger   Finger location:  L thumb   Length (cm):  7   Depth (mm):  2 Repair type:    Repair type:  Simple Pre-procedure details:    Preparation:  Patient was prepped and draped in usual sterile fashion and imaging obtained to evaluate for foreign bodies Exploration:    Hemostasis achieved with:  Direct pressure   Wound exploration: wound explored through full range of motion and entire depth of wound probed and visualized     Wound extent: no foreign bodies/material noted, no muscle damage noted, no nerve damage noted, no tendon damage noted, no underlying fracture noted and no vascular damage noted   Treatment:    Area cleansed with:  Betadine and soap and water   Amount of cleaning:  Standard   Irrigation solution:  Sterile saline   Irrigation method:  Pressure wash Skin repair:    Repair method:  Tissue adhesive Approximation:    Approximation:  Close Post-procedure details:    Dressing:  Sterile dressing   Patient tolerance of procedure:  Tolerated well, no immediate complications   (including critical care time)  Medications Ordered in ED Medications - No data to display  ED Course  I have reviewed the triage vital signs and the nursing notes.  Pertinent labs & imaging results that were available during my care of the patient were reviewed by me and considered in my medical decision making (see chart for details).    MDM Rules/Calculators/A&P                     CAITLIN COMIS is a 33 y.o.  female who presents to ED for laceration of the Left Thumb.  DG Left Thumb:  IMPRESSION:  1. Very mild soft tissue injury at the distal tip of the tuft. No  opaque foreign bodies in the soft tissues.  2. No osseous abnormality.   Wound not gaping, superficial.  Discussed suturing versus Dermabond with the patient, shared decision making made and patient has chosen to proceed with Dermabond closure. Wound thoroughly cleaned in ED today. Wound explored and bottom of wound seen in a bloodless field. Laceration repaired as dictated above. No antibiotics indicated at this time.  Patient reports her last Tdap shot was in 2011.  I advised patient to have update Tdap today and she refused.  Patient reports that she wants to avoid arm soreness during the holiday today.  I discussed risks of tetanus with patient and she stated understanding.  She has requested to be discharged at this time and will call her primary care provider after discharge to schedule follow-up appointment and have Tdap updated at that time.  Patient counseled on home wound care.  Encourage PCP follow-up next week for recheck.. Patient was urged to return to the Emergency Department for worsening pain, swelling, expanding erythema especially if it streaks away from the affected area, fever, or for any additional concerns.  At this time there does not appear to be any evidence of an acute emergency medical condition and the patient appears stable for discharge with appropriate outpatient follow up. Diagnosis was discussed with patient who verbalizes understanding  of care plan and is agreeable to discharge. I have discussed return precautions with patient who verbalizes understanding of return precautions. Patient encouraged to follow-up with their PCP. All questions answered.  Note: Portions of this report may have been transcribed using voice recognition software. Every effort was made to ensure accuracy; however, inadvertent computerized  transcription errors may still be present. Final Clinical Impression(s) / ED Diagnoses Final diagnoses:  Laceration of left thumb without foreign body with damage to nail, initial encounter    Rx / DC Orders ED Discharge Orders    None       Gari Crown 12/31/18 1702    Margette Fast, MD 01/01/19 1221

## 2019-02-28 ENCOUNTER — Emergency Department (HOSPITAL_BASED_OUTPATIENT_CLINIC_OR_DEPARTMENT_OTHER)
Admission: EM | Admit: 2019-02-28 | Discharge: 2019-02-28 | Disposition: A | Discharge status: Home / Self Care | Payer: PRIVATE HEALTH INSURANCE | Attending: Emergency Medicine | Admitting: Emergency Medicine

## 2019-02-28 ENCOUNTER — Encounter (HOSPITAL_BASED_OUTPATIENT_CLINIC_OR_DEPARTMENT_OTHER): Payer: Self-pay

## 2019-02-28 ENCOUNTER — Other Ambulatory Visit: Payer: Self-pay

## 2019-02-28 ENCOUNTER — Other Ambulatory Visit: Payer: Self-pay | Admitting: Obstetrics

## 2019-02-28 DIAGNOSIS — I10 Essential (primary) hypertension: Secondary | ICD-10-CM | POA: Insufficient documentation

## 2019-02-28 DIAGNOSIS — D509 Iron deficiency anemia, unspecified: Secondary | ICD-10-CM | POA: Insufficient documentation

## 2019-02-28 DIAGNOSIS — N939 Abnormal uterine and vaginal bleeding, unspecified: Secondary | ICD-10-CM

## 2019-02-28 DIAGNOSIS — Z79899 Other long term (current) drug therapy: Secondary | ICD-10-CM | POA: Insufficient documentation

## 2019-02-28 DIAGNOSIS — Z87891 Personal history of nicotine dependence: Secondary | ICD-10-CM | POA: Diagnosis not present

## 2019-02-28 LAB — CBC WITH DIFFERENTIAL/PLATELET
Abs Immature Granulocytes: 0 10*3/uL (ref 0.00–0.07)
Basophils Absolute: 0.1 10*3/uL (ref 0.0–0.1)
Basophils Relative: 1 %
Eosinophils Absolute: 0.2 10*3/uL (ref 0.0–0.5)
Eosinophils Relative: 3 %
HCT: 26.5 % — ABNORMAL LOW (ref 36.0–46.0)
Hemoglobin: 7.7 g/dL — ABNORMAL LOW (ref 12.0–15.0)
Immature Granulocytes: 0 %
Lymphocytes Relative: 30 %
Lymphs Abs: 1.4 10*3/uL (ref 0.7–4.0)
MCH: 20.9 pg — ABNORMAL LOW (ref 26.0–34.0)
MCHC: 29.1 g/dL — ABNORMAL LOW (ref 30.0–36.0)
MCV: 71.8 fL — ABNORMAL LOW (ref 80.0–100.0)
Monocytes Absolute: 0.4 10*3/uL (ref 0.1–1.0)
Monocytes Relative: 9 %
Neutro Abs: 2.6 10*3/uL (ref 1.7–7.7)
Neutrophils Relative %: 57 %
Platelets: 322 10*3/uL (ref 150–400)
RBC: 3.69 MIL/uL — ABNORMAL LOW (ref 3.87–5.11)
RDW: 18.3 % — ABNORMAL HIGH (ref 11.5–15.5)
WBC: 4.7 10*3/uL (ref 4.0–10.5)
nRBC: 0 % (ref 0.0–0.2)

## 2019-02-28 LAB — BASIC METABOLIC PANEL
Anion gap: 6 (ref 5–15)
BUN: 7 mg/dL (ref 6–20)
CO2: 24 mmol/L (ref 22–32)
Calcium: 8.7 mg/dL — ABNORMAL LOW (ref 8.9–10.3)
Chloride: 106 mmol/L (ref 98–111)
Creatinine, Ser: 0.67 mg/dL (ref 0.44–1.00)
GFR calc Af Amer: >60 mL/min (ref >60)
GFR calc non Af Amer: >60 mL/min (ref >60)
Glucose, Bld: 100 mg/dL — ABNORMAL HIGH (ref 70–99)
Potassium: 3.8 mmol/L (ref 3.5–5.1)
Sodium: 136 mmol/L (ref 135–145)

## 2019-02-28 LAB — PREGNANCY, URINE: Preg Test, Ur: NEGATIVE

## 2019-02-28 MED ORDER — FERROUS SULFATE 325 (65 FE) MG PO TABS: 325.0000 mg | ORAL_TABLET | Freq: Every day | ORAL | 0 refills | Status: DC

## 2019-02-28 MED ORDER — DOCUSATE SODIUM 100 MG PO CAPS: 100.0000 mg | ORAL_CAPSULE | Freq: Every day | ORAL | 0 refills | Status: AC

## 2019-02-28 MED ORDER — SODIUM CHLORIDE 0.9 % IV SOLN: 510.0000 mg | INTRAVENOUS | Status: AC

## 2019-02-28 MED ORDER — SENNA 8.6 MG PO TABS: 1.0000 | ORAL_TABLET | Freq: Every evening | ORAL | 0 refills | Status: AC | PRN

## 2019-02-28 MED ORDER — POLYETHYLENE GLYCOL 3350 17 GM/SCOOP PO POWD: ORAL | 0 refills | Status: DC

## 2019-02-28 NOTE — ED Provider Notes (Signed)
[facility] EMERGENCY DEPARTMENT Provider Note   CSN: [id number] Arrival date & time: 02/28/19  0701     History Chief Complaint  Patient presents with  . Vaginal Bleeding    Shirley [name] is a 34 y.o. female.  34 year old female with past medical history including hypertension, eczema, iron deficiency anemia who presents with weakness and vaginal bleeding.  Patient notes that yesterday she began feeling weak/lightheaded like she is going to pass out.  She notices it mostly when she stands up.  She also notes that she began having vaginal bleeding 2 days ago which is unusual for her because she already had her menstrual period on 2/6.  She has a prescription for iron but does not take it regularly because it makes her constipated.  She denies any recent illness including no vomiting, diarrhea, or URI symptoms.  She has been eating and drinking normally.  She tried calling the OB/GYN clinic but did not hear back.  Of note, she is on Coreg for blood pressure but did not take her medication this morning.  The history is provided by the patient.  Vaginal Bleeding      Past Medical History:  Diagnosis Date  . Eczema   . Hypertension   . Infection    UTI  . Low serum iron   . Ovarian cyst     There are no problems to display for this patient.   Past Surgical History:  Procedure Laterality Date  . CESAREAN SECTION    . INDUCED ABORTION    . WISDOM TOOTH EXTRACTION       OB History    Gravida  7   Para  3   Term  1   Preterm  2   AB  3   Living  4     SAB  1   TAB  2   Ectopic      Multiple  1   Live Births  1           Family History  Problem Relation Age of Onset  . Cancer Maternal Grandmother        breast  . Healthy Mother   . COPD Father   . Other Neg Hx   . Hearing loss Neg Hx     Social History   Tobacco Use  . Smoking status: Former Smoker    Packs/day: 0.50    Years: 2.00    Pack years: 1.00    Types: Cigarettes  .  Smokeless tobacco: Never Used  . Tobacco comment: quit 2019  Substance Use Topics  . Alcohol use: No  . Drug use: No    Home Medications Prior to Admission medications   Medication Sig Start Date End Date Taking? Authorizing Provider  Carvedilol (COREG PO) Take by mouth.    [provider]  docusate sodium (COLACE) 100 MG capsule Take 1 capsule (100 mg total) by mouth daily. 02/28/19   Graysyn Bache, Wenda Overland, MD  ferrous sulfate (FERROUSUL) 325 (65 FE) MG tablet Take 1 tablet (325 mg total) by mouth 2 (two) times daily. 08/20/18   Jorje Guild, NP  ferrous sulfate 325 (65 FE) MG tablet Take 1 tablet (325 mg total) by mouth daily. 02/28/19   Jenah Vanasten, Wenda Overland, MD  ibuprofen (ADVIL) 800 MG tablet Take 1 tablet (800 mg total) by mouth 3 (three) times daily. 11/01/18   Isla Pence, MD  polyethylene glycol powder (GLYCOLAX/MIRALAX) 17 GM/SCOOP powder Mix 1 capful in drink  and take by mouth one to three times daily as needed for daily soft stools  OTC 02/28/19   Tilda Samudio, Wenda Overland, MD  senna (SENOKOT) 8.6 MG TABS tablet Take 1 tablet (8.6 mg total) by mouth at bedtime as needed for moderate constipation. Until having soft daily stools 02/28/19   Rogenia Werntz, Wenda Overland, MD    Allergies    Patient has no known allergies.  Review of Systems   Review of Systems  Genitourinary: Positive for vaginal bleeding.   All other systems reviewed and are negative except that which was mentioned in HPI  Physical Exam Updated Vital Signs BP (!) 149/99 (BP Location: Right Arm)   Pulse 78   Temp 98.4 F (36.9 C) (Oral)   Resp 20   Ht 5' (1.524 m)   Wt 72.6 kg   LMP 02/26/2019 Comment: recent miscarriage  SpO2 100%   BMI 31.25 kg/m   Physical Exam Vitals and nursing note reviewed.  Constitutional:      General: She is not in acute distress.    Appearance: She is well-developed.  HENT:     Head: Normocephalic and atraumatic.     Mouth/Throat:     Mouth: Mucous membranes are  moist.     Pharynx: Oropharynx is clear.     Comments: Pale tongue Eyes:     Comments: Pale conjunctivae  Cardiovascular:     Rate and Rhythm: Normal rate and regular rhythm.     Heart sounds: Normal heart sounds. No murmur.  Pulmonary:     Effort: Pulmonary effort is normal.     Breath sounds: Normal breath sounds.  Abdominal:     General: Bowel sounds are normal. There is no distension.     Palpations: Abdomen is soft.     Tenderness: There is no abdominal tenderness.  Musculoskeletal:     Cervical back: Neck supple.  Skin:    General: Skin is warm and dry.  Neurological:     Mental Status: She is alert and oriented to person, place, and time.     Comments: Fluent speech  Psychiatric:        Judgment: Judgment normal.     ED Results / Procedures / Treatments   Labs (all labs ordered are listed, but only abnormal results are displayed) Labs Reviewed  BASIC METABOLIC PANEL - Abnormal; Notable for the following components:      Result Value   Glucose, Bld 100 (*)    Calcium 8.7 (*)    All other components within normal limits  CBC WITH DIFFERENTIAL/PLATELET - Abnormal; Notable for the following components:   RBC 3.69 (*)    Hemoglobin 7.7 (*)    HCT 26.5 (*)    MCV 71.8 (*)    MCH 20.9 (*)    MCHC 29.1 (*)    RDW 18.3 (*)    All other components within normal limits  PREGNANCY, URINE    EKG EKG Interpretation  Date/Time:  Monday February 28 2019 08:08:34 EST Ventricular Rate:  81 PR Interval:    QRS Duration: 88 QT Interval:  436 QTC Calculation: 507 R Axis:   82 Text Interpretation: Sinus rhythm Prolonged QT interval QT longer than previous Confirmed by Theotis Burrow [phone]) on 02/28/2019 8:14:16 AM   Radiology No results found.  Procedures Procedures (including critical care time)  Medications Ordered in ED Medications - No data to display  Orthostatic VS for the past 24 hrs:  BP- Lying Pulse- Lying BP- Sitting Pulse- Sitting BP-  Standing at 0  minutes Pulse- Standing at 0 minutes  02/28/19 0924 (!) 158/96 60 (!) 136/97 66 (!) 137/102 84     ED Course  I have reviewed the triage vital signs and the nursing notes.  Pertinent labs  that were available during my care of the patient were reviewed by me and considered in my medical decision making (see chart for details).    MDM Rules/Calculators/A&P                      Alert, hypertensive on exam. Chart review shows Hgb 7.3 last fall. Today Hgb is 7.7, stable from prior. UPT negative and BMP normal.  I have emphasized the importance of OB/GYN follow-up for further work-up of her abnormal uterine bleeding and discussion of medical management.  I have also emphasized the importance of compliance with iron and counseled the patient on constipation treatment with over-the-counter medications.  Encouraged to drink plenty of water and also supplement iron with diet.  I have extensively reviewed return precautions and she voiced understanding. Final Clinical Impression(s) / ED Diagnoses Final diagnoses:  Abnormal vaginal bleeding  Iron deficiency anemia, unspecified iron deficiency anemia type    Rx / DC Orders ED Discharge Orders         Ordered    polyethylene glycol powder (GLYCOLAX/MIRALAX) 17 GM/SCOOP powder     02/28/19 1001    docusate sodium (COLACE) 100 MG capsule  Daily     02/28/19 1001    senna (SENOKOT) 8.6 MG TABS tablet  At bedtime PRN     02/28/19 1001    ferrous sulfate 325 (65 FE) MG tablet  Daily     02/28/19 1001           Torri Michalski, Wenda Overland, MD 02/28/19 1004

## 2019-02-28 NOTE — ED Notes (Signed)
Pt on monitor 

## 2019-02-28 NOTE — ED Notes (Signed)
Pt lying down for 80mins for orthostatic

## 2019-02-28 NOTE — Discharge Instructions (Addendum)
It is very important for you to take iron daily as prescribed while awaiting follow up with OBGYN clinic. You make take colace daily, miralax 1-3 times daily as needed, and senna as needed for constipation related to iron. You may take senna, 1 tablet at night, as needed for severe constipation. Follow up with OBGYN at next available appointment. Drink plenty of water.

## 2019-02-28 NOTE — ED Triage Notes (Signed)
Pt arrives to ED with reports of weakness and feeling like she was going to pass out, states that she had a period around the 6th of the month and restarted this Saturday.

## 2019-02-28 NOTE — ED Notes (Signed)
ED Provider at bedside. 

## 2019-03-01 ENCOUNTER — Other Ambulatory Visit: Payer: Self-pay | Admitting: Obstetrics

## 2019-03-01 ENCOUNTER — Telehealth: Payer: Self-pay

## 2019-03-01 DIAGNOSIS — N939 Abnormal uterine and vaginal bleeding, unspecified: Secondary | ICD-10-CM

## 2019-03-01 DIAGNOSIS — I10 Essential (primary) hypertension: Secondary | ICD-10-CM

## 2019-03-01 DIAGNOSIS — D509 Iron deficiency anemia, unspecified: Secondary | ICD-10-CM

## 2019-03-01 MED ORDER — TRIAMTERENE-HCTZ 37.5-25 MG PO CAPS: 1.0000 | ORAL_CAPSULE | Freq: Every day | ORAL | 11 refills | Status: AC

## 2019-03-01 MED ORDER — CITRANATAL BLOOM 90-1 MG PO TABS: 1.0000 | ORAL_TABLET | Freq: Every day | ORAL | 11 refills | Status: DC

## 2019-03-01 MED ORDER — MEGESTROL ACETATE 40 MG PO TABS: 40.0000 mg | ORAL_TABLET | Freq: Two times a day (BID) | ORAL | 0 refills | Status: DC

## 2019-03-01 MED ORDER — CARVEDILOL 12.5 MG PO TABS: 12.5000 mg | ORAL_TABLET | Freq: Two times a day (BID) | ORAL | 11 refills | Status: DC

## 2019-03-01 NOTE — Telephone Encounter (Signed)
Pt called and reports that Dr. Jodi Mourning was going to send something to her pharmacy to stop her periods. She states the pharmacy hasn't received anything yet.

## 2019-03-02 ENCOUNTER — Telehealth: Payer: Self-pay | Admitting: Obstetrics

## 2019-03-02 NOTE — Telephone Encounter (Signed)
Spoke with patient after she was seen in ER for AUB, and had a hgb of 7.7 grams.  She was also hypertensive.  Discussed the importance of aggressive management at this time because her previous hgb was 7.2 grams, and she is symptomatic. She was last seen in the office in 2019.  A/P: 1. Severe anemia.  Noncompliance with healthcare.  Iron and vitamins Rx, and the importance of follow up in the office was stressed.                                    2.  Hypertension.  Coreg / HCTZ Rx, and she was referred to Internal Medicine for follow up.  Shelly Bombard, MD 03/02/2019 8:39 AM

## 2019-03-03 ENCOUNTER — Telehealth: Payer: Self-pay

## 2019-03-03 NOTE — Telephone Encounter (Signed)
Pt would like to know if she can get a note saying she can be out of work for a couple days next week to see if the iron infusion will help her feel better. She has appt for iron infusion tomorrow at 1. 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

## 2019-03-03 NOTE — Telephone Encounter (Signed)
OK for note to be out of work for a couple of days next week, after iron transfusion tomorrow.

## 2019-03-03 NOTE — Telephone Encounter (Signed)
Megace was Rx

## 2019-03-03 NOTE — Telephone Encounter (Signed)
Pt LVM requesting call back, called pt back and no answer. LVM for pt to call back.

## 2019-03-04 ENCOUNTER — Other Ambulatory Visit (HOSPITAL_COMMUNITY): Payer: Self-pay | Admitting: *Deleted

## 2019-03-04 ENCOUNTER — Ambulatory Visit (HOSPITAL_COMMUNITY)
Admission: RE | Admit: 2019-03-04 | Discharge: 2019-03-04 | Disposition: A | Discharge status: Home / Self Care | Payer: PRIVATE HEALTH INSURANCE | Source: Ambulatory Visit | Attending: Obstetrics | Admitting: Obstetrics

## 2019-03-04 ENCOUNTER — Other Ambulatory Visit: Payer: Self-pay

## 2019-03-04 DIAGNOSIS — D500 Iron deficiency anemia secondary to blood loss (chronic): Secondary | ICD-10-CM | POA: Diagnosis not present

## 2019-03-04 MED ORDER — SODIUM CHLORIDE 0.9 % IV SOLN
510.0000 mg | INTRAVENOUS | Status: DC
  Administered 2019-03-04: 510 mg via INTRAVENOUS
  Filled 2019-03-04: qty 17

## 2019-03-04 NOTE — Progress Notes (Signed)
Note for work printed for pt per Dr. Jodi Mourning.

## 2019-03-04 NOTE — Discharge Instructions (Signed)

## 2019-03-11 ENCOUNTER — Inpatient Hospital Stay (HOSPITAL_COMMUNITY): Admission: RE | Admit: 2019-03-11 | Payer: Medicaid Other | Source: Ambulatory Visit

## 2019-03-25 ENCOUNTER — Inpatient Hospital Stay (HOSPITAL_COMMUNITY): Admission: RE | Admit: 2019-03-25 | Payer: Medicaid Other | Source: Ambulatory Visit

## 2019-03-28 ENCOUNTER — Ambulatory Visit: Payer: Medicaid Other | Admitting: Obstetrics

## 2019-06-27 ENCOUNTER — Ambulatory Visit: Payer: PRIVATE HEALTH INSURANCE | Admitting: Obstetrics

## 2019-07-05 ENCOUNTER — Ambulatory Visit: Payer: PRIVATE HEALTH INSURANCE | Admitting: Obstetrics

## 2019-07-15 ENCOUNTER — Ambulatory Visit: Payer: PRIVATE HEALTH INSURANCE | Admitting: Obstetrics

## 2019-07-28 ENCOUNTER — Ambulatory Visit (INDEPENDENT_AMBULATORY_CARE_PROVIDER_SITE_OTHER): Payer: PRIVATE HEALTH INSURANCE | Admitting: Obstetrics

## 2019-07-28 ENCOUNTER — Other Ambulatory Visit: Payer: Self-pay

## 2019-07-28 ENCOUNTER — Encounter: Payer: Self-pay | Admitting: Obstetrics

## 2019-07-28 ENCOUNTER — Other Ambulatory Visit (HOSPITAL_COMMUNITY)
Admission: RE | Admit: 2019-07-28 | Discharge: 2019-07-28 | Disposition: A | Discharge status: Home / Self Care | Payer: PRIVATE HEALTH INSURANCE | Source: Ambulatory Visit | Attending: Obstetrics | Admitting: Obstetrics

## 2019-07-28 VITALS — BP 161/99 | HR 71 | Ht 61.0 in | Wt 173.5 lb

## 2019-07-28 DIAGNOSIS — N898 Other specified noninflammatory disorders of vagina: Secondary | ICD-10-CM | POA: Diagnosis present

## 2019-07-28 DIAGNOSIS — Z3202 Encounter for pregnancy test, result negative: Secondary | ICD-10-CM

## 2019-07-28 DIAGNOSIS — Z01419 Encounter for gynecological examination (general) (routine) without abnormal findings: Secondary | ICD-10-CM | POA: Diagnosis present

## 2019-07-28 DIAGNOSIS — N944 Primary dysmenorrhea: Secondary | ICD-10-CM

## 2019-07-28 DIAGNOSIS — N939 Abnormal uterine and vaginal bleeding, unspecified: Secondary | ICD-10-CM

## 2019-07-28 DIAGNOSIS — D500 Iron deficiency anemia secondary to blood loss (chronic): Secondary | ICD-10-CM | POA: Diagnosis not present

## 2019-07-28 DIAGNOSIS — I10 Essential (primary) hypertension: Secondary | ICD-10-CM

## 2019-07-28 LAB — POCT URINE PREGNANCY: Preg Test, Ur: NEGATIVE

## 2019-07-28 MED ORDER — IBUPROFEN 800 MG PO TABS: 800.0000 mg | ORAL_TABLET | Freq: Three times a day (TID) | ORAL | 5 refills | Status: DC | PRN

## 2019-07-28 MED ORDER — NORETHINDRONE ACETATE 5 MG PO TABS: 5.0000 mg | ORAL_TABLET | Freq: Every day | ORAL | 5 refills | Status: DC

## 2019-07-28 MED ORDER — CARVEDILOL 25 MG PO TABS: 25.0000 mg | ORAL_TABLET | Freq: Two times a day (BID) | ORAL | 11 refills | Status: DC

## 2019-07-28 NOTE — Progress Notes (Signed)
Patient presents for Annual Exam   LMP: 07/15/19  Pt states cycle is still on this is not normal for pt.  Last pap: 04/02/2017: WNL  Pt took Megace today to try stop bleeding.   STD Screening: Desires Swab only  Contraception : None  No Family Hx of Breast Cancer

## 2019-07-29 ENCOUNTER — Encounter: Payer: Self-pay | Admitting: Obstetrics

## 2019-07-29 LAB — CBC
Hematocrit: 34 % (ref 34.0–46.6)
Hemoglobin: 11.3 g/dL (ref 11.1–15.9)
MCH: 31 pg (ref 26.6–33.0)
MCHC: 33.2 g/dL (ref 31.5–35.7)
MCV: 93 fL (ref 79–97)
Platelets: 297 10*3/uL (ref 150–450)
RBC: 3.64 x10E6/uL — ABNORMAL LOW (ref 3.77–5.28)
RDW: 12.6 % (ref 11.7–15.4)
WBC: 6.1 10*3/uL (ref 3.4–10.8)

## 2019-07-29 LAB — CERVICOVAGINAL ANCILLARY ONLY
Bacterial Vaginitis (gardnerella): POSITIVE — AB
Candida Glabrata: NEGATIVE
Candida Vaginitis: POSITIVE — AB
Chlamydia: NEGATIVE
Comment: NEGATIVE
Comment: NEGATIVE
Comment: NEGATIVE
Comment: NEGATIVE
Comment: NEGATIVE
Comment: NORMAL
Neisseria Gonorrhea: NEGATIVE
Trichomonas: NEGATIVE

## 2019-07-29 LAB — CYTOLOGY - PAP
Comment: NEGATIVE
Diagnosis: NEGATIVE
High risk HPV: NEGATIVE

## 2019-07-29 LAB — FERRITIN: Ferritin: 10 ng/mL — ABNORMAL LOW (ref 15–150)

## 2019-07-29 NOTE — Progress Notes (Signed)
Subjective:        ILYSSA [name] is a 34 y.o. female here for a routine exam.  Current complaints: Irregular periods.    Personal health questionnaire:  Is patient Shirley [name], have a family history of breast and/or ovarian cancer: no Is there a family history of uterine cancer diagnosed at age < 70, gastrointestinal cancer, urinary tract cancer, family member who is a Shirley [name]: no Is the patient overweight and hypertensive, family history of diabetes, personal history of gestational diabetes, preeclampsia or PCOS: no Is patient over 27, have PCOS,  family history of premature CHD under age 58, diabetes, smoke, have hypertension or peripheral artery disease:  no At any time, has a partner hit, kicked or otherwise hurt or frightened you?: no Over the past 2 weeks, have you felt down, depressed or hopeless?: no Over the past 2 weeks, have you felt little interest or pleasure in doing things?:no   Gynecologic History Patient's last menstrual period was 07/15/2019 (exact date). Contraception: none Last Pap: 04-03-2018. Results were: normal Last mammogram: n/a. Results were: n/a  Obstetric History OB History  Gravida Para Term Preterm AB Living  7 3 1 2 3 4   SAB TAB Ectopic Multiple Live Births  1 2   1 1     # Outcome Date GA Lbr Len/2nd Weight Sex Delivery Anes PTL Lv  7 Gravida           6 SAB 2013             Birth Comments: System Generated. Please review and update pregnancy details.  5 TAB 2012          4 TAB 2011          3 Preterm 06/03/06 [redacted]w[redacted]d    CS-LTranv  Y      Birth Comments: twins  2 Term 07/01/03    M Vag-Spont  N LIV  1 Preterm 02/25/02 [redacted]w[redacted]d   M CS-LTranv        Birth Comments: NR NST    Past Medical History:  Diagnosis Date  . Eczema   . Hypertension   . Infection    UTI  . Low serum iron   . Ovarian cyst     Past Surgical History:  Procedure Laterality Date  . CESAREAN SECTION    . INDUCED ABORTION    . WISDOM  TOOTH EXTRACTION       Current Outpatient Medications:  .  docusate sodium (COLACE) 100 MG capsule, Take 1 capsule (100 mg total) by mouth daily., Disp: 30 capsule, Rfl: 0 .  ferrous sulfate (FERROUSUL) 325 (65 FE) MG tablet, Take 1 tablet (325 mg total) by mouth 2 (two) times daily., Disp: 60 tablet, Rfl: 0 .  ferrous sulfate 325 (65 FE) MG tablet, Take 1 tablet (325 mg total) by mouth daily., Disp: 30 tablet, Rfl: 0 .  ibuprofen (ADVIL) 800 MG tablet, Take 1 tablet (800 mg total) by mouth 3 (three) times daily., Disp: 21 tablet, Rfl: 0 .  polyethylene glycol powder (GLYCOLAX/MIRALAX) 17 GM/SCOOP powder, Mix 1 capful in drink and take by mouth one to three times daily as needed for daily soft stools  OTC, Disp: 116 g, Rfl: 0 .  senna (SENOKOT) 8.6 MG TABS tablet, Take 1 tablet (8.6 mg total) by mouth at bedtime as needed for moderate constipation. Until having soft daily stools, Disp: 10 tablet, Rfl: 0 .  carvedilol (COREG) 25 MG tablet, Take 1 tablet (25 mg total)  by mouth 2 (two) times daily with a meal., Disp: 60 tablet, Rfl: 11 .  ibuprofen (ADVIL) 800 MG tablet, Take 1 tablet (800 mg total) by mouth every 8 (eight) hours as needed., Disp: 30 tablet, Rfl: 5 .  norethindrone (AYGESTIN) 5 MG tablet, Take 1 tablet (5 mg total) by mouth daily., Disp: 30 tablet, Rfl: 5 .  Prenatal-DSS-FeCb-FeGl-FA (CITRANATAL BLOOM) 90-1 MG TABS, Take 1 tablet by mouth daily before breakfast. (Patient not taking: Reported on 07/28/2019), Disp: 30 tablet, Rfl: 11 .  triamterene-hydrochlorothiazide (DYAZIDE) 37.5-25 MG capsule, Take 1 each (1 capsule total) by mouth daily before breakfast. (Patient not taking: Reported on 07/28/2019), Disp: 30 capsule, Rfl: 11 No Known Allergies  Social History   Tobacco Use  . Smoking status: Former Smoker    Packs/day: 0.50    Years: 2.00    Pack years: 1.00    Types: Cigarettes  . Smokeless tobacco: Never Used  . Tobacco comment: quit 2019  Substance Use Topics  . Alcohol  use: No    Family History  Problem Relation Age of Onset  . Cancer Maternal Grandmother        breast  . Healthy Mother   . COPD Father   . Other Neg Hx   . Hearing loss Neg Hx       Review of Systems  Constitutional: negative for fatigue and weight loss Respiratory: negative for cough and wheezing Cardiovascular: negative for chest pain, fatigue and palpitations Gastrointestinal: negative for abdominal pain and change in bowel habits Musculoskeletal:negative for myalgias Neurological: negative for gait problems and tremors Behavioral/Psych: negative for abusive relationship, depression Endocrine: negative for temperature intolerance    Genitourinary:positive for abnormal menstrual periods.  Negative for genital lesions, hot flashes, sexual problems and vaginal discharge Integument/breast: negative for breast lump, breast tenderness, nipple discharge and skin lesion(s)    Objective:       BP (!) 161/99   Pulse 71   Ht 5\' 1"  (1.549 m)   Wt 173 lb 8 oz (78.7 kg)   LMP 07/15/2019 (Exact Date)   BMI 32.78 kg/m  General:   alert and no distress  Skin:   no rash or abnormalities  Lungs:   clear to auscultation bilaterally  Heart:   regular rate and rhythm, S1, S2 normal, no murmur, click, rub or gallop  Breasts:   normal without suspicious masses, skin or nipple changes or axillary nodes  Abdomen:  normal findings: no organomegaly, soft, non-tender and no hernia  Pelvis:  External genitalia: normal general appearance Urinary system: urethral meatus normal and bladder without fullness, nontender Vaginal: normal without tenderness, induration or masses Cervix: normal appearance Adnexa: normal bimanual exam Uterus: anteverted and non-tender, normal size   Lab Review Urine pregnancy test Labs reviewed yes Radiologic studies reviewed no  50% of 25 min visit spent on counseling and coordination of care.   Assessment:     1. Encounter for gynecological examination with  Papanicolaou smear of cervix Rx: - Cytology - PAP( [place])  2. Abnormal uterine bleeding (AUB) Rx: - CBC - norethindrone (AYGESTIN) 5 MG tablet; Take 1 tablet (5 mg total) by mouth daily.  Dispense: 30 tablet; Refill: 5 - POCT urine pregnancy - considering Endometrial Ablation.  Educational material dispensed  3. Iron deficiency anemia due to chronic blood loss Rx: - Ferritin  4. Vaginal discharge Rx: - Cervicovaginal ancillary only( [place])  5. HTN (hypertension), benign Rx: - Ambulatory referral to Internal Medicine - carvedilol (COREG) 25  MG tablet; Take 1 tablet (25 mg total) by mouth 2 (two) times daily with a meal.  Dispense: 60 tablet; Refill: 11  6. Primary dysmenorrhea Rx; - ibuprofen (ADVIL) 800 MG tablet; Take 1 tablet (800 mg total) by mouth every 8 (eight) hours as needed.  Dispense: 30 tablet; Refill: 5    Plan:    Education reviewed: calcium supplements, depression evaluation, low fat, low cholesterol diet, safe sex/STD prevention, self breast exams and weight bearing exercise. Contraception: none. Follow up in: 6 weeks.   Meds ordered this encounter  Medications  . carvedilol (COREG) 25 MG tablet    Sig: Take 1 tablet (25 mg total) by mouth 2 (two) times daily with a meal.    Dispense:  60 tablet    Refill:  11  . norethindrone (AYGESTIN) 5 MG tablet    Sig: Take 1 tablet (5 mg total) by mouth daily.    Dispense:  30 tablet    Refill:  5  . ibuprofen (ADVIL) 800 MG tablet    Sig: Take 1 tablet (800 mg total) by mouth every 8 (eight) hours as needed.    Dispense:  30 tablet    Refill:  5   Orders Placed This Encounter  Procedures  . CBC  . Ferritin  . Ambulatory referral to Internal Medicine    Referral Priority:   Routine    Referral Type:   Consultation    Referral Reason:   Specialty Services Required    Requested Specialty:   Internal Medicine    Number of Visits Requested:   1  . POCT urine pregnancy    Shelly Bombard,  MD 07/29/2019 10:37 AM

## 2019-08-02 ENCOUNTER — Other Ambulatory Visit: Payer: Self-pay | Admitting: Obstetrics

## 2019-08-02 DIAGNOSIS — N760 Acute vaginitis: Secondary | ICD-10-CM

## 2019-08-02 DIAGNOSIS — B373 Candidiasis of vulva and vagina: Secondary | ICD-10-CM

## 2019-08-02 DIAGNOSIS — B9689 Other specified bacterial agents as the cause of diseases classified elsewhere: Secondary | ICD-10-CM

## 2019-08-02 DIAGNOSIS — B3731 Acute candidiasis of vulva and vagina: Secondary | ICD-10-CM

## 2019-08-02 MED ORDER — METRONIDAZOLE 500 MG PO TABS: 500.0000 mg | ORAL_TABLET | Freq: Two times a day (BID) | ORAL | 2 refills | Status: DC

## 2019-08-02 MED ORDER — FLUCONAZOLE 150 MG PO TABS: 150.0000 mg | ORAL_TABLET | Freq: Once | ORAL | 0 refills | Status: AC

## 2019-08-04 ENCOUNTER — Other Ambulatory Visit: Payer: Self-pay | Admitting: Obstetrics

## 2019-08-04 DIAGNOSIS — N939 Abnormal uterine and vaginal bleeding, unspecified: Secondary | ICD-10-CM

## 2019-08-09 ENCOUNTER — Emergency Department (HOSPITAL_BASED_OUTPATIENT_CLINIC_OR_DEPARTMENT_OTHER)
Admission: EM | Admit: 2019-08-09 | Discharge: 2019-08-09 | Disposition: A | Discharge status: Home / Self Care | Payer: PRIVATE HEALTH INSURANCE | Attending: Emergency Medicine | Admitting: Emergency Medicine

## 2019-08-09 ENCOUNTER — Encounter (HOSPITAL_BASED_OUTPATIENT_CLINIC_OR_DEPARTMENT_OTHER): Payer: Self-pay | Admitting: Emergency Medicine

## 2019-08-09 ENCOUNTER — Other Ambulatory Visit: Payer: Self-pay

## 2019-08-09 ENCOUNTER — Emergency Department (HOSPITAL_BASED_OUTPATIENT_CLINIC_OR_DEPARTMENT_OTHER): Payer: PRIVATE HEALTH INSURANCE

## 2019-08-09 DIAGNOSIS — R197 Diarrhea, unspecified: Secondary | ICD-10-CM | POA: Diagnosis not present

## 2019-08-09 DIAGNOSIS — I10 Essential (primary) hypertension: Secondary | ICD-10-CM | POA: Insufficient documentation

## 2019-08-09 DIAGNOSIS — F17210 Nicotine dependence, cigarettes, uncomplicated: Secondary | ICD-10-CM | POA: Insufficient documentation

## 2019-08-09 DIAGNOSIS — N939 Abnormal uterine and vaginal bleeding, unspecified: Secondary | ICD-10-CM | POA: Diagnosis not present

## 2019-08-09 DIAGNOSIS — R110 Nausea: Secondary | ICD-10-CM | POA: Diagnosis not present

## 2019-08-09 DIAGNOSIS — R1030 Lower abdominal pain, unspecified: Secondary | ICD-10-CM | POA: Diagnosis not present

## 2019-08-09 LAB — URINALYSIS, MICROSCOPIC (REFLEX): RBC / HPF: >50 RBC/hpf (ref 0–5)

## 2019-08-09 LAB — URINALYSIS, ROUTINE W REFLEX MICROSCOPIC
Bilirubin Urine: NEGATIVE
Glucose, UA: NEGATIVE mg/dL
Ketones, ur: NEGATIVE mg/dL
Leukocytes,Ua: NEGATIVE
Nitrite: NEGATIVE
Protein, ur: NEGATIVE mg/dL
Specific Gravity, Urine: 1.015 (ref 1.005–1.030)
pH: 7 (ref 5.0–8.0)

## 2019-08-09 LAB — PREGNANCY, URINE: Preg Test, Ur: NEGATIVE

## 2019-08-09 MED ORDER — DICYCLOMINE HCL 20 MG PO TABS: 20.0000 mg | ORAL_TABLET | Freq: Two times a day (BID) | ORAL | 0 refills | Status: DC | PRN

## 2019-08-09 MED ORDER — NAPROXEN 500 MG PO TABS: 500.0000 mg | ORAL_TABLET | Freq: Two times a day (BID) | ORAL | 0 refills | Status: DC

## 2019-08-09 MED ORDER — KETOROLAC TROMETHAMINE 60 MG/2ML IM SOLN
60.0000 mg | Freq: Once | INTRAMUSCULAR | Status: AC
  Administered 2019-08-09: 60 mg via INTRAMUSCULAR
  Filled 2019-08-09: qty 2

## 2019-08-09 MED FILL — DICYCLOMINE 20 MG TABLET: 20 | 10 days supply | Qty: 20 | Fill #0

## 2019-08-09 MED FILL — NAPROXEN 500 MG TABS: 500 | 15 days supply | Qty: 30 | Fill #0

## 2019-08-09 NOTE — ED Notes (Addendum)
Patient ambulated to US

## 2019-08-09 NOTE — ED Notes (Signed)
ED Provider at bedside. 

## 2019-08-09 NOTE — ED Triage Notes (Signed)
Pt arrives POV c/o LLQ pain and cramping x 1 week. Pt endorses vaginal bleeding x 2 months, treatment with aygestin  for bleeding but reports bleeding continues. Pt reports ibuprofen 800 mg at 0700 today with no relief

## 2019-08-09 NOTE — ED Notes (Signed)
Pt ambulatory with steady gait to restroom for urine specimen 

## 2019-08-09 NOTE — ED Provider Notes (Signed)
[facility] Provider Note   CSN: [id number] Arrival date & time: 08/09/19  0630     History Chief Complaint  Patient presents with  . Abdominal Pain    Shirley [name] is a 34 y.o. female.  HPI      Presents with abdominal pain and abnormal uterine bleeding Saw OBGYN, was given norethindrone, considering endometrial ablation Abnormal periods began in February, helped for a little bit to control the bleeding, then it worsned.  Has been bleeding for about one month, norethindrone is not helping, soaks approx 5 pads a day, not super heavy but having more continuous bleeding but now the pain is severe  Didn't have the pain like this before, just had normal period cramps, now feels like contractions  1 week of abdominal pain, worsening over the last few days, trying ibuprofen every 6-8hr without relief began on Sunday, 800mg   Cramps lower abdomen with radiation to the back, sometimes feels like burning feeling around entire abdomen, and pains like contractions. Across lower back.  Diarrhea for last 3-4 days,  No black or bloody stools Nausea, no vomiting No vaginal discharge, dysuria. Checked for GC/CHl in office No fevers  Past Medical History:  Diagnosis Date  . Eczema   . Hypertension   . Infection    UTI  . Low serum iron   . Ovarian cyst     There are no problems to display for this patient.   Past Surgical History:  Procedure Laterality Date  . CESAREAN SECTION    . INDUCED ABORTION    . WISDOM TOOTH EXTRACTION       OB History    Gravida  7   Para  3   Term  1   Preterm  2   AB  3   Living  4     SAB  1   TAB  2   Ectopic      Multiple  1   Live Births  1           Family History  Problem Relation Age of Onset  . Cancer Maternal Grandmother        breast  . Healthy Mother   . COPD Father   . Other Neg Hx   . Hearing loss Neg Hx     Social History   Tobacco Use  . Smoking status: Current  Every Day Smoker    Packs/day: 0.50    Years: 2.00    Pack years: 1.00    Types: Cigarettes  . Smokeless tobacco: Never Used  . Tobacco comment: quit 2019  Vaping Use  . Vaping Use: Never used  Substance Use Topics  . Alcohol use: No  . Drug use: No    Home Medications Prior to Admission medications   Medication Sig Start Date End Date Taking? Authorizing Provider  carvedilol (COREG) 25 MG tablet Take 1 tablet (25 mg total) by mouth 2 (two) times daily with a meal. 07/28/19   Shelly Bombard, MD  dicyclomine (BENTYL) 20 MG tablet Take 1 tablet (20 mg total) by mouth 2 (two) times daily as needed for spasms (abdominal pain). 08/09/19   Gareth Morgan, MD  docusate sodium (COLACE) 100 MG capsule Take 1 capsule (100 mg total) by mouth daily. 02/28/19   Little, Wenda Overland, MD  ferrous sulfate (FERROUSUL) 325 (65 FE) MG tablet Take 1 tablet (325 mg total) by mouth 2 (two) times daily. 08/20/18   Jorje Guild, NP  ferrous sulfate 325 (65 FE) MG tablet Take 1 tablet (325 mg total) by mouth daily. 02/28/19   Little, Wenda Overland, MD  ibuprofen (ADVIL) 800 MG tablet Take 1 tablet (800 mg total) by mouth 3 (three) times daily. 11/01/18   Isla Pence, MD  ibuprofen (ADVIL) 800 MG tablet Take 1 tablet (800 mg total) by mouth every 8 (eight) hours as needed. 07/28/19   Shelly Bombard, MD  metroNIDAZOLE (FLAGYL) 500 MG tablet Take 1 tablet (500 mg total) by mouth 2 (two) times daily. 08/02/19   Shelly Bombard, MD  naproxen (NAPROSYN) 500 MG tablet Take 1 tablet (500 mg total) by mouth 2 (two) times daily. 08/09/19   Gareth Morgan, MD  norethindrone (AYGESTIN) 5 MG tablet Take 1 tablet (5 mg total) by mouth daily. 07/28/19   Shelly Bombard, MD  polyethylene glycol powder [facility]) 17 GM/SCOOP powder Mix 1 capful in drink and take by mouth one to three times daily as needed for daily soft stools  OTC 02/28/19   Little, Wenda Overland, MD  Prenatal-DSS-FeCb-FeGl-FA [facility]  BLOOM) 90-1 MG TABS Take 1 tablet by mouth daily before breakfast. Patient not taking: Reported on 07/28/2019 03/01/19   Shelly Bombard, MD  senna (SENOKOT) 8.6 MG TABS tablet Take 1 tablet (8.6 mg total) by mouth at bedtime as needed for moderate constipation. Until having soft daily stools 02/28/19   Little, Wenda Overland, MD  triamterene-hydrochlorothiazide (DYAZIDE) 37.5-25 MG capsule Take 1 each (1 capsule total) by mouth daily before breakfast. Patient not taking: Reported on 07/28/2019 03/01/19   Shelly Bombard, MD    Allergies    Patient has no known allergies.  Review of Systems   Review of Systems  Constitutional: Negative for fever.  HENT: Negative for sore throat.   Eyes: Negative for visual disturbance.  Respiratory: Negative for cough and shortness of breath.   Cardiovascular: Negative for chest pain.  Gastrointestinal: Positive for diarrhea and nausea. Negative for abdominal pain and vomiting.  Genitourinary: Positive for menstrual problem and vaginal bleeding. Negative for difficulty urinating, dysuria and vaginal discharge.  Musculoskeletal: Negative for back pain and neck pain.  Skin: Negative for rash.  Neurological: Negative for syncope and headaches.    Physical Exam Updated Vital Signs BP (!) 166/86 (BP Location: Right Arm)   Pulse 64   Temp 99.1 F (37.3 C) (Oral)   Resp 16   Ht 5\' 1"  (1.549 m)   Wt 78.9 kg   LMP 07/15/2019 (Exact Date)   SpO2 100%   BMI 32.88 kg/m   Physical Exam Vitals and nursing note reviewed.  Constitutional:      General: She is not in acute distress.    Appearance: She is well-developed. She is not diaphoretic.  HENT:     Head: Normocephalic and atraumatic.  Eyes:     Conjunctiva/sclera: Conjunctivae normal.  Cardiovascular:     Rate and Rhythm: Normal rate and regular rhythm.     Heart sounds: Normal heart sounds. No murmur heard.  No friction rub. No gallop.   Pulmonary:     Effort: Pulmonary effort is normal. No  respiratory distress.     Breath sounds: Normal breath sounds. No wheezing or rales.  Abdominal:     General: There is no distension.     Palpations: Abdomen is soft.     Tenderness: There is abdominal tenderness (lower abdomen). There is no guarding. Negative signs include Murphy's sign and McBurney's sign.  Musculoskeletal:  General: No tenderness.     Cervical back: Normal range of motion.  Skin:    General: Skin is warm and dry.     Findings: No erythema or rash.  Neurological:     Mental Status: She is alert and oriented to person, place, and time.     ED Results / Procedures / Treatments   Labs (all labs ordered are listed, but only abnormal results are displayed) Labs Reviewed  URINALYSIS, ROUTINE W REFLEX MICROSCOPIC - Abnormal; Notable for the following components:      Result Value   APPearance CLOUDY (*)    Hgb urine dipstick LARGE (*)    All other components within normal limits  URINALYSIS, MICROSCOPIC (REFLEX) - Abnormal; Notable for the following components:   Bacteria, UA RARE (*)    All other components within normal limits  PREGNANCY, URINE    EKG None  Radiology US PELVIC COMPLETE W TRANSVAGINAL AND TORSION R/O  Result Date: 08/09/2019 CLINICAL DATA:  Abnormal uterine bleeding. EXAM: TRANSABDOMINAL AND TRANSVAGINAL ULTRASOUND OF PELVIS DOPPLER ULTRASOUND OF OVARIES TECHNIQUE: Both transabdominal and transvaginal ultrasound examinations of the pelvis were performed. Transabdominal technique was performed for global imaging of the pelvis including uterus, ovaries, adnexal regions, and pelvic cul-de-sac. It was necessary to proceed with endovaginal exam following the transabdominal exam to visualize the endometrium and ovaries. Color and duplex Doppler ultrasound was utilized to evaluate blood flow to the ovaries. COMPARISON:  August 29, 2016. FINDINGS: Uterus Measurements: 10.5 x 6.0 x 5.1 cm = volume: 165 mL. Fibroid measuring 1.9 x 1.6 x 1.0 cm is noted  anteriorly. Endometrium Thickness: 9 mm which is within normal limits for a patient of reproductive age. No focal abnormality visualized. Right ovary Not visualized.  No adnexal abnormality is noted. Left ovary Measurements: 4.4 x 4.2 x 3.4 cm = volume: 32 mL. 3.1 cm simple follicular cyst is noted. Pulsed Doppler evaluation of left ovary demonstrates normal low-resistance arterial and venous waveforms. Other findings No abnormal free fluid. IMPRESSION: Small uterine fibroid is noted. Right ovary is not visualized. Left ovary is unremarkable. There is no evidence of ovarian mass or torsion. Endometrial thickness is within normal limits. If bleeding remains unresponsive to hormonal or medical therapy, sonohysterogram should be considered for focal lesion work-up. (Ref: Radiological Reasoning: Algorithmic Workup of Abnormal Vaginal Bleeding with Endovaginal Sonography and Sonohysterography. AJR 2008; 371:G62-69) Electronically Signed   By: Marijo Conception M.D.   On: 08/09/2019 10:58    Procedures Procedures (including critical care time)  Medications Ordered in ED Medications  ketorolac (TORADOL) injection 60 mg (60 mg Intramuscular Given 08/09/19 0913)    ED Course  I have reviewed the triage vital signs and the nursing notes.  Pertinent labs & imaging results that were available during my care of the patient were reviewed by me and considered in my medical decision making (see chart for details).    MDM Rules/Calculators/A&P                          34yo female presents with concern for abdominal pain, abnormal vaginal bleeding.  Exam not consistent with appendicitis or diverticulitis.  Consider gastroenteritis as she has had diarrhea over last few days.  Had recent hgb done with OB as well as pelvic exam and declines repeat pelvic.  TVUS done to evaluate for ?torsion and shows small fibroid, no other significant abnormalities. Pain improved with toradol. WIll give rx for naproxen, bentyl as  diarrhea/cramping may contribute to some pain and recommend continued follow up with OBGYN.   Final Clinical Impression(s) / ED Diagnoses Final diagnoses:  Lower abdominal pain  Abnormal uterine bleeding    Rx / DC Orders ED Discharge Orders         Ordered    naproxen (NAPROSYN) 500 MG tablet  2 times daily     Discontinue  Reprint     08/09/19 1113    dicyclomine (BENTYL) 20 MG tablet  2 times daily PRN     Discontinue  Reprint     08/09/19 1113           Gareth Morgan, MD 08/09/19 2151

## 2019-08-11 ENCOUNTER — Inpatient Hospital Stay: Admission: RE | Admit: 2019-08-11 | Payer: PRIVATE HEALTH INSURANCE | Source: Ambulatory Visit

## 2019-08-15 ENCOUNTER — Ambulatory Visit (INDEPENDENT_AMBULATORY_CARE_PROVIDER_SITE_OTHER): Payer: PRIVATE HEALTH INSURANCE | Admitting: Obstetrics & Gynecology

## 2019-08-15 ENCOUNTER — Other Ambulatory Visit: Payer: Self-pay

## 2019-08-15 ENCOUNTER — Encounter: Payer: Self-pay | Admitting: Obstetrics & Gynecology

## 2019-08-15 VITALS — BP 168/105 | HR 75 | Ht 61.0 in | Wt 168.5 lb

## 2019-08-15 DIAGNOSIS — N939 Abnormal uterine and vaginal bleeding, unspecified: Secondary | ICD-10-CM | POA: Insufficient documentation

## 2019-08-15 DIAGNOSIS — D251 Intramural leiomyoma of uterus: Secondary | ICD-10-CM | POA: Diagnosis not present

## 2019-08-15 MED ORDER — NORETHINDRONE ACETATE 5 MG PO TABS: ORAL_TABLET | ORAL | 5 refills | Status: DC

## 2019-08-15 MED FILL — NORETHINDRONE 5 MG TABLET: 5 | 23 days supply | Qty: 30 | Fill #0

## 2019-08-15 NOTE — Progress Notes (Signed)
Pt. Is here for constant cycles that has been going on since Feb. 2021. Pt. States some days are very light and other days pt goes through 6 or 7 pads daily. Pt. States her cycles feel like "contractions".

## 2019-08-15 NOTE — Patient Instructions (Signed)
Levonorgestrel intrauterine device (IUD) What is this medicine? LEVONORGESTREL IUD (LEE voe nor jes trel) is a contraceptive (birth control) device. The device is placed inside the uterus by a healthcare professional. It is used to prevent pregnancy. This device can also be used to treat heavy bleeding that occurs during your period. This medicine may be used for other purposes; ask your health care provider or pharmacist if you have questions. COMMON BRAND NAME(S): Kyleena, LILETTA, Mirena, Skyla What should I tell my health care provider before I take this medicine? They need to know if you have any of these conditions:  abnormal Pap smear  cancer of the breast, uterus, or cervix  diabetes  endometritis  genital or pelvic infection now or in the past  have more than one sexual partner or your partner has more than one partner  heart disease  history of an ectopic or tubal pregnancy  immune system problems  IUD in place  liver disease or tumor  problems with blood clots or take blood-thinners  seizures  use intravenous drugs  uterus of unusual shape  vaginal bleeding that has not been explained  an unusual or allergic reaction to levonorgestrel, other hormones, silicone, or polyethylene, medicines, foods, dyes, or preservatives  pregnant or trying to get pregnant  breast-feeding How should I use this medicine? This device is placed inside the uterus by a health care professional. Talk to your pediatrician regarding the use of this medicine in children. Special care may be needed. Overdosage: If you think you have taken too much of this medicine contact a poison control center or emergency room at once. NOTE: This medicine is only for you. Do not share this medicine with others. What if I miss a dose? This does not apply. Depending on the brand of device you have inserted, the device will need to be replaced every 3 to 6 years if you wish to continue using this type  of birth control. What may interact with this medicine? Do not take this medicine with any of the following medications:  amprenavir  bosentan  fosamprenavir This medicine may also interact with the following medications:  aprepitant  armodafinil  barbiturate medicines for inducing sleep or treating seizures  bexarotene  boceprevir  griseofulvin  medicines to treat seizures like carbamazepine, ethotoin, felbamate, oxcarbazepine, phenytoin, topiramate  modafinil  pioglitazone  rifabutin  rifampin  rifapentine  some medicines to treat HIV infection like atazanavir, efavirenz, indinavir, lopinavir, nelfinavir, tipranavir, ritonavir  St. John's wort  warfarin This list may not describe all possible interactions. Give your health care provider a list of all the medicines, herbs, non-prescription drugs, or dietary supplements you use. Also tell them if you smoke, drink alcohol, or use illegal drugs. Some items may interact with your medicine. What should I watch for while using this medicine? Visit your doctor or health care professional for regular check ups. See your doctor if you or your partner has sexual contact with others, becomes HIV positive, or gets a sexual transmitted disease. This product does not protect you against HIV infection (AIDS) or other sexually transmitted diseases. You can check the placement of the IUD yourself by reaching up to the top of your vagina with clean fingers to feel the threads. Do not pull on the threads. It is a good habit to check placement after each menstrual period. Call your doctor right away if you feel more of the IUD than just the threads or if you cannot feel the threads at   all. The IUD may come out by itself. You may become pregnant if the device comes out. If you notice that the IUD has come out use a backup birth control method like condoms and call your health care provider. Using tampons will not change the position of the  IUD and are okay to use during your period. This IUD can be safely scanned with magnetic resonance imaging (MRI) only under specific conditions. Before you have an MRI, tell your healthcare provider that you have an IUD in place, and which type of IUD you have in place. What side effects may I notice from receiving this medicine? Side effects that you should report to your doctor or health care professional as soon as possible:  allergic reactions like skin rash, itching or hives, swelling of the face, lips, or tongue  fever, flu-like symptoms  genital sores  high blood pressure  no menstrual period for 6 weeks during use  pain, swelling, warmth in the leg  pelvic pain or tenderness  severe or sudden headache  signs of pregnancy  stomach cramping  sudden shortness of breath  trouble with balance, talking, or walking  unusual vaginal bleeding, discharge  yellowing of the eyes or skin Side effects that usually do not require medical attention (report to your doctor or health care professional if they continue or are bothersome):  acne  breast pain  change in sex drive or performance  changes in weight  cramping, dizziness, or faintness while the device is being inserted  headache  irregular menstrual bleeding within first 3 to 6 months of use  nausea This list may not describe all possible side effects. Call your doctor for medical advice about side effects. You may report side effects to FDA at 1-800-FDA-1088. Where should I keep my medicine? This does not apply. NOTE: This sheet is a summary. It may not cover all possible information. If you have questions about this medicine, talk to your doctor, pharmacist, or health care provider.  2020 Elsevier/Gold Standard (2017-11-03 13:22:01) Endometrial Ablation Endometrial ablation is a procedure that destroys the thin inner layer of the lining of the uterus (endometrium). This procedure may be done:  To stop heavy  periods.  To stop bleeding that is causing anemia.  To control irregular bleeding.  To treat bleeding caused by small tumors (fibroids) in the endometrium. This procedure is often an alternative to major surgery, such as removal of the uterus and cervix (hysterectomy). As a result of this procedure:  You may not be able to have children. However, if you are premenopausal (you have not gone through menopause): ? You may still have a small chance of getting pregnant. ? You will need to use a reliable method of birth control after the procedure to prevent pregnancy.  You may stop having a menstrual period, or you may have only a small amount of bleeding during your period. Menstruation may return several years after the procedure. Tell a health care provider about:  Any allergies you have.  All medicines you are taking, including vitamins, herbs, eye drops, creams, and over-the-counter medicines.  Any problems you or family members have had with the use of anesthetic medicines.  Any blood disorders you have.  Any surgeries you have had.  Any medical conditions you have. What are the risks? Generally, this is a safe procedure. However, problems may occur, including:  A hole (perforation) in the uterus or bowel.  Infection of the uterus, bladder, or vagina.  Bleeding.  Damage to other structures or organs.  An air bubble in the lung (air embolus).  Problems with pregnancy after the procedure.  Failure of the procedure.  Decreased ability to diagnose cancer in the endometrium. What happens before the procedure?  You will have tests of your endometrium to make sure there are no pre-cancerous cells or cancer cells present.  You may have an ultrasound of the uterus.  You may be given medicines to thin the endometrium.  Ask your health care provider about: ? Changing or stopping your regular medicines. This is especially important if you take diabetes medicines or blood  thinners. ? Taking medicines such as aspirin and ibuprofen. These medicines can thin your blood. Do not take these medicines before your procedure if your doctor tells you not to.  Plan to have someone take you home from the hospital or clinic. What happens during the procedure?   You will lie on an exam table with your feet and legs supported as in a pelvic exam.  To lower your risk of infection: ? Your health care team will wash or sanitize their hands and put on germ-free (sterile) gloves. ? Your genital area will be washed with soap.  An IV tube will be inserted into one of your veins.  You will be given a medicine to help you relax (sedative).  A surgical instrument with a light and camera (resectoscope) will be inserted into your vagina and moved into your uterus. This allows your surgeon to see inside your uterus.  Endometrial tissue will be removed using one of the following methods: ? Radiofrequency. This method uses a radiofrequency-alternating electric current to remove the endometrium. ? Cryotherapy. This method uses extreme cold to freeze the endometrium. ? Heated-free liquid. This method uses a heated saltwater (saline) solution to remove the endometrium. ? Microwave. This method uses high-energy microwaves to heat up the endometrium and remove it. ? Thermal balloon. This method involves inserting a catheter with a balloon tip into the uterus. The balloon tip is filled with heated fluid to remove the endometrium. The procedure may vary among health care providers and hospitals. What happens after the procedure?  Your blood pressure, heart rate, breathing rate, and blood oxygen level will be monitored until the medicines you were given have worn off.  As tissue healing occurs, you may notice vaginal bleeding for 4-6 weeks after the procedure. You may also experience: ? Cramps. ? Thin, watery vaginal discharge that is light pink or brown in color. ? A need to urinate more  frequently than usual. ? Nausea.  Do not drive for 24 hours if you were given a sedative.  Do not have sex or insert anything into your vagina until your health care provider approves. Summary  Endometrial ablation is done to treat the many causes of heavy menstrual bleeding.  The procedure may be done only after medications have been tried to control the bleeding.  Plan to have someone take you home from the hospital or clinic. This information is not intended to replace advice given to you by your health care provider. Make sure you discuss any questions you have with your health care provider. Document Revised: 06/09/2017 Document Reviewed: 01/10/2016 Elsevier Patient Education  [place].

## 2019-08-15 NOTE — Progress Notes (Signed)
Patient ID: Shirley [name], female   DOB: Nov 08, 1985, 34 y.o.   MRN: [id number]  No chief complaint on file. [id number] heavy periods  HPI Shirley [name] is a 34 y.o. female.  [id number] No LMP recorded. LMP 7/9 with some breakthrough spotting while taking Aygestin 5 mg daily. She has considered ablation. She uses no BCM, not sterilized. Had side effects from NuvaRing as a teen and had abnormal bleeding using IUD HPI  Past Medical History:  Diagnosis Date  . Eczema   . Hypertension   . Infection    UTI  . Low serum iron   . Ovarian cyst     Past Surgical History:  Procedure Laterality Date  . CESAREAN SECTION    . INDUCED ABORTION    . WISDOM TOOTH EXTRACTION      Family History  Problem Relation Age of Onset  . Cancer Maternal Grandmother        breast  . Healthy Mother   . COPD Father   . Other Neg Hx   . Hearing loss Neg Hx     Social History Social History   Tobacco Use  . Smoking status: Current Every Day Smoker    Packs/day: 0.50    Years: 2.00    Pack years: 1.00    Types: Cigarettes  . Smokeless tobacco: Never Used  . Tobacco comment: quit 2019  Vaping Use  . Vaping Use: Never used  Substance Use Topics  . Alcohol use: No  . Drug use: No    No Known Allergies  Current Outpatient Medications  Medication Sig Dispense Refill  . carvedilol (COREG) 25 MG tablet Take 1 tablet (25 mg total) by mouth 2 (two) times daily with a meal. 60 tablet 11  . dicyclomine (BENTYL) 20 MG tablet Take 1 tablet (20 mg total) by mouth 2 (two) times daily as needed for spasms (abdominal pain). 20 tablet 0  . docusate sodium (COLACE) 100 MG capsule Take 1 capsule (100 mg total) by mouth daily. 30 capsule 0  . ferrous sulfate (FERROUSUL) 325 (65 FE) MG tablet Take 1 tablet (325 mg total) by mouth 2 (two) times daily. 60 tablet 0  . ferrous sulfate 325 (65 FE) MG tablet Take 1 tablet (325 mg total) by mouth daily. 30 tablet 0  . ibuprofen (ADVIL) 800 MG tablet Take 1 tablet  (800 mg total) by mouth 3 (three) times daily. 21 tablet 0  . ibuprofen (ADVIL) 800 MG tablet Take 1 tablet (800 mg total) by mouth every 8 (eight) hours as needed. 30 tablet 5  . naproxen (NAPROSYN) 500 MG tablet Take 1 tablet (500 mg total) by mouth 2 (two) times daily. 30 tablet 0  . norethindrone (AYGESTIN) 5 MG tablet 2 tablets daily for 7 days then one by mouth daily 30 tablet 5  . polyethylene glycol powder (GLYCOLAX/MIRALAX) 17 GM/SCOOP powder Mix 1 capful in drink and take by mouth one to three times daily as needed for daily soft stools  OTC 116 g 0  . senna (SENOKOT) 8.6 MG TABS tablet Take 1 tablet (8.6 mg total) by mouth at bedtime as needed for moderate constipation. Until having soft daily stools 10 tablet 0  . metroNIDAZOLE (FLAGYL) 500 MG tablet Take 1 tablet (500 mg total) by mouth 2 (two) times daily. (Patient not taking: Reported on 08/15/2019) 14 tablet 2  . Prenatal-DSS-FeCb-FeGl-FA (CITRANATAL BLOOM) 90-1 MG TABS Take 1 tablet by mouth daily before breakfast. (Patient not taking: Reported on 07/28/2019)  30 tablet 11  . triamterene-hydrochlorothiazide (DYAZIDE) 37.5-25 MG capsule Take 1 each (1 capsule total) by mouth daily before breakfast. (Patient not taking: Reported on 07/28/2019) 30 capsule 11   No current facility-administered medications for this visit.    Review of Systems Review of Systems  Blood pressure (!) 168/105, pulse 75, height 5\' 1"  (1.549 m), weight 168 lb 8 oz (76.4 kg), unknown if currently breastfeeding.  Physical Exam Physical Exam  Data Reviewed  CLINICAL DATA:  Abnormal uterine bleeding.  EXAM: TRANSABDOMINAL AND TRANSVAGINAL ULTRASOUND OF PELVIS  DOPPLER ULTRASOUND OF OVARIES  TECHNIQUE: Both transabdominal and transvaginal ultrasound examinations of the pelvis were performed. Transabdominal technique was performed for global imaging of the pelvis including uterus, ovaries, adnexal regions, and pelvic cul-de-sac.  It was necessary  to proceed with endovaginal exam following the transabdominal exam to visualize the endometrium and ovaries. Color and duplex Doppler ultrasound was utilized to evaluate blood flow to the ovaries.  COMPARISON:  August 29, 2016.  FINDINGS: Uterus  Measurements: 10.5 x 6.0 x 5.1 cm = volume: 165 mL. Fibroid measuring 1.9 x 1.6 x 1.0 cm is noted anteriorly.  Endometrium  Thickness: 9 mm which is within normal limits for a patient of reproductive age. No focal abnormality visualized.  Right ovary  Not visualized.  No adnexal abnormality is noted.  Left ovary  Measurements: 4.4 x 4.2 x 3.4 cm = volume: 32 mL. 3.1 cm simple follicular cyst is noted.  Pulsed Doppler evaluation of left ovary demonstrates normal low-resistance arterial and venous waveforms.  Other findings  No abnormal free fluid.  IMPRESSION: Small uterine fibroid is noted.  Right ovary is not visualized.  Left ovary is unremarkable. There is no evidence of ovarian mass or torsion.  Endometrial thickness is within normal limits. If bleeding remains unresponsive to hormonal or medical therapy, sonohysterogram should be considered for focal lesion work-up. (Ref: Radiological Reasoning: Algorithmic Workup of Abnormal Vaginal Bleeding with Endovaginal Sonography and Sonohysterography. AJR 2008; 093:A35-57)   Electronically Signed   By: Marijo Conception M.D.   On: 08/09/2019 10:58  Assessment Intramural leiomyoma of uterus  Abnormal uterine bleeding (AUB) - Plan: norethindrone (AYGESTIN) 5 MG tablet small  fibroid   Plan Aygestin 10 mg for 7 days then 5 mg daily RTC 4 weeks Discussed ablation would also require sterilization. Mirena is an option    Shirley [name] 08/15/2019, 4:14 PM

## 2019-09-08 ENCOUNTER — Ambulatory Visit: Payer: PRIVATE HEALTH INSURANCE | Admitting: Obstetrics & Gynecology

## 2019-10-10 ENCOUNTER — Emergency Department (HOSPITAL_BASED_OUTPATIENT_CLINIC_OR_DEPARTMENT_OTHER)
Admission: EM | Admit: 2019-10-10 | Discharge: 2019-10-10 | Disposition: A | Discharge status: Home / Self Care | Payer: PRIVATE HEALTH INSURANCE | Attending: Emergency Medicine | Admitting: Emergency Medicine

## 2019-10-10 ENCOUNTER — Emergency Department (HOSPITAL_BASED_OUTPATIENT_CLINIC_OR_DEPARTMENT_OTHER): Payer: PRIVATE HEALTH INSURANCE

## 2019-10-10 ENCOUNTER — Other Ambulatory Visit: Payer: Self-pay

## 2019-10-10 ENCOUNTER — Encounter (HOSPITAL_BASED_OUTPATIENT_CLINIC_OR_DEPARTMENT_OTHER): Payer: Self-pay | Admitting: Emergency Medicine

## 2019-10-10 DIAGNOSIS — Z20822 Contact with and (suspected) exposure to covid-19: Secondary | ICD-10-CM | POA: Insufficient documentation

## 2019-10-10 DIAGNOSIS — F17210 Nicotine dependence, cigarettes, uncomplicated: Secondary | ICD-10-CM | POA: Diagnosis not present

## 2019-10-10 DIAGNOSIS — I10 Essential (primary) hypertension: Secondary | ICD-10-CM | POA: Insufficient documentation

## 2019-10-10 DIAGNOSIS — J069 Acute upper respiratory infection, unspecified: Secondary | ICD-10-CM | POA: Diagnosis not present

## 2019-10-10 DIAGNOSIS — B349 Viral infection, unspecified: Secondary | ICD-10-CM | POA: Insufficient documentation

## 2019-10-10 DIAGNOSIS — R059 Cough, unspecified: Secondary | ICD-10-CM | POA: Diagnosis present

## 2019-10-10 DIAGNOSIS — A09 Infectious gastroenteritis and colitis, unspecified: Secondary | ICD-10-CM

## 2019-10-10 LAB — URINALYSIS, ROUTINE W REFLEX MICROSCOPIC
Bilirubin Urine: NEGATIVE
Glucose, UA: NEGATIVE mg/dL
Ketones, ur: NEGATIVE mg/dL
Leukocytes,Ua: NEGATIVE
Nitrite: NEGATIVE
Protein, ur: NEGATIVE mg/dL
Specific Gravity, Urine: 1.015 (ref 1.005–1.030)
pH: 7 (ref 5.0–8.0)

## 2019-10-10 LAB — RESPIRATORY PANEL BY RT PCR (FLU A&B, COVID)
Influenza A by PCR: NEGATIVE
Influenza B by PCR: NEGATIVE
SARS Coronavirus 2 by RT PCR: NEGATIVE

## 2019-10-10 LAB — URINALYSIS, MICROSCOPIC (REFLEX)

## 2019-10-10 LAB — PREGNANCY, URINE: Preg Test, Ur: NEGATIVE

## 2019-10-10 NOTE — ED Triage Notes (Signed)
Pt states she started having headaches, diarrhea, sore throat, lower back pain, and light cough since yesterday. Denies any known covid exposure.

## 2019-10-10 NOTE — ED Provider Notes (Signed)
[facility] EMER[facility]ENC[facility] DEPARTMENT Provider Note   CSN: [id number] Arrival date & time: 10/10/19  0736     History Chief Complaint  Patient presents with  . Sore Throat  . Diarrhea  . Headache    Shirley [name] is a 34 y.o. female.  HPI     Throat sore, coughing, runny nose, diarrhea Throat started hurting yesterday out of nowhere Was ok until yesterday No fevers, chills Coughing up some mucus, clear Feeling some chest heaviness, small amt abdominal pain No dysuria Back pain CNA No known COVID exposures, not yet vaccinated   Past Medical History:  Diagnosis Date  . Eczema   . Hypertension   . Infection    UTI  . Low serum iron   . Ovarian cyst     Patient Active Problem List   Diagnosis Date Noted  . Abnormal uterine bleeding (AUB) 08/15/2019    Past Surgical History:  Procedure Laterality Date  . CESAREAN SECTION    . INDUCED ABORTION    . WISDOM TOOTH EXTRACTION       OB History    Gravida  6   Para  3   Term  1   Preterm  2   AB  3   Living  4     SAB  1   TAB  2   Ectopic      Multiple  1   Live Births  3           Family History  Problem Relation Age of Onset  . Cancer Maternal Grandmother        breast  . Healthy Mother   . COPD Father   . Other Neg Hx   . Hearing loss Neg Hx     Social History   Tobacco Use  . Smoking status: Current Every Day Smoker    Packs/day: 0.50    Years: 2.00    Pack years: 1.00    Types: Cigarettes  . Smokeless tobacco: Never Used  . Tobacco comment: quit 2019  Vaping Use  . Vaping Use: Never used  Substance Use Topics  . Alcohol use: No  . Drug use: No    Home Medications Prior to Admission medications   Medication Sig Start Date End Date Taking? Authorizing Provider  carvedilol (COREG) 25 MG tablet Take 1 tablet (25 mg total) by mouth 2 (two) times daily with a meal. 07/28/19   Shelly Bombard, MD  dicyclomine (BENTYL) 20 MG tablet Take 1 tablet (20 mg total)  by mouth 2 (two) times daily as needed for spasms (abdominal pain). 08/09/19   Gareth Morgan, MD  docusate sodium (COLACE) 100 MG capsule Take 1 capsule (100 mg total) by mouth daily. 02/28/19   Little, Wenda Overland, MD  ferrous sulfate (FERROUSUL) 325 (65 FE) MG tablet Take 1 tablet (325 mg total) by mouth 2 (two) times daily. 08/20/18   Jorje Guild, NP  ferrous sulfate 325 (65 FE) MG tablet Take 1 tablet (325 mg total) by mouth daily. 02/28/19   Little, Wenda Overland, MD  ibuprofen (ADVIL) 800 MG tablet Take 1 tablet (800 mg total) by mouth 3 (three) times daily. 11/01/18   Isla Pence, MD  ibuprofen (ADVIL) 800 MG tablet Take 1 tablet (800 mg total) by mouth every 8 (eight) hours as needed. 07/28/19   Shelly Bombard, MD  metroNIDAZOLE (FLAGYL) 500 MG tablet Take 1 tablet (500 mg total) by mouth 2 (two) times daily. Patient not  taking: Reported on 08/15/2019 08/02/19   Shelly Bombard, MD  naproxen (NAPROSYN) 500 MG tablet Take 1 tablet (500 mg total) by mouth 2 (two) times daily. 08/09/19   Gareth Morgan, MD  norethindrone (AYGESTIN) 5 MG tablet 2 tablets daily for 7 days then one by mouth daily 08/15/19   Woodroe Mode, MD  polyethylene glycol powder [facility]) 17 GM/SCOOP powder Mix 1 capful in drink and take by mouth one to three times daily as needed for daily soft stools  OTC 02/28/19   Little, Wenda Overland, MD  Prenatal-DSS-FeCb-FeGl-FA [facility] BLOOM) 90-1 MG TABS Take 1 tablet by mouth daily before breakfast. Patient not taking: Reported on 07/28/2019 03/01/19   Shelly Bombard, MD  senna (SENOKOT) 8.6 MG TABS tablet Take 1 tablet (8.6 mg total) by mouth at bedtime as needed for moderate constipation. Until having soft daily stools 02/28/19   Little, Wenda Overland, MD  triamterene-hydrochlorothiazide (DYAZIDE) 37.5-25 MG capsule Take 1 each (1 capsule total) by mouth daily before breakfast. Patient not taking: Reported on 07/28/2019 03/01/19   Shelly Bombard, MD     Allergies    Patient has no known allergies.  Review of Systems   Review of Systems  Constitutional: Positive for activity change, appetite change and fatigue. Negative for fever.  HENT: Positive for congestion, rhinorrhea and sore throat.   Respiratory: Positive for cough and shortness of breath.   Cardiovascular: Positive for chest pain.  Gastrointestinal: Positive for abdominal pain and diarrhea. Negative for nausea and vomiting.  Genitourinary: Negative for dysuria.  Musculoskeletal: Positive for back pain.  Neurological: Positive for headaches.    Physical Exam Updated Vital Signs BP (!) 158/84 (BP Location: Right Arm)   Pulse 65   Temp 99 F (37.2 C) (Oral)   Resp 10   Ht 5\' 1"  (1.549 m)   Wt 76.2 kg   SpO2 98%   BMI 31.74 kg/m   Physical Exam Vitals and nursing note reviewed.  Constitutional:      General: She is not in acute distress.    Appearance: She is well-developed. She is not diaphoretic.  HENT:     Head: Normocephalic and atraumatic.  Eyes:     Conjunctiva/sclera: Conjunctivae normal.  Cardiovascular:     Rate and Rhythm: Normal rate and regular rhythm.     Heart sounds: Normal heart sounds. No murmur heard.  No friction rub. No gallop.   Pulmonary:     Effort: Pulmonary effort is normal. No respiratory distress.     Breath sounds: Normal breath sounds. No wheezing or rales.  Abdominal:     General: There is no distension.     Palpations: Abdomen is soft.     Tenderness: There is no abdominal tenderness. There is no guarding.  Musculoskeletal:        General: No tenderness.     Cervical back: Normal range of motion.  Skin:    General: Skin is warm and dry.     Findings: No erythema or rash.  Neurological:     Mental Status: She is alert and oriented to person, place, and time.     ED Results / Procedures / Treatments   Labs (all labs ordered are listed, but only abnormal results are displayed) Labs Reviewed  URINALYSIS, ROUTINE W  REFLEX MICROSCOPIC - Abnormal; Notable for the following components:      Result Value   Hgb urine dipstick LARGE (*)    All other components within normal limits  URINALYSIS, MICROSCOPIC (REFLEX) - Abnormal; Notable for the following components:   Bacteria, UA FEW (*)    All other components within normal limits  RESPIRATORY PANEL BY RT PCR (FLU A&B, COVID)  PREGNANCY, URINE    EKG EKG Interpretation  Date/Time:  Monday October 10 2019 08:31:11 EDT Ventricular Rate:  78 PR Interval:    QRS Duration: 89 QT Interval:  401 QTC Calculation: 457 R Axis:   77 Text Interpretation: Sinus rhythm Low voltage, extremity leads No significant change since last tracing Confirmed by Gareth Morgan [phone]) on 10/10/2019 9:22:06 AM   Radiology DG Chest Portable 1 View  Result Date: 10/10/2019 CLINICAL DATA:  Shortness of breath EXAM: PORTABLE CHEST 1 VIEW COMPARISON:  11/01/2018 FINDINGS: Borderline heart size, stable and accentuated by portable technique. There is no edema, consolidation, effusion, or pneumothorax. IMPRESSION: No active disease. Electronically Signed   By: Monte Fantasia M.D.   On: 10/10/2019 08:24    Procedures Procedures (including critical care time)  Medications Ordered in ED Medications - No data to display  ED Course  I have reviewed the triage vital signs and the nursing notes.  Pertinent labs & imaging results that were available during my care of the patient were reviewed by me and considered in my medical decision making (see chart for details).    MDM Rules/Calculators/A&P                          34yo female presents with concern for cough, congestion, sore throat, diarrhea.  Abdominal exam benign. UA without signs of infection and pregnancy test negative.  EKG without significant findings. CXR without acute findings. COVID testing completed and negative. Suspect likely viral etiology of symptoms. Recommend continued supportive care. Patient discharged in  stable condition with understanding of reasons to return.    Final Clinical Impression(s) / ED Diagnoses Final diagnoses:  Viral syndrome  Upper respiratory tract infection, unspecified type  Diarrhea of infectious origin    Rx / DC Orders ED Discharge Orders    None       Gareth Morgan, MD 10/10/19 2134

## 2019-12-05 ENCOUNTER — Encounter (HOSPITAL_BASED_OUTPATIENT_CLINIC_OR_DEPARTMENT_OTHER): Payer: Self-pay | Admitting: *Deleted

## 2019-12-05 ENCOUNTER — Emergency Department (HOSPITAL_BASED_OUTPATIENT_CLINIC_OR_DEPARTMENT_OTHER)
Admission: EM | Admit: 2019-12-05 | Discharge: 2019-12-05 | Disposition: A | Discharge status: Home / Self Care | Payer: PRIVATE HEALTH INSURANCE | Attending: Emergency Medicine | Admitting: Emergency Medicine

## 2019-12-05 ENCOUNTER — Other Ambulatory Visit: Payer: Self-pay

## 2019-12-05 DIAGNOSIS — S56811A Strain of other muscles, fascia and tendons at forearm level, right arm, initial encounter: Secondary | ICD-10-CM | POA: Insufficient documentation

## 2019-12-05 DIAGNOSIS — F17210 Nicotine dependence, cigarettes, uncomplicated: Secondary | ICD-10-CM | POA: Diagnosis not present

## 2019-12-05 DIAGNOSIS — X58XXXA Exposure to other specified factors, initial encounter: Secondary | ICD-10-CM | POA: Diagnosis not present

## 2019-12-05 DIAGNOSIS — Z79899 Other long term (current) drug therapy: Secondary | ICD-10-CM | POA: Insufficient documentation

## 2019-12-05 DIAGNOSIS — S4991XA Unspecified injury of right shoulder and upper arm, initial encounter: Secondary | ICD-10-CM | POA: Diagnosis present

## 2019-12-05 DIAGNOSIS — T148XXA Other injury of unspecified body region, initial encounter: Secondary | ICD-10-CM

## 2019-12-05 DIAGNOSIS — I10 Essential (primary) hypertension: Secondary | ICD-10-CM | POA: Diagnosis not present

## 2019-12-05 MED ORDER — METHOCARBAMOL 500 MG PO TABS: 500.0000 mg | ORAL_TABLET | Freq: Two times a day (BID) | ORAL | 0 refills | Status: DC

## 2019-12-05 NOTE — ED Triage Notes (Signed)
Right arm pain x 3 weeks. When she turns her arm pain gets worse. Hx of tendonitis.

## 2019-12-05 NOTE — ED Provider Notes (Signed)
[facility] Provider Note   CSN: [id number] Arrival date & time: 12/05/19  2053     History Chief Complaint  Patient presents with  . Arm Pain    Shirley [name] is a 34 y.o. female with a past medical history of hypertension presenting to the ED with a chief complaint of right arm pain.  Reports aching pain to her right upper extremity that has worsened over the past 2 days.  Reports history of tendinitis but that pain felt a lot worse.  Denies any injury or trauma.  No swelling, chest pain, shortness of breath, recent immobilization or OCP use.  No history of DVT. No prior fracture, dislocation or procedure in the area.  HPI     Past Medical History:  Diagnosis Date  . Eczema   . Hypertension   . Infection    UTI  . Low serum iron   . Ovarian cyst     Patient Active Problem List   Diagnosis Date Noted  . Abnormal uterine bleeding (AUB) 08/15/2019    Past Surgical History:  Procedure Laterality Date  . CESAREAN SECTION    . INDUCED ABORTION    . WISDOM TOOTH EXTRACTION       OB History    Gravida  6   Para  3   Term  1   Preterm  2   AB  3   Living  4     SAB  1   TAB  2   Ectopic      Multiple  1   Live Births  3           Family History  Problem Relation Age of Onset  . Cancer Maternal Grandmother        breast  . Healthy Mother   . COPD Father   . Other Neg Hx   . Hearing loss Neg Hx     Social History   Tobacco Use  . Smoking status: Current Every Day Smoker    Packs/day: 0.50    Years: 2.00    Pack years: 1.00    Types: Cigarettes  . Smokeless tobacco: Never Used  . Tobacco comment: quit 2019  Vaping Use  . Vaping Use: Never used  Substance Use Topics  . Alcohol use: No  . Drug use: No    Home Medications Prior to Admission medications   Medication Sig Start Date End Date Taking? Authorizing Provider  carvedilol (COREG) 25 MG tablet Take 1 tablet (25 mg total) by mouth 2 (two)  times daily with a meal. 07/28/19   Shelly Bombard, MD  dicyclomine (BENTYL) 20 MG tablet Take 1 tablet (20 mg total) by mouth 2 (two) times daily as needed for spasms (abdominal pain). 08/09/19   Gareth Morgan, MD  docusate sodium (COLACE) 100 MG capsule Take 1 capsule (100 mg total) by mouth daily. 02/28/19   Little, Wenda Overland, MD  ferrous sulfate (FERROUSUL) 325 (65 FE) MG tablet Take 1 tablet (325 mg total) by mouth 2 (two) times daily. 08/20/18   Jorje Guild, NP  ferrous sulfate 325 (65 FE) MG tablet Take 1 tablet (325 mg total) by mouth daily. 02/28/19   Little, Wenda Overland, MD  ibuprofen (ADVIL) 800 MG tablet Take 1 tablet (800 mg total) by mouth 3 (three) times daily. 11/01/18   Isla Pence, MD  ibuprofen (ADVIL) 800 MG tablet Take 1 tablet (800 mg total) by mouth every 8 (eight) hours as  needed. 07/28/19   Shelly Bombard, MD  methocarbamol (ROBAXIN) 500 MG tablet Take 1 tablet (500 mg total) by mouth 2 (two) times daily. 12/05/19   Ernie Sagrero, PA-C  metroNIDAZOLE (FLAGYL) 500 MG tablet Take 1 tablet (500 mg total) by mouth 2 (two) times daily. Patient not taking: Reported on 08/15/2019 08/02/19   Shelly Bombard, MD  naproxen (NAPROSYN) 500 MG tablet Take 1 tablet (500 mg total) by mouth 2 (two) times daily. 08/09/19   Gareth Morgan, MD  norethindrone (AYGESTIN) 5 MG tablet 2 tablets daily for 7 days then one by mouth daily 08/15/19   Woodroe Mode, MD  polyethylene glycol powder [facility]) 17 GM/SCOOP powder Mix 1 capful in drink and take by mouth one to three times daily as needed for daily soft stools  OTC 02/28/19   Little, Wenda Overland, MD  Prenatal-DSS-FeCb-FeGl-FA [facility] BLOOM) 90-1 MG TABS Take 1 tablet by mouth daily before breakfast. Patient not taking: Reported on 07/28/2019 03/01/19   Shelly Bombard, MD  senna (SENOKOT) 8.6 MG TABS tablet Take 1 tablet (8.6 mg total) by mouth at bedtime as needed for moderate constipation. Until having soft daily  stools 02/28/19   Little, Wenda Overland, MD  triamterene-hydrochlorothiazide (DYAZIDE) 37.5-25 MG capsule Take 1 each (1 capsule total) by mouth daily before breakfast. Patient not taking: Reported on 07/28/2019 03/01/19   Shelly Bombard, MD    Allergies    Patient has no known allergies.  Review of Systems   Review of Systems  Constitutional: Negative for chills and fever.  Musculoskeletal: Positive for myalgias.  Neurological: Negative for weakness and numbness.    Physical Exam Updated Vital Signs BP 140/87   Pulse 82   Temp 98.7 F (37.1 C) (Oral)   Resp 20   Ht 5\' 1"  (1.549 m)   Wt 76.6 kg   LMP 11/17/2019   SpO2 100%   BMI 31.89 kg/m   Physical Exam Vitals and nursing note reviewed.  Constitutional:      General: She is not in acute distress.    Appearance: She is well-developed. She is not diaphoretic.  HENT:     Head: Normocephalic and atraumatic.  Eyes:     General: No scleral icterus.    Conjunctiva/sclera: Conjunctivae normal.  Cardiovascular:     Rate and Rhythm: Normal rate and regular rhythm.     Heart sounds: Normal heart sounds.  Pulmonary:     Effort: Pulmonary effort is normal. No respiratory distress.     Breath sounds: Normal breath sounds.  Musculoskeletal:        General: Tenderness present. No swelling or deformity.     Cervical back: Normal range of motion.     Comments: Diffuse tenderness of the right forearm without edema, erythema.  No wounds noted.  2+ radial pulse noted bilaterally.  Normal sensation to light touch.  Compartments are soft.  No changes to range of motion of bilateral elbows or wrist.  Skin:    Findings: No rash.  Neurological:     Mental Status: She is alert.     ED Results / Procedures / Treatments   Labs (all labs ordered are listed, but only abnormal results are displayed) Labs Reviewed - No data to display  EKG None  Radiology No results found.  Procedures Procedures (including critical care  time)  Medications Ordered in ED Medications - No data to display  ED Course  I have reviewed the triage vital signs and the  nursing notes.  Pertinent labs & imaging results that were available during my care of the patient were reviewed by me and considered in my medical decision making (see chart for details).    MDM Rules/Calculators/A&P                          34 year old female presenting to the ED for right arm pain that has worsened over the past 3 weeks.  Reports history of tendinitis.  Denies any injury or trauma.  Reports pain is worse with movement and palpation.  There is tenderness of the right forearm without overlying skin changes. Area with neurovascularly intact.  Normal strength and sensation.  No erythema, edema or warmth of joints.  She is afebrile here. She is requesting ultrasound to rule out DVT due to family history.  I have low suspicion for this but will order ultrasound in the morning.  In the meantime we will treat with muscle relaxer as I suspect that this is musculoskeletal in nature.  No further imaging warranted as she has not had any injuries. Doubt infectious cause.  We will give her instructions to return here in the morning for ultrasound and follow-up with PCP.  She denies possibility of pregnancy.   Patient is hemodynamically stable, in NAD, and able to ambulate in the ED. Evaluation does not show pathology that would require ongoing emergent intervention or inpatient treatment. I explained the diagnosis to the patient. Pain has been managed and has no complaints prior to discharge. Patient is comfortable with above plan and is stable for discharge at this time. All questions were answered prior to disposition. Strict return precautions for returning to the ED were discussed. Encouraged follow up with PCP.   An After Visit Summary was printed and given to the patient.   Portions of this note were generated with [employer]. Dictation errors  may occur despite best attempts at proofreading.  Final Clinical Impression(s) / ED Diagnoses Final diagnoses:  Muscle strain    Rx / DC Orders ED Discharge Orders         Ordered    VAS [place] UPPER EXTREMITY VENOUS DUPLEX        12/05/19 2248    methocarbamol (ROBAXIN) 500 MG tablet  2 times daily        12/05/19 2248           Delia Heady, PA-C 12/05/19 2254    Lennice Sites, DO 12/06/19 1505

## 2019-12-05 NOTE — Discharge Instructions (Addendum)
Use muscle relaxer in addition to Tylenol or Motrin to help with your symptoms. You will need to return in the morning to have an ultrasound done to make sure he did not have a blood clot. Follow-up with your primary care provider. Return to the ER if you start to experience worsening pain, swelling, chest pain or shortness of breath.

## 2019-12-16 ENCOUNTER — Ambulatory Visit (HOSPITAL_COMMUNITY): Admission: RE | Admit: 2019-12-16 | Payer: Medicaid Other | Source: Ambulatory Visit

## 2019-12-22 ENCOUNTER — Ambulatory Visit (HOSPITAL_COMMUNITY): Payer: Medicaid Other | Attending: Physician Assistant

## 2020-02-02 ENCOUNTER — Ambulatory Visit: Payer: PRIVATE HEALTH INSURANCE | Admitting: Obstetrics

## 2020-02-16 ENCOUNTER — Other Ambulatory Visit: Payer: Self-pay

## 2020-02-16 ENCOUNTER — Other Ambulatory Visit (HOSPITAL_COMMUNITY)
Admission: RE | Admit: 2020-02-16 | Discharge: 2020-02-16 | Disposition: A | Discharge status: Home / Self Care | Payer: PRIVATE HEALTH INSURANCE | Source: Ambulatory Visit | Attending: Obstetrics | Admitting: Obstetrics

## 2020-02-16 ENCOUNTER — Ambulatory Visit (INDEPENDENT_AMBULATORY_CARE_PROVIDER_SITE_OTHER): Payer: PRIVATE HEALTH INSURANCE | Admitting: *Deleted

## 2020-02-16 DIAGNOSIS — N898 Other specified noninflammatory disorders of vagina: Secondary | ICD-10-CM

## 2020-02-16 DIAGNOSIS — Z113 Encounter for screening for infections with a predominantly sexual mode of transmission: Secondary | ICD-10-CM | POA: Insufficient documentation

## 2020-02-16 DIAGNOSIS — N926 Irregular menstruation, unspecified: Secondary | ICD-10-CM

## 2020-02-16 LAB — POCT URINE PREGNANCY: Preg Test, Ur: POSITIVE — AB

## 2020-02-16 NOTE — Progress Notes (Signed)
Patient was assessed and managed by nursing staff during this encounter. I have reviewed the chart and agree with the documentation and plan. I have also made any necessary editorial changes.  Griffin Basil, MD 02/16/2020 5:02 PM

## 2020-02-16 NOTE — Progress Notes (Signed)
Shirley [name] presents today for UPT. She has no unusual complaints. Pt states she is having increase in vaginal discharge and request STD screen today.   LMP:01/08/20 EDD:10/14/2020    OBJECTIVE: Appears well, in no apparent distress.  OB History    Gravida  6   Para  3   Term  1   Preterm  2   AB  3   Living  4     SAB  1   IAB  2   Ectopic      Multiple  1   Live Births  3          Home UPT Result:Negative In-Office UPT result:Positive  I have reviewed the patient's allergies and medications.   ASSESSMENT: Positive pregnancy test, Self Swab preformed.  PLAN Pt to make appt if she chooses to continue with pregnancy.  Full panel self swab ordered today, will call pt once resulted.

## 2020-02-17 ENCOUNTER — Ambulatory Visit (HOSPITAL_COMMUNITY): Admission: RE | Admit: 2020-02-17 | Payer: Medicaid Other | Source: Ambulatory Visit

## 2020-02-17 LAB — CERVICOVAGINAL ANCILLARY ONLY
Bacterial Vaginitis (gardnerella): POSITIVE — AB
Candida Glabrata: NEGATIVE
Candida Vaginitis: POSITIVE — AB
Chlamydia: NEGATIVE
Comment: NEGATIVE
Comment: NEGATIVE
Comment: NEGATIVE
Comment: NEGATIVE
Comment: NEGATIVE
Comment: NORMAL
Neisseria Gonorrhea: NEGATIVE
Trichomonas: NEGATIVE

## 2020-02-20 ENCOUNTER — Other Ambulatory Visit: Payer: Self-pay

## 2020-02-20 MED ORDER — FLUCONAZOLE 150 MG PO TABS: 150.0000 mg | ORAL_TABLET | Freq: Once | ORAL | 0 refills | Status: AC

## 2020-03-07 ENCOUNTER — Emergency Department (HOSPITAL_BASED_OUTPATIENT_CLINIC_OR_DEPARTMENT_OTHER): Payer: PRIVATE HEALTH INSURANCE

## 2020-03-07 ENCOUNTER — Other Ambulatory Visit: Payer: Self-pay

## 2020-03-07 ENCOUNTER — Encounter (HOSPITAL_BASED_OUTPATIENT_CLINIC_OR_DEPARTMENT_OTHER): Payer: Self-pay | Admitting: Emergency Medicine

## 2020-03-07 ENCOUNTER — Encounter: Payer: Self-pay | Admitting: Family Medicine

## 2020-03-07 ENCOUNTER — Inpatient Hospital Stay (HOSPITAL_BASED_OUTPATIENT_CLINIC_OR_DEPARTMENT_OTHER)
Admission: EM | Admit: 2020-03-07 | Discharge: 2020-03-07 | Disposition: A | Discharge status: Home / Self Care | Payer: PRIVATE HEALTH INSURANCE | Attending: Obstetrics & Gynecology | Admitting: Obstetrics & Gynecology

## 2020-03-07 DIAGNOSIS — N9489 Other specified conditions associated with female genital organs and menstrual cycle: Secondary | ICD-10-CM

## 2020-03-07 DIAGNOSIS — Z332 Encounter for elective termination of pregnancy: Secondary | ICD-10-CM | POA: Insufficient documentation

## 2020-03-07 DIAGNOSIS — Z9889 Other specified postprocedural states: Secondary | ICD-10-CM

## 2020-03-07 DIAGNOSIS — O034 Incomplete spontaneous abortion without complication: Secondary | ICD-10-CM | POA: Diagnosis not present

## 2020-03-07 DIAGNOSIS — R109 Unspecified abdominal pain: Secondary | ICD-10-CM | POA: Insufficient documentation

## 2020-03-07 DIAGNOSIS — Z3A08 8 weeks gestation of pregnancy: Secondary | ICD-10-CM | POA: Diagnosis not present

## 2020-03-07 DIAGNOSIS — F17210 Nicotine dependence, cigarettes, uncomplicated: Secondary | ICD-10-CM | POA: Insufficient documentation

## 2020-03-07 DIAGNOSIS — I10 Essential (primary) hypertension: Secondary | ICD-10-CM | POA: Diagnosis not present

## 2020-03-07 DIAGNOSIS — N939 Abnormal uterine and vaginal bleeding, unspecified: Secondary | ICD-10-CM

## 2020-03-07 DIAGNOSIS — R102 Pelvic and perineal pain: Secondary | ICD-10-CM

## 2020-03-07 LAB — URINALYSIS, ROUTINE W REFLEX MICROSCOPIC
Bilirubin Urine: NEGATIVE
Glucose, UA: NEGATIVE mg/dL
Ketones, ur: NEGATIVE mg/dL
Nitrite: NEGATIVE
Protein, ur: 30 mg/dL — AB
Specific Gravity, Urine: 1.01 (ref 1.005–1.030)
pH: 7.5 (ref 5.0–8.0)

## 2020-03-07 LAB — CBC
HCT: 24.9 % — ABNORMAL LOW (ref 36.0–46.0)
Hemoglobin: 8 g/dL — ABNORMAL LOW (ref 12.0–15.0)
MCH: 27.7 pg (ref 26.0–34.0)
MCHC: 32.1 g/dL (ref 30.0–36.0)
MCV: 86.2 fL (ref 80.0–100.0)
Platelets: 322 10*3/uL (ref 150–400)
RBC: 2.89 MIL/uL — ABNORMAL LOW (ref 3.87–5.11)
RDW: 18.6 % — ABNORMAL HIGH (ref 11.5–15.5)
WBC: 4.8 10*3/uL (ref 4.0–10.5)
nRBC: 0 % (ref 0.0–0.2)

## 2020-03-07 LAB — URINALYSIS, MICROSCOPIC (REFLEX): RBC / HPF: >50 RBC/hpf (ref 0–5)

## 2020-03-07 LAB — HCG, QUANTITATIVE, PREGNANCY: hCG, Beta Chain, Quant, S: 4681 m[IU]/mL — ABNORMAL HIGH (ref <5)

## 2020-03-07 MED ORDER — OXYCODONE-ACETAMINOPHEN 5-325 MG PO TABS: 2.0000 | ORAL_TABLET | ORAL | 0 refills | Status: AC | PRN

## 2020-03-07 MED ORDER — OXYCODONE-ACETAMINOPHEN 5-325 MG PO TABS
1.0000 | ORAL_TABLET | Freq: Once | ORAL | Status: AC
  Administered 2020-03-07: 1 via ORAL
  Filled 2020-03-07: qty 1

## 2020-03-07 NOTE — ED Notes (Signed)
Pt transported via private car to [facility]. IV left in place. Pt aware that the need to go straight to the hospital without stoppiing.

## 2020-03-07 NOTE — MAU Note (Signed)
Pt did not take her BP medication today.

## 2020-03-07 NOTE — ED Notes (Signed)
Pt is unable to give urine sample now

## 2020-03-07 NOTE — ED Triage Notes (Signed)
Pt reports abdominal pain that hit her about about 1 hour ago.  Pt took an abortion pill on Friday and has had no trouble. Started bleeding right after taking pill and has been fine.  Bleeding is light.

## 2020-03-07 NOTE — ED Notes (Addendum)
Pt reports passing products of conception over the weekend
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
## 2020-03-07 NOTE — ED Notes (Signed)
ED Provider at bedside. 

## 2020-03-07 NOTE — Discharge Instructions (Addendum)
Please have your husband drive you directly to the MAU at [facility] (the main hospital building) in [place].  A map was provided.  Daphne at [facility] [address] [place],  [place]  [address] Get Driving Directions Main: [phone]  Bring these papers with you.  I spoke to Dr. Ernestina Patches who is expecting the patient.  She has discussed the possibility of a manual aspiration for retained products of conception.
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
## 2020-03-07 NOTE — ED Provider Notes (Signed)
[facility] EMERGENCY DEPARTMENT Provider Note   CSN: [id number] Arrival date & time: 03/07/20  0747     History Chief Complaint  Patient presents with  . Abdominal Pain    Shirley [name] is a 35 y.o. female presenting s/p medically induced abortion on 03/02/20 (5 days ago) at approx [redacted] weeks gestation with concern for acute pelvic pain.  Patient reports the afternoon after taking her abortion pill she had significant cramping and passed several very large clots, she suspects products of conception.  She has had light bleeding since then.  No significant pain until this morning when she was en route to work began having significant mid-pelvic pain.  It is 10/10.  Took motrin with little relief.  Never had this pain before.  Denies nausea, vomiting, diarrhea, fevers, chills or dysuria.  She cannot recall name of abortion pill, may be misoprostol or mifepristone or combination. She had this prescribed at "abortion clinic."  Not via OBGYN.  NKDA  HPI     Past Medical History:  Diagnosis Date  . Eczema   . Hypertension   . Infection    UTI  . Low serum iron   . Ovarian cyst     Patient Active Problem List   Diagnosis Date Noted  . Abnormal uterine bleeding (AUB) 08/15/2019    Past Surgical History:  Procedure Laterality Date  . CESAREAN SECTION    . INDUCED ABORTION    . WISDOM TOOTH EXTRACTION       OB History    Gravida  6   Para  3   Term  1   Preterm  2   AB  3   Living  4     SAB  1   IAB  2   Ectopic      Multiple  1   Live Births  3           Family History  Problem Relation Age of Onset  . Cancer Maternal Grandmother        breast  . Healthy Mother   . COPD Father   . Other Neg Hx   . Hearing loss Neg Hx     Social History   Tobacco Use  . Smoking status: Current Every Day Smoker    Packs/day: 0.50    Years: 2.00    Pack years: 1.00    Types: Cigarettes  . Smokeless tobacco: Never Used  . Tobacco comment: quit  2019  Vaping Use  . Vaping Use: Never used  Substance Use Topics  . Alcohol use: No  . Drug use: No    Home Medications Prior to Admission medications   Medication Sig Start Date End Date Taking? Authorizing Provider  carvedilol (COREG) 25 MG tablet Take 1 tablet (25 mg total) by mouth 2 (two) times daily with a meal. 07/28/19  Yes Shelly Bombard, MD  ferrous sulfate (FERROUSUL) 325 (65 FE) MG tablet Take 1 tablet (325 mg total) by mouth 2 (two) times daily. 08/20/18  Yes Jorje Guild, NP  ferrous sulfate 325 (65 FE) MG tablet Take 1 tablet (325 mg total) by mouth daily. 02/28/19  Yes Little, Wenda Overland, MD  ibuprofen (ADVIL) 800 MG tablet Take 1 tablet (800 mg total) by mouth 3 (three) times daily. 11/01/18  Yes Isla Pence, MD  dicyclomine (BENTYL) 20 MG tablet Take 1 tablet (20 mg total) by mouth 2 (two) times daily as needed for spasms (abdominal pain). 08/09/19  Gareth Morgan, MD  docusate sodium (COLACE) 100 MG capsule Take 1 capsule (100 mg total) by mouth daily. 02/28/19   Little, Wenda Overland, MD  methocarbamol (ROBAXIN) 500 MG tablet Take 1 tablet (500 mg total) by mouth 2 (two) times daily. 12/05/19   Khatri, Hina, PA-C  metroNIDAZOLE (FLAGYL) 500 MG tablet Take 1 tablet (500 mg total) by mouth 2 (two) times daily. Patient not taking: No sig reported 08/02/19   Shelly Bombard, MD  naproxen (NAPROSYN) 500 MG tablet Take 1 tablet (500 mg total) by mouth 2 (two) times daily. 08/09/19   Gareth Morgan, MD  norethindrone (AYGESTIN) 5 MG tablet 2 tablets daily for 7 days then one by mouth daily 08/15/19   Woodroe Mode, MD  polyethylene glycol powder [facility]) 17 GM/SCOOP powder Mix 1 capful in drink and take by mouth one to three times daily as needed for daily soft stools  OTC 02/28/19   Little, Wenda Overland, MD  Prenatal-DSS-FeCb-FeGl-FA (CITRANATAL BLOOM) 90-1 MG TABS Take 1 tablet by mouth daily before breakfast. Patient not taking: No sig reported 03/01/19    Shelly Bombard, MD  senna (SENOKOT) 8.6 MG TABS tablet Take 1 tablet (8.6 mg total) by mouth at bedtime as needed for moderate constipation. Until having soft daily stools 02/28/19   Little, Wenda Overland, MD  triamterene-hydrochlorothiazide (DYAZIDE) 37.5-25 MG capsule Take 1 each (1 capsule total) by mouth daily before breakfast. Patient not taking: No sig reported 03/01/19   Shelly Bombard, MD    Allergies    Patient has no known allergies.  Review of Systems   Review of Systems  Constitutional: Negative for chills and fever.  Respiratory: Negative for cough and shortness of breath.   Cardiovascular: Negative for chest pain and palpitations.  Gastrointestinal: Negative for abdominal pain, nausea and vomiting.  Genitourinary: Positive for pelvic pain and vaginal bleeding. Negative for dysuria, hematuria and vaginal discharge.  Skin: Negative for color change and rash.  All other systems reviewed and are negative.   Physical Exam Updated Vital Signs BP (!) 161/96 (BP Location: Right Arm)   Pulse 71   Temp 98.5 F (36.9 C) (Oral)   Resp 16   Ht 5\' 1"  (1.549 m)   Wt 72.6 kg   LMP 01/08/2020   SpO2 99%   BMI 30.23 kg/m   Physical Exam Constitutional:      General: She is not in acute distress. HENT:     Head: Normocephalic and atraumatic.  Eyes:     Conjunctiva/sclera: Conjunctivae normal.     Pupils: Pupils are equal, round, and reactive to light.  Cardiovascular:     Rate and Rhythm: Normal rate and regular rhythm.  Pulmonary:     Effort: Pulmonary effort is normal. No respiratory distress.  Abdominal:     General: There is no distension.     Tenderness: There is abdominal tenderness in the suprapubic area. There is no right CVA tenderness, left CVA tenderness, guarding or rebound. Negative signs include Murphy's sign.  Genitourinary:    Comments: GU exam performed with Tedd Sias RN as chaperone Speculum exam demonstrates moderate blood pooled in vaginal  vault, cervical os appears closed, small amount of bleeding from os, no erythema of the os, no purulent drainage, no malodorous discharge Skin:    General: Skin is warm and dry.  Neurological:     General: No focal deficit present.     Mental Status: She is alert. Mental status is at baseline.  Psychiatric:        Mood and Affect: Mood normal.        Behavior: Behavior normal.     ED Results / Procedures / Treatments   Labs (all labs ordered are listed, but only abnormal results are displayed) Labs Reviewed  CBC - Abnormal; Notable for the following components:      Result Value   RBC 2.89 (*)    Hemoglobin 8.0 (*)    HCT 24.9 (*)    RDW 18.6 (*)    All other components within normal limits  HCG, QUANTITATIVE, PREGNANCY - Abnormal; Notable for the following components:   hCG, Beta Chain, Quant, S 4,681 (*)    All other components within normal limits  URINALYSIS, ROUTINE W REFLEX MICROSCOPIC - Abnormal; Notable for the following components:   APPearance CLOUDY (*)    Hgb urine dipstick LARGE (*)    Protein, ur 30 (*)    Leukocytes,Ua SMALL (*)    All other components within normal limits  URINALYSIS, MICROSCOPIC (REFLEX) - Abnormal; Notable for the following components:   Bacteria, UA RARE (*)    Non Squamous Epithelial PRESENT (*)    All other components within normal limits    EKG None  Radiology US OB LESS THAN 14 WEEKS WITH OB TRANSVAGINAL  Result Date: 03/07/2020 CLINICAL DATA:  Abdominal cramping.  Drug induced abortion EXAM: OBSTETRIC <14 WK [place] AND TRANSVAGINAL OB US TECHNIQUE: Both transabdominal and transvaginal ultrasound examinations were performed for complete evaluation of the gestation as well as the maternal uterus, adnexal regions, and pelvic cul-de-sac. Transvaginal technique was performed to assess early pregnancy. COMPARISON:  08/09/2019 FINDINGS: Endometrial thickness measures up to 2.8 cm anterior to posterior. Along the anterior aspect of the  endometrium is a vague but focal area of slightly hypoechoic material with a small central cystic component. This area shows prominent color Doppler flow suggestive of trophoblastic tissue. There is history of Cesarian section. This area is above the lower uterine segment. The right ovary is not seen.  Normal appearance of the left ovary IMPRESSION: Thickened heterogeneous endometrium with focal area of suspected trophoblastic blood flow and products of conception. Electronically Signed   By: Monte Fantasia M.D.   On: 03/07/2020 10:19    Procedures Procedures   Medications Ordered in ED Medications  oxyCODONE-acetaminophen (PERCOCET/ROXICET) 5-325 MG per tablet 1 tablet (1 tablet Oral Given 03/07/20 0820)    ED Course  I have reviewed the triage vital signs and the nursing notes.  Pertinent labs & imaging results that were available during my care of the patient were reviewed by me and considered in my medical decision making (see chart for details).  35 yo female here with acute pelvic pain now 5 days s/p medically induced abortion  Will obtain U/S to evaluate for retained PoC, evaluate for ectopic Low risk for ectopic but we'll check an hcg level, she'll need to trend this in 2-3 days Check hgb level via CBC.  Hx of anemia - she's on iron for this.  Abdominal pain appears pelvic - less likely appendicitis or colitis or intraabdominal infection with its localizing on exam, and lack of GI symptoms.  PO percocet for pain  Otherwise she is well appearing on exam.  Vitals wnl.  Doubt PID, pelvic infection, sepsis.  *  U/S reviewed - retained PoC possible.  Consulted OBGN as noted below. Pain improved on reassessment but still present.  Clinical Course as of 03/07/20 1114  Wed Mar 07, 2020  0846 Hgb within baseline levels over past few years. [MT]  0258 I spoke to Dr Ernestina Patches as the [facility] provider for women's health.  She recommends pt transfer to MAU for evaluation and possible manual  aspiration of retained PoC.  The patient on my reassessment has improved pain, but agrees for transfer.  Her husband will drive her immediately after discharge.  I feel she is stable for transfer by private vehicle, with minimal bleeding and stable VS at this time.   [MT]    Clinical Course User Index [MT] Olman Yono, Carola Rhine, MD    Final Clinical Impression(s) / ED Diagnoses Final diagnoses:  Vaginal bleeding  Pelvic pain  Follow-up visit after therapeutic abortion  Retained products of conception following abortion    Rx / DC Orders ED Discharge Orders    None       Wyvonnia Dusky, MD 03/07/20 1115

## 2020-03-07 NOTE — MAU Note (Signed)
Shirley [name] is a 35 y.o. at [redacted]w[redacted]d here in MAU reporting: pt was sent over from [facility] reporting that she took Cytotec on Friday to terminate pregnancy, states everything has been fine until this AM. Started having a lot of pain and pressure. States bleeding is less than a period.  Pt has IV saline locked in placed upon arrival.  LMP: 01/08/20  Onset of complaint: today  Pain score: 4/10  Vitals:   03/07/20 1104 03/07/20 1239  BP: (!) 161/96 (!) 151/94  Pulse: 71 87  Resp: 16 16  Temp:  98.6 F (37 C)  SpO2: 99% 100%     Lab orders placed from triage: none

## 2020-03-07 NOTE — Telephone Encounter (Signed)
Patient called requesting a medication for yeast and bv. After reviewing her chart patient will need to be seen for this problem. She is currently at the ER at this time. Attempted to call her but there was no answer.

## 2020-05-14 ENCOUNTER — Telehealth: Payer: Self-pay

## 2020-05-14 ENCOUNTER — Other Ambulatory Visit: Payer: Self-pay

## 2020-05-14 MED ORDER — TERCONAZOLE 0.4 % VA CREA: 1.0000 | TOPICAL_CREAM | Freq: Every day | VAGINAL | 0 refills | Status: DC

## 2020-05-14 NOTE — Telephone Encounter (Signed)
Patient is requesting medications for yeast infection.

## 2020-05-14 NOTE — Telephone Encounter (Signed)
Patient is requesting medication for yeast infection. Will send in [place] for five days.

## 2020-05-18 ENCOUNTER — Telehealth: Payer: Self-pay

## 2020-05-18 NOTE — Telephone Encounter (Signed)
Patient called and left message on triage vm stating that she had some questions. Returned patient's call. No answer. Left message for patient to return call to the office.

## 2020-09-24 ENCOUNTER — Ambulatory Visit: Payer: PRIVATE HEALTH INSURANCE | Admitting: Obstetrics

## 2020-09-24 ENCOUNTER — Encounter: Payer: Self-pay | Admitting: Obstetrics

## 2020-10-11 ENCOUNTER — Other Ambulatory Visit: Payer: Self-pay

## 2020-10-11 ENCOUNTER — Emergency Department (HOSPITAL_BASED_OUTPATIENT_CLINIC_OR_DEPARTMENT_OTHER): Payer: PRIVATE HEALTH INSURANCE

## 2020-10-11 ENCOUNTER — Emergency Department (HOSPITAL_BASED_OUTPATIENT_CLINIC_OR_DEPARTMENT_OTHER)
Admission: EM | Admit: 2020-10-11 | Discharge: 2020-10-11 | Disposition: A | Discharge status: Home / Self Care | Payer: PRIVATE HEALTH INSURANCE | Attending: Emergency Medicine | Admitting: Emergency Medicine

## 2020-10-11 ENCOUNTER — Other Ambulatory Visit (HOSPITAL_BASED_OUTPATIENT_CLINIC_OR_DEPARTMENT_OTHER): Payer: Self-pay

## 2020-10-11 ENCOUNTER — Encounter (HOSPITAL_BASED_OUTPATIENT_CLINIC_OR_DEPARTMENT_OTHER): Payer: Self-pay

## 2020-10-11 DIAGNOSIS — I10 Essential (primary) hypertension: Secondary | ICD-10-CM | POA: Diagnosis not present

## 2020-10-11 DIAGNOSIS — M542 Cervicalgia: Secondary | ICD-10-CM | POA: Insufficient documentation

## 2020-10-11 DIAGNOSIS — M25512 Pain in left shoulder: Secondary | ICD-10-CM | POA: Diagnosis present

## 2020-10-11 DIAGNOSIS — F17210 Nicotine dependence, cigarettes, uncomplicated: Secondary | ICD-10-CM | POA: Insufficient documentation

## 2020-10-11 DIAGNOSIS — M546 Pain in thoracic spine: Secondary | ICD-10-CM | POA: Insufficient documentation

## 2020-10-11 LAB — CBC
HCT: 29.5 % — ABNORMAL LOW (ref 36.0–46.0)
Hemoglobin: 8.7 g/dL — ABNORMAL LOW (ref 12.0–15.0)
MCH: 21.2 pg — ABNORMAL LOW (ref 26.0–34.0)
MCHC: 29.5 g/dL — ABNORMAL LOW (ref 30.0–36.0)
MCV: 72 fL — ABNORMAL LOW (ref 80.0–100.0)
Platelets: 412 10*3/uL — ABNORMAL HIGH (ref 150–400)
RBC: 4.1 MIL/uL (ref 3.87–5.11)
RDW: 18.2 % — ABNORMAL HIGH (ref 11.5–15.5)
WBC: 5.6 10*3/uL (ref 4.0–10.5)
nRBC: 0 % (ref 0.0–0.2)

## 2020-10-11 LAB — BASIC METABOLIC PANEL
Anion gap: 10 (ref 5–15)
BUN: 9 mg/dL (ref 6–20)
CO2: 22 mmol/L (ref 22–32)
Calcium: 9.3 mg/dL (ref 8.9–10.3)
Chloride: 102 mmol/L (ref 98–111)
Creatinine, Ser: 0.72 mg/dL (ref 0.44–1.00)
GFR, Estimated: >60 mL/min (ref >60)
Glucose, Bld: 114 mg/dL — ABNORMAL HIGH (ref 70–99)
Potassium: 3.6 mmol/L (ref 3.5–5.1)
Sodium: 134 mmol/L — ABNORMAL LOW (ref 135–145)

## 2020-10-11 LAB — TROPONIN I (HIGH SENSITIVITY): Troponin I (High Sensitivity): 2 ng/L (ref <18)

## 2020-10-11 LAB — PREGNANCY, URINE: Preg Test, Ur: NEGATIVE

## 2020-10-11 MED ORDER — CYCLOBENZAPRINE HCL 5 MG PO TABS
10.0000 mg | ORAL_TABLET | Freq: Two times a day (BID) | ORAL | 0 refills | Status: AC | PRN
Start: 2020-10-11 — End: ?
  Filled 2020-10-11: qty 10, 3d supply, fill #0

## 2020-10-11 MED ORDER — KETOROLAC TROMETHAMINE 30 MG/ML IJ SOLN
30.0000 mg | Freq: Once | INTRAMUSCULAR | Status: AC
  Administered 2020-10-11: 30 mg via INTRAVENOUS
  Filled 2020-10-11: qty 1

## 2020-10-11 MED ORDER — CARVEDILOL 12.5 MG PO TABS
25.0000 mg | ORAL_TABLET | Freq: Once | ORAL | Status: AC
  Administered 2020-10-11: 25 mg via ORAL
  Filled 2020-10-11: qty 2

## 2020-10-11 MED ORDER — CARVEDILOL 12.5 MG PO TABS
12.5000 mg | ORAL_TABLET | Freq: Two times a day (BID) | ORAL | 0 refills | Status: AC
  Filled 2020-10-11: qty 60, 30d supply, fill #0

## 2020-10-11 MED ORDER — CARVEDILOL 12.5 MG PO TABS: 25.0000 mg | ORAL_TABLET | Freq: Two times a day (BID) | ORAL | Status: DC

## 2020-10-11 MED ORDER — CYCLOBENZAPRINE HCL 5 MG PO TABS
5.0000 mg | ORAL_TABLET | Freq: Once | ORAL | Status: AC
  Administered 2020-10-11: 5 mg via ORAL
  Filled 2020-10-11: qty 1

## 2020-10-11 NOTE — Discharge Instructions (Addendum)
You have been seen and discharged from the emergency department.  Your cardiac work-up and x-rays were normal.  Take hypertensive medication as prescribed, your blood pressure was high at this visit.  Untreated hypertension increases your risk for other diseases.  Use Tylenol and Ibuprofen for pain control. Take muscle relaxer as needed.  Do not mix this medication with alcohol or other sedating medications. Do not drive or do heavy physical activity and to know how this medication affects you.  It may cause drowsiness.  Follow-up with your primary provider for reevaluation and further care. Take home medications as prescribed. If you have any worsening symptoms or further concerns for your health please return to an emergency department for further evaluation.

## 2020-10-11 NOTE — ED Triage Notes (Addendum)
Pt arrives ambulatory to ED with c/o left upper back, shoulder, and arm pain since yesterday. 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 Used tylenol and heating pads at home with no relief. Last tylenol was around 3 am today. Pt also reports she has not been taking her BP medications. Pt denies CP.

## 2020-10-11 NOTE — ED Notes (Signed)
ED Provider at bedside. 

## 2020-10-11 NOTE — ED Provider Notes (Signed)
[facility] [place] EMERGENCY DEPARTMENT Provider Note   CSN: [id number] Arrival date & time: 10/11/20  0736     History Chief Complaint  Patient presents with   Arm Pain    Shirley [name] is a 35 y.o. female.  HPI  35 year old female with past medical history of HTN, noncompliant with her medication presents emergency department with upper back and left shoulder pain.  Patient is a CNA.  She states yesterday she developed bilateral upper back pain which is now localized to the left upper back, left neck and left shoulder.  She has pain with certain movements and positions.  Denies any anterior chest pain, shortness of breath, cough.  No recent fever or illness.  Past Medical History:  Diagnosis Date   Eczema    Hypertension    Infection    UTI   Low serum iron    Ovarian cyst     Patient Active Problem List   Diagnosis Date Noted   Abnormal uterine bleeding (AUB) 08/15/2019    Past Surgical History:  Procedure Laterality Date   CESAREAN SECTION     INDUCED ABORTION     WISDOM TOOTH EXTRACTION       OB History     Gravida  7   Para  3   Term  1   Preterm  2   AB  3   Living  4      SAB  1   IAB  2   Ectopic      Multiple  1   Live Births  3           Family History  Problem Relation Age of Onset   Cancer Maternal Grandmother        breast   Healthy Mother    COPD Father    Other Neg Hx    Hearing loss Neg Hx     Social History   Tobacco Use   Smoking status: Every Day    Packs/day: 0.50    Years: 2.00    Pack years: 1.00    Types: Cigarettes   Smokeless tobacco: Never   Tobacco comments:    quit 2019  Vaping Use   Vaping Use: Never used  Substance Use Topics   Alcohol use: No   Drug use: No    Home Medications Prior to Admission medications   Medication Sig Start Date End Date Taking? Authorizing Provider  carvedilol (COREG) 25 MG tablet Take 1 tablet (25 mg total) by mouth 2 (two) times daily with a meal.  07/28/19   Shelly Bombard, MD  docusate sodium (COLACE) 100 MG capsule Take 1 capsule (100 mg total) by mouth daily. 02/28/19   Little, Wenda Overland, MD  ferrous sulfate (FERROUSUL) 325 (65 FE) MG tablet Take 1 tablet (325 mg total) by mouth 2 (two) times daily. 08/20/18   Jorje Guild, NP  ferrous sulfate 325 (65 FE) MG tablet Take 1 tablet (325 mg total) by mouth daily. 02/28/19   Little, Wenda Overland, MD  ibuprofen (ADVIL) 800 MG tablet Take 1 tablet (800 mg total) by mouth 3 (three) times daily. 11/01/18   Isla Pence, MD  methocarbamol (ROBAXIN) 500 MG tablet Take 1 tablet (500 mg total) by mouth 2 (two) times daily. 12/05/19   Khatri, Hina, PA-C  oxyCODONE-acetaminophen (PERCOCET/ROXICET) 5-325 MG tablet Take 2 tablets by mouth every 4 (four) hours as needed for severe pain. 03/07/20   Gavin Pound, CNM  polyethylene glycol powder (  GLYCOLAX/MIRALAX) 17 GM/SCOOP powder Mix 1 capful in drink and take by mouth one to three times daily as needed for daily soft stools  OTC 02/28/19   Little, Wenda Overland, MD  senna (SENOKOT) 8.6 MG TABS tablet Take 1 tablet (8.6 mg total) by mouth at bedtime as needed for moderate constipation. Until having soft daily stools 02/28/19   Little, Wenda Overland, MD  terconazole (TERAZOL 7) 0.4 % vaginal cream Place 1 applicator vaginally at bedtime. 05/14/20   Shelly Bombard, MD  triamterene-hydrochlorothiazide (DYAZIDE) 37.5-25 MG capsule Take 1 each (1 capsule total) by mouth daily before breakfast. Patient not taking: No sig reported 03/01/19   Shelly Bombard, MD    Allergies    Patient has no known allergies.  Review of Systems   Review of Systems  Constitutional:  Negative for chills and fever.  HENT:  Negative for congestion.   Eyes:  Negative for visual disturbance.  Respiratory:  Negative for cough and shortness of breath.   Cardiovascular:  Negative for chest pain, palpitations and leg swelling.  Gastrointestinal:  Negative for abdominal pain,  diarrhea and vomiting.  Genitourinary:  Negative for dysuria.  Musculoskeletal:  Positive for back pain.       +left shoulder pain  Skin:  Negative for rash.  Neurological:  Negative for headaches.   Physical Exam Updated Vital Signs BP (!) 155/105   Pulse 82   Temp 98.1 F (36.7 C) (Oral)   Resp (!) 26   Ht 5\' 1"  (1.549 m)   Wt 72.6 kg   LMP 10/01/2020   SpO2 97%   BMI 30.23 kg/m   Physical Exam Vitals and nursing note reviewed.  Constitutional:      Appearance: Normal appearance.  HENT:     Head: Normocephalic.     Mouth/Throat:     Mouth: Mucous membranes are moist.  Cardiovascular:     Rate and Rhythm: Normal rate.  Pulmonary:     Effort: Pulmonary effort is normal. No respiratory distress.  Abdominal:     Palpations: Abdomen is soft.     Tenderness: There is no abdominal tenderness.  Musculoskeletal:     Comments: TTP left upper back, left trapezius, left deltoid, pain with certain ROM of left shoulder  Skin:    General: Skin is warm.  Neurological:     Mental Status: She is alert and oriented to person, place, and time. Mental status is at baseline.  Psychiatric:        Mood and Affect: Mood normal.    ED Results / Procedures / Treatments   Labs (all labs ordered are listed, but only abnormal results are displayed) Labs Reviewed  BASIC METABOLIC PANEL - Abnormal; Notable for the following components:      Result Value   Sodium 134 (*)    Glucose, Bld 114 (*)    All other components within normal limits  CBC - Abnormal; Notable for the following components:   Hemoglobin 8.7 (*)    HCT 29.5 (*)    MCV 72.0 (*)    MCH 21.2 (*)    MCHC 29.5 (*)    RDW 18.2 (*)    Platelets 412 (*)    All other components within normal limits  PREGNANCY, URINE  TROPONIN I (HIGH SENSITIVITY)    EKG None  Radiology No results found.  Procedures Procedures   Medications Ordered in ED Medications - No data to display  ED Course  I have reviewed the triage  vital signs and the nursing notes.  Pertinent labs & imaging results that were available during my care of the patient were reviewed by me and considered in my medical decision making (see chart for details).    MDM Rules/Calculators/A&P                           35 year old female presents emergency department with bilateral upper back pain and left shoulder pain.  EKG is normal and unchanged for the patient.  She is PERC negative.  Cardiac work-up is normal, x-rays show no acute disease.  After muscle relaxer and Toradol patient symptoms have resolved.  Patient was hypertensive at this visit.  She is supposed to be taking Coreg daily, she has been noncompliant with her medication.  She was given a dose here in the department and her blood pressure appropriately reduced.  No active chest pain, headache, blurred vision.  We will send a prescription for Coreg and encouraged her to be compliant.  I believe the back/shoulder pain is musculoskeletal.  We will treat as such, patient at this time appears safe and stable for discharge and will be treated as an outpatient.  Discharge plan and strict return to ED precautions discussed, patient verbalizes understanding and agreement.  Final Clinical Impression(s) / ED Diagnoses Final diagnoses:  None    Rx / DC Orders ED Discharge Orders     None        Lorelle Gibbs, DO 10/11/20 1140

## 2020-10-11 NOTE — ED Notes (Signed)
Patient transported to X-ray 

## 2020-12-12 ENCOUNTER — Other Ambulatory Visit: Payer: Self-pay

## 2020-12-12 DIAGNOSIS — B9689 Other specified bacterial agents as the cause of diseases classified elsewhere: Secondary | ICD-10-CM

## 2020-12-12 MED ORDER — METRONIDAZOLE 500 MG PO TABS: 500.0000 mg | ORAL_TABLET | Freq: Two times a day (BID) | ORAL | 0 refills | Status: AC

## 2020-12-12 NOTE — Progress Notes (Signed)
Pt reports malodorous vaginal discharge. Flagyl sent to pharmacy per protocol. Pt to contact office if sx's does not improve after txt.

## 2021-01-20 ENCOUNTER — Encounter (HOSPITAL_BASED_OUTPATIENT_CLINIC_OR_DEPARTMENT_OTHER): Payer: Self-pay

## 2021-01-20 ENCOUNTER — Emergency Department (HOSPITAL_BASED_OUTPATIENT_CLINIC_OR_DEPARTMENT_OTHER)
Admission: EM | Admit: 2021-01-20 | Discharge: 2021-01-20 | Disposition: A | Discharge status: Home / Self Care | Payer: Medicaid Other | Attending: Emergency Medicine | Admitting: Emergency Medicine

## 2021-01-20 ENCOUNTER — Other Ambulatory Visit: Payer: Self-pay

## 2021-01-20 DIAGNOSIS — J069 Acute upper respiratory infection, unspecified: Secondary | ICD-10-CM

## 2021-01-20 DIAGNOSIS — R197 Diarrhea, unspecified: Secondary | ICD-10-CM | POA: Diagnosis not present

## 2021-01-20 DIAGNOSIS — Z20822 Contact with and (suspected) exposure to covid-19: Secondary | ICD-10-CM | POA: Insufficient documentation

## 2021-01-20 DIAGNOSIS — J101 Influenza due to other identified influenza virus with other respiratory manifestations: Secondary | ICD-10-CM | POA: Diagnosis not present

## 2021-01-20 DIAGNOSIS — R509 Fever, unspecified: Secondary | ICD-10-CM | POA: Diagnosis present

## 2021-01-20 DIAGNOSIS — R051 Acute cough: Secondary | ICD-10-CM

## 2021-01-20 LAB — RESP PANEL BY RT-PCR (FLU A&B, COVID) ARPGX2
Influenza A by PCR: POSITIVE — AB
Influenza B by PCR: NEGATIVE
SARS Coronavirus 2 by RT PCR: NEGATIVE

## 2021-01-20 MED ORDER — OSELTAMIVIR PHOSPHATE 75 MG PO CAPS: 75.0000 mg | ORAL_CAPSULE | Freq: Two times a day (BID) | ORAL | 0 refills | Status: AC

## 2021-01-20 NOTE — ED Provider Notes (Signed)
[facility] Provider Note   CSN: [id number] Arrival date & time: 01/20/21  1925     History  Chief Complaint  Patient presents with   Fever    Shirley [name] is a 36 y.o. female.  The history is provided by the patient and medical records. No language interpreter was used.  Fever Temp source:  Subjective Severity:  Moderate Onset quality:  Gradual Duration:  3 days Timing:  Constant Progression:  Improving Chronicity:  New Relieved by:  Nothing Worsened by:  Nothing Ineffective treatments:  None tried Associated symptoms: chills, congestion, cough, diarrhea and rhinorrhea   Associated symptoms: no chest pain, no confusion, no dysuria, no headaches, no myalgias, no nausea, no rash, no somnolence, no sore throat and no vomiting   Risk factors: sick contacts (sister with flu)       Home Medications Prior to Admission medications   Medication Sig Start Date End Date Taking? Authorizing Provider  carvedilol (COREG) 12.5 MG tablet Take 1 tablet (12.5 mg total) by mouth 2 (two) times daily with a meal. 10/11/20   Horton, Kristie M, DO  cyclobenzaprine (FLEXERIL) 5 MG tablet Take 2 tablets (10 mg total) by mouth 2 (two) times daily as needed for muscle spasms. 10/11/20   Horton, Alvin Critchley, DO  docusate sodium (COLACE) 100 MG capsule Take 1 capsule (100 mg total) by mouth daily. 02/28/19   Little, Wenda Overland, MD  ferrous sulfate (FERROUSUL) 325 (65 FE) MG tablet Take 1 tablet (325 mg total) by mouth 2 (two) times daily. 08/20/18   Jorje Guild, NP  ferrous sulfate 325 (65 FE) MG tablet Take 1 tablet (325 mg total) by mouth daily. 02/28/19   Little, Wenda Overland, MD  ibuprofen (ADVIL) 800 MG tablet Take 1 tablet (800 mg total) by mouth 3 (three) times daily. 11/01/18   Isla Pence, MD  methocarbamol (ROBAXIN) 500 MG tablet Take 1 tablet (500 mg total) by mouth 2 (two) times daily. 12/05/19   Khatri, Hina, PA-C  metroNIDAZOLE (FLAGYL) 500 MG  tablet Take 1 tablet (500 mg total) by mouth 2 (two) times daily. 12/12/20   Woodroe Mode, MD  oxyCODONE-acetaminophen (PERCOCET/ROXICET) 5-325 MG tablet Take 2 tablets by mouth every 4 (four) hours as needed for severe pain. 03/07/20   Gavin Pound, CNM  polyethylene glycol powder (GLYCOLAX/MIRALAX) 17 GM/SCOOP powder Mix 1 capful in drink and take by mouth one to three times daily as needed for daily soft stools  OTC 02/28/19   Little, Wenda Overland, MD  senna (SENOKOT) 8.6 MG TABS tablet Take 1 tablet (8.6 mg total) by mouth at bedtime as needed for moderate constipation. Until having soft daily stools 02/28/19   Little, Wenda Overland, MD  terconazole (TERAZOL 7) 0.4 % vaginal cream Place 1 applicator vaginally at bedtime. 05/14/20   Shelly Bombard, MD  triamterene-hydrochlorothiazide (DYAZIDE) 37.5-25 MG capsule Take 1 each (1 capsule total) by mouth daily before breakfast. Patient not taking: No sig reported 03/01/19   Shelly Bombard, MD      Allergies    Patient has no known allergies.    Review of Systems   Review of Systems  Constitutional:  Positive for chills, fatigue and fever. Negative for diaphoresis.  HENT:  Positive for congestion and rhinorrhea. Negative for sore throat.   Respiratory:  Positive for cough. Negative for chest tightness, shortness of breath and wheezing.   Cardiovascular:  Negative for chest pain, palpitations and leg swelling.  Gastrointestinal:  Positive for diarrhea. Negative for constipation, nausea and vomiting.  Genitourinary:  Negative for dysuria and flank pain.  Musculoskeletal:  Negative for back pain and myalgias.  Skin:  Negative for rash.  Neurological:  Negative for dizziness, weakness, light-headedness and headaches.  Psychiatric/Behavioral:  Negative for confusion.   All other systems reviewed and are negative.  Physical Exam Updated Vital Signs BP 136/89 (BP Location: Right Arm)    Pulse 82    Temp 100.1 F (37.8 C) (Oral)    Resp 14     Ht 5\' 1"  (1.549 m)    Wt 81.6 kg    LMP 01/10/2021    SpO2 96%    BMI 34.01 kg/m  Physical Exam Vitals and nursing note reviewed.  Constitutional:      General: She is not in acute distress.    Appearance: She is well-developed. She is not ill-appearing, toxic-appearing or diaphoretic.  HENT:     Head: Normocephalic and atraumatic.     Nose: Congestion and rhinorrhea present.     Mouth/Throat:     Mouth: Mucous membranes are moist.     Pharynx: No oropharyngeal exudate or posterior oropharyngeal erythema.  Eyes:     Extraocular Movements: Extraocular movements intact.     Conjunctiva/sclera: Conjunctivae normal.     Pupils: Pupils are equal, round, and reactive to light.  Cardiovascular:     Rate and Rhythm: Normal rate and regular rhythm.     Heart sounds: No murmur heard. Pulmonary:     Effort: Pulmonary effort is normal. No respiratory distress.     Breath sounds: Normal breath sounds. No wheezing, rhonchi or rales.  Chest:     Chest wall: No tenderness.  Abdominal:     General: Abdomen is flat.     Palpations: Abdomen is soft.     Tenderness: There is no abdominal tenderness. There is no right CVA tenderness, left CVA tenderness, guarding or rebound.  Musculoskeletal:        General: No swelling or tenderness.     Cervical back: Neck supple. No tenderness.     Right lower leg: No edema.     Left lower leg: No edema.  Skin:    General: Skin is warm and dry.     Capillary Refill: Capillary refill takes less than 2 seconds.     Findings: No erythema.  Neurological:     Mental Status: She is alert. Mental status is at baseline.  Psychiatric:        Mood and Affect: Mood normal.    ED Results / Procedures / Treatments   Labs (all labs ordered are listed, but only abnormal results are displayed) Labs Reviewed  RESP PANEL BY RT-PCR (FLU A&B, COVID) ARPGX2 - Abnormal; Notable for the following components:      Result Value   Influenza A by PCR POSITIVE (*)    All other  components within normal limits    EKG None  Radiology No results found.  Procedures Procedures    Medications Ordered in ED Medications - No data to display  ED Course/ Medical Decision Making/ A&P                           Medical Decision Making  Shirley [name] is a 36 y.o. female with a past medical history significant for well-controlled hypertension and abnormal uterine bleeding who presents with URI symptoms.  According to patient, for the last 3 days she  has had URI symptoms with rhinorrhea, congestion, cough, malaise, fatigue, and chills.  She reports that her sister tested positive for influenza and she thinks she has the same.  She denies any nausea, vomiting, or constipation but does report some mild diarrhea.  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  Otherwise she denies any chest pain or shortness of breath or other severe symptoms.  On exam, lungs clear and chest nontender.  Abdomen nontender.  No focal neurologic deficits.  Patient has some rhinorrhea and congestion but otherwise exam is reassuring.  She is not tachycardic and vital signs do not show hypoxia or other acute abnormality.  Patient had swab in triage which did confirm influenza.  COVID was negative.  Given reassuring exam with no evidence of acute pneumonia and reassuring vital signs, I do feel she is safe for discharge home.  We discussed the possibility of Tamiflu and she would like to get a prescription for her to decide if she wants to use it or not.  She will follow-up with PCP and understands return precautions.  She no other questions or concerns and was discharged in good condition.         Final Clinical Impression(s) / ED Diagnoses Final diagnoses:  Influenza A  Acute cough  Upper respiratory tract infection, unspecified type    Rx / DC Orders ED Discharge Orders          Ordered    oseltamivir (TAMIFLU) 75 MG capsule  Every 12 hours         01/20/21 2147            Clinical Impression: 1. Influenza A   2. Acute cough   3. Upper respiratory tract infection, unspecified type     Disposition: Discharge  Condition: Good  I have discussed the results, Dx and Tx plan with the pt(& family if present). He/she/they expressed understanding and agree(s) with the plan. Discharge instructions discussed at great length. Strict return precautions discussed and pt &/or family have verbalized understanding of the instructions. No further questions at time of discharge.    Discharge Medication List as of 01/20/2021  9:48 PM     START taking these medications   Details  oseltamivir (TAMIFLU) 75 MG capsule Take 1 capsule (75 mg total) by mouth every 12 (twelve) hours., Starting Sun 01/20/2021, Print        Follow Up: Shelly Bombard, MD [place] [address] [phone]     [place] [address] [id number] [facility] [place]       Tamerra Merkley, Gwenyth Allegra, MD 01/20/21 [phone]

## 2021-01-20 NOTE — Discharge Instructions (Signed)
Your history, exam, work-up today are consistent with influenza causing your upper respiratory symptoms.  Your exam was reassuring with no evidence of abnormalities and we together agreed to hold on labs or imaging.  Please rest and stay hydrated.  Please consider taking the Tamiflu to help shorten the course of her symptoms.  If any symptoms change or worsen acutely, please return to the nearest emergency department.  Please follow-up with your primary doctor.

## 2021-01-20 NOTE — ED Triage Notes (Signed)
Pt arrives with reports of feeling exertional SOB, fever, chills, and runny nose for a few days states that she was around her sister who was flu positive.

## 2021-02-13 ENCOUNTER — Ambulatory Visit: Payer: PRIVATE HEALTH INSURANCE | Admitting: Obstetrics
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
## 2021-03-11 ENCOUNTER — Emergency Department (HOSPITAL_BASED_OUTPATIENT_CLINIC_OR_DEPARTMENT_OTHER): Payer: Medicaid Other

## 2021-03-11 ENCOUNTER — Other Ambulatory Visit: Payer: Self-pay

## 2021-03-11 ENCOUNTER — Encounter (HOSPITAL_BASED_OUTPATIENT_CLINIC_OR_DEPARTMENT_OTHER): Payer: Self-pay | Admitting: *Deleted

## 2021-03-11 ENCOUNTER — Emergency Department (HOSPITAL_BASED_OUTPATIENT_CLINIC_OR_DEPARTMENT_OTHER)
Admission: EM | Admit: 2021-03-11 | Discharge: 2021-03-11 | Disposition: A | Discharge status: Home / Self Care | Payer: Medicaid Other | Attending: Emergency Medicine | Admitting: Emergency Medicine

## 2021-03-11 DIAGNOSIS — R109 Unspecified abdominal pain: Secondary | ICD-10-CM

## 2021-03-11 DIAGNOSIS — Z79899 Other long term (current) drug therapy: Secondary | ICD-10-CM | POA: Diagnosis not present

## 2021-03-11 DIAGNOSIS — R1031 Right lower quadrant pain: Secondary | ICD-10-CM | POA: Diagnosis not present

## 2021-03-11 DIAGNOSIS — I10 Essential (primary) hypertension: Secondary | ICD-10-CM | POA: Diagnosis not present

## 2021-03-11 LAB — CBC
HCT: 26.3 % — ABNORMAL LOW (ref 36.0–46.0)
Hemoglobin: 7.5 g/dL — ABNORMAL LOW (ref 12.0–15.0)
MCH: 19.3 pg — ABNORMAL LOW (ref 26.0–34.0)
MCHC: 28.5 g/dL — ABNORMAL LOW (ref 30.0–36.0)
MCV: 67.8 fL — ABNORMAL LOW (ref 80.0–100.0)
Platelets: 405 10*3/uL — ABNORMAL HIGH (ref 150–400)
RBC: 3.88 MIL/uL (ref 3.87–5.11)
RDW: 18.9 % — ABNORMAL HIGH (ref 11.5–15.5)
WBC: 7.2 10*3/uL (ref 4.0–10.5)
nRBC: 0 % (ref 0.0–0.2)

## 2021-03-11 LAB — URINALYSIS, ROUTINE W REFLEX MICROSCOPIC
Bilirubin Urine: NEGATIVE
Glucose, UA: NEGATIVE mg/dL
Hgb urine dipstick: NEGATIVE
Ketones, ur: 15 mg/dL — AB
Leukocytes,Ua: NEGATIVE
Nitrite: NEGATIVE
Protein, ur: NEGATIVE mg/dL
Specific Gravity, Urine: >1.03 — ABNORMAL HIGH (ref 1.005–1.030)
pH: 5.5 (ref 5.0–8.0)

## 2021-03-11 LAB — COMPREHENSIVE METABOLIC PANEL
ALT: 14 U/L (ref 0–44)
AST: 20 U/L (ref 15–41)
Albumin: 3.9 g/dL (ref 3.5–5.0)
Alkaline Phosphatase: 83 U/L (ref 38–126)
Anion gap: 9 (ref 5–15)
BUN: 7 mg/dL (ref 6–20)
CO2: 23 mmol/L (ref 22–32)
Calcium: 8.8 mg/dL — ABNORMAL LOW (ref 8.9–10.3)
Chloride: 105 mmol/L (ref 98–111)
Creatinine, Ser: 0.97 mg/dL (ref 0.44–1.00)
GFR, Estimated: >60 mL/min (ref >60)
Glucose, Bld: 126 mg/dL — ABNORMAL HIGH (ref 70–99)
Potassium: 3.6 mmol/L (ref 3.5–5.1)
Sodium: 137 mmol/L (ref 135–145)
Total Bilirubin: 0.5 mg/dL (ref 0.3–1.2)
Total Protein: 7.4 g/dL (ref 6.5–8.1)

## 2021-03-11 LAB — LIPASE, BLOOD: Lipase: 35 U/L (ref 11–51)

## 2021-03-11 LAB — PREGNANCY, URINE: Preg Test, Ur: NEGATIVE

## 2021-03-11 MED ORDER — ACETAMINOPHEN 325 MG PO TABS
650.0000 mg | ORAL_TABLET | Freq: Once | ORAL | Status: AC
  Administered 2021-03-11: 650 mg via ORAL
  Filled 2021-03-11: qty 2

## 2021-03-11 MED ORDER — DICYCLOMINE HCL 20 MG PO TABS: 20.0000 mg | ORAL_TABLET | Freq: Two times a day (BID) | ORAL | 0 refills | Status: AC

## 2021-03-11 MED ORDER — POLYETHYLENE GLYCOL 3350 17 GM/SCOOP PO POWD: 1.0000 | Freq: Once | ORAL | 0 refills | Status: AC

## 2021-03-11 MED ORDER — TRAMADOL HCL 50 MG PO TABS
50.0000 mg | ORAL_TABLET | Freq: Once | ORAL | Status: AC
  Administered 2021-03-11: 50 mg via ORAL
  Filled 2021-03-11: qty 1

## 2021-03-11 NOTE — Discharge Instructions (Signed)
You have been seen and discharged from the emergency department.  Your blood work today was normal, your ultrasound showed a small amount of fluid in the pelvis but was otherwise normal.  I believe he may be suffering from inflammatory bowel like symptoms.  You need  to follow-up with gastroenterology, a referral has been placed, they may recommend scope in further additional testing and more specialized medication.  Otherwise take the medication that has been prescribed to ensure regular BM and avoid constipation.  Follow-up with your primary provider for further evaluation and further care. Take home medications as prescribed. If you have any worsening symptoms or further concerns for your health please return to an emergency department for further evaluation. ?

## 2021-03-11 NOTE — ED Notes (Signed)
Pt presents with RLQ Abd pain for 4x days. Denies n/v/d. States pain is worse with movement and tender. Hx endometriosis.  ?

## 2021-03-11 NOTE — ED Triage Notes (Signed)
RMQ pain x 4 days. Headache. She has been taking Ibuprofen with relief. Hx of endometriosis.  ?

## 2021-03-11 NOTE — ED Notes (Signed)
Pt AAOx4, NAD, Respirations even and unlabored. VS stable. Pt denies any questions following d/c instruction. Gait steady. Walked to front of ED for departure. ?

## 2021-03-11 NOTE — ED Provider Notes (Signed)
?[facility] ?Provider Note ? ? ?CSN: [id number] ?Arrival date & time: 03/11/21  1318 ? ?  ? ?History ? ?Chief Complaint  ?Patient presents with  ? Abdominal Pain  ? ? ?Shirley [name] is a 36 y.o. female. ? ?HPI ? ?36 year old female with past medical history of HTN, endometriosis, ovarian cyst presents emergency department right lower quadrant abdominal pain.  Patient states this has been going on intermittently for the past 4 days.  Today became more severe, right lower quadrant, not relieved with ibuprofen.  She did have constipation last week, took an over-the-counter stool softener with mild relief of symptoms.  Denies any diarrhea, bloody diarrhea, dysuria, hematuria, vaginal symptoms.  No fever.  Does not believe that she could be pregnant.  She has had similar pain in the past with endometriosis/fibroids but states this is more severe. ? ?Home Medications ?Prior to Admission medications   ?Medication Sig Start Date End Date Taking? Authorizing Provider  ?carvedilol (COREG) 12.5 MG tablet Take 1 tablet (12.5 mg total) by mouth 2 (two) times daily with a meal. 10/11/20   Cillian Gwinner, Alvin Critchley, DO  ?cyclobenzaprine (FLEXERIL) 5 MG tablet Take 2 tablets (10 mg total) by mouth 2 (two) times daily as needed for muscle spasms. 10/11/20   Baxter Gonzalez, Alvin Critchley, DO  ?docusate sodium (COLACE) 100 MG capsule Take 1 capsule (100 mg total) by mouth daily. 02/28/19   Little, Wenda Overland, MD  ?ferrous sulfate (FERROUSUL) 325 (65 FE) MG tablet Take 1 tablet (325 mg total) by mouth 2 (two) times daily. 08/20/18   Jorje Guild, NP  ?ferrous sulfate 325 (65 FE) MG tablet Take 1 tablet (325 mg total) by mouth daily. 02/28/19   Little, Wenda Overland, MD  ?ibuprofen (ADVIL) 800 MG tablet Take 1 tablet (800 mg total) by mouth 3 (three) times daily. 11/01/18   Isla Pence, MD  ?methocarbamol (ROBAXIN) 500 MG tablet Take 1 tablet (500 mg total) by mouth 2 (two) times daily. 12/05/19   Khatri, Hina, PA-C   ?metroNIDAZOLE (FLAGYL) 500 MG tablet Take 1 tablet (500 mg total) by mouth 2 (two) times daily. 12/12/20   Woodroe Mode, MD  ?oseltamivir (TAMIFLU) 75 MG capsule Take 1 capsule (75 mg total) by mouth every 12 (twelve) hours. 01/20/21   Tegeler, Gwenyth Allegra, MD  ?oxyCODONE-acetaminophen (PERCOCET/ROXICET) 5-325 MG tablet Take 2 tablets by mouth every 4 (four) hours as needed for severe pain. 03/07/20   Gavin Pound, CNM  ?polyethylene glycol powder (GLYCOLAX/MIRALAX) 17 GM/SCOOP powder Mix 1 capful in drink and take by mouth one to three times daily as needed for daily soft stools ? ?OTC 02/28/19   Little, Wenda Overland, MD  ?senna (SENOKOT) 8.6 MG TABS tablet Take 1 tablet (8.6 mg total) by mouth at bedtime as needed for moderate constipation. Until having soft daily stools 02/28/19   Little, Wenda Overland, MD  ?terconazole (TERAZOL 7) 0.4 % vaginal cream Place 1 applicator vaginally at bedtime. 05/14/20   Shelly Bombard, MD  ?triamterene-hydrochlorothiazide (DYAZIDE) 37.5-25 MG capsule Take 1 each (1 capsule total) by mouth daily before breakfast. ?Patient not taking: No sig reported 03/01/19   Shelly Bombard, MD  ?   ? ?Allergies    ?Patient has no known allergies.   ? ?Review of Systems   ?Review of Systems  ?Constitutional:  Negative for fever.  ?Respiratory:  Negative for shortness of breath.   ?Cardiovascular:  Negative for chest pain.  ?Gastrointestinal:  Positive for abdominal pain.  Negative for blood in stool, diarrhea, nausea and vomiting.  ?Genitourinary:  Positive for pelvic pain. Negative for difficulty urinating, dysuria, flank pain, vaginal bleeding and vaginal pain.  ?Skin:  Negative for rash.  ?Neurological:  Negative for headaches.  ? ?Physical Exam ?Updated Vital Signs ?BP (!) 145/94   Pulse 100   Temp 98.7 ?F (37.1 ?C) (Oral)   Resp 17   Ht '5\' 1"'$  (1.549 m)   Wt 81.6 kg   LMP 02/28/2021   SpO2 100%   BMI 33.99 kg/m?  ?Physical Exam ?Vitals and nursing note reviewed.  ?Constitutional:    ?   General: She is not in acute distress. ?   Appearance: Normal appearance.  ?HENT:  ?   Head: Normocephalic.  ?   Mouth/Throat:  ?   Mouth: Mucous membranes are moist.  ?Cardiovascular:  ?   Rate and Rhythm: Normal rate.  ?Pulmonary:  ?   Effort: Pulmonary effort is normal. No respiratory distress.  ?Abdominal:  ?   General: Bowel sounds are normal.  ?   Palpations: Abdomen is soft.  ?   Tenderness: There is abdominal tenderness in the right lower quadrant. There is no guarding or rebound. Negative signs include McBurney's sign.  ?   Hernia: No hernia is present.  ?Skin: ?   General: Skin is warm.  ?Neurological:  ?   Mental Status: She is alert and oriented to person, place, and time. Mental status is at baseline.  ?Psychiatric:     ?   Mood and Affect: Mood normal.  ? ? ?ED Results / Procedures / Treatments   ?Labs ?(all labs ordered are listed, but only abnormal results are displayed) ?Labs Reviewed  ?COMPREHENSIVE METABOLIC PANEL - Abnormal; Notable for the following components:  ?    Result Value  ? Glucose, Bld 126 (*)   ? Calcium 8.8 (*)   ? All other components within normal limits  ?CBC - Abnormal; Notable for the following components:  ? Hemoglobin 7.5 (*)   ? HCT 26.3 (*)   ? MCV 67.8 (*)   ? MCH 19.3 (*)   ? MCHC 28.5 (*)   ? RDW 18.9 (*)   ? Platelets 405 (*)   ? All other components within normal limits  ?URINALYSIS, ROUTINE W REFLEX MICROSCOPIC - Abnormal; Notable for the following components:  ? Specific Gravity, Urine >1.030 (*)   ? Ketones, ur 15 (*)   ? All other components within normal limits  ?LIPASE, BLOOD  ?PREGNANCY, URINE  ? ? ?EKG ?None ? ?Radiology ?No results found. ? ?Procedures ?Procedures  ? ? ?Medications Ordered in ED ?Medications  ?acetaminophen (TYLENOL) tablet 650 mg (650 mg Oral Given 03/11/21 1343)  ?traMADol (ULTRAM) tablet 50 mg (50 mg Oral Given 03/11/21 1538)  ? ? ?ED Course/ Medical Decision Making/ A&P ?  ?                        ?Medical Decision Making ?Amount and/or  Complexity of Data Reviewed ?Labs: ordered. ?Radiology: ordered. ?ECG/medicine tests: ordered. ? ?Risk ?OTC drugs. ?Prescription drug management. ? ? ?36 year old female presents emergency room with right lower quadrant abdominal pain, intermittent for the past 4 days.  Had an episode of constipation, pain was resolved after having a bowel movement.  Pain is sometimes worse with movement.  History of ovarian cyst, fibroid and endometriosis.  Abdomen is benign on exam. ? ?Vitals are normal on arrival.  Blood work is normal,  pregnancy test is negative, patient does not have any nausea/vomiting.  Lower suspicion for acute abdominal pathology given benign abdominal exam and normal blood work, do not feel emergent CT is warranted at this time.  Ultrasound of the pelvis is unremarkable, no torsion or cyst.  After medication here she feels improved.  Patient reveals that she may have a diagnosis of IBS.  I will place an ambulatory referral to GI.  I will treat her symptomatically and also put her on a bowel regimen to avoid any constipation.  Patient at this time appears safe and stable for discharge and close outpatient follow up. Discharge plan and strict return to ED precautions discussed, patient verbalizes understanding and agreement. ? ? ? ? ? ? ? ?Final Clinical Impression(s) / ED Diagnoses ?Final diagnoses:  ?None  ? ? ?Rx / DC Orders ?ED Discharge Orders   ? ? None  ? ?  ? ? ?  ?Lorelle Gibbs, DO ?03/11/21 1908 ? ?

## 2021-03-15 ENCOUNTER — Encounter: Payer: Self-pay | Admitting: Physician Assistant

## 2021-03-28 ENCOUNTER — Ambulatory Visit: Payer: PRIVATE HEALTH INSURANCE | Admitting: Physician Assistant

## 2022-06-16 ENCOUNTER — Ambulatory Visit: Payer: PRIVATE HEALTH INSURANCE | Admitting: Obstetrics

## 2022-06-16 DIAGNOSIS — Z01419 Encounter for gynecological examination (general) (routine) without abnormal findings: Secondary | ICD-10-CM

## 2022-06-21 NOTE — Progress Notes (Signed)
Patient rescheduled.  Brock Bad, MD 06/21/2022 2:43 PM

## 2022-10-22 ENCOUNTER — Ambulatory Visit: Payer: PRIVATE HEALTH INSURANCE

## 2023-07-08 ENCOUNTER — Inpatient Hospital Stay (HOSPITAL_COMMUNITY)

## 2023-07-08 ENCOUNTER — Inpatient Hospital Stay (HOSPITAL_COMMUNITY)
Admission: AD | Admit: 2023-07-08 | Discharge: 2023-07-08 | Disposition: A | Discharge status: Home / Self Care | Attending: Obstetrics and Gynecology | Admitting: Obstetrics and Gynecology

## 2023-07-08 ENCOUNTER — Encounter (HOSPITAL_COMMUNITY): Payer: Self-pay | Admitting: Obstetrics and Gynecology

## 2023-07-08 DIAGNOSIS — Z3A01 Less than 8 weeks gestation of pregnancy: Secondary | ICD-10-CM | POA: Diagnosis not present

## 2023-07-08 DIAGNOSIS — O209 Hemorrhage in early pregnancy, unspecified: Secondary | ICD-10-CM | POA: Diagnosis not present

## 2023-07-08 DIAGNOSIS — O3680X0 Pregnancy with inconclusive fetal viability, not applicable or unspecified: Secondary | ICD-10-CM | POA: Diagnosis not present

## 2023-07-08 DIAGNOSIS — O09521 Supervision of elderly multigravida, first trimester: Secondary | ICD-10-CM | POA: Diagnosis not present

## 2023-07-08 DIAGNOSIS — R102 Pelvic and perineal pain: Secondary | ICD-10-CM | POA: Insufficient documentation

## 2023-07-08 DIAGNOSIS — O26891 Other specified pregnancy related conditions, first trimester: Secondary | ICD-10-CM | POA: Diagnosis not present

## 2023-07-08 DIAGNOSIS — O200 Threatened abortion: Secondary | ICD-10-CM | POA: Insufficient documentation

## 2023-07-08 LAB — WET PREP, GENITAL
Clue Cells Wet Prep HPF POC: NONE SEEN
Sperm: NONE SEEN
Trich, Wet Prep: NONE SEEN
WBC, Wet Prep HPF POC: >=10 — AB (ref <10)
Yeast Wet Prep HPF POC: NONE SEEN

## 2023-07-08 LAB — URINALYSIS, ROUTINE W REFLEX MICROSCOPIC
Bilirubin Urine: NEGATIVE
Glucose, UA: NEGATIVE mg/dL
Ketones, ur: NEGATIVE mg/dL
Nitrite: NEGATIVE
Protein, ur: 100 mg/dL — AB
RBC / HPF: >50 RBC/hpf (ref 0–5)
Specific Gravity, Urine: 1.021 (ref 1.005–1.030)
WBC, UA: >50 WBC/hpf (ref 0–5)
pH: 5 (ref 5.0–8.0)

## 2023-07-08 LAB — CBC
HCT: 28.5 % — ABNORMAL LOW (ref 36.0–46.0)
Hemoglobin: 8.4 g/dL — ABNORMAL LOW (ref 12.0–15.0)
MCH: 21.6 pg — ABNORMAL LOW (ref 26.0–34.0)
MCHC: 29.5 g/dL — ABNORMAL LOW (ref 30.0–36.0)
MCV: 73.3 fL — ABNORMAL LOW (ref 80.0–100.0)
Platelets: 292 10*3/uL (ref 150–400)
RBC: 3.89 MIL/uL (ref 3.87–5.11)
RDW: 22.7 % — ABNORMAL HIGH (ref 11.5–15.5)
WBC: 9.7 10*3/uL (ref 4.0–10.5)
nRBC: 0 % (ref 0.0–0.2)

## 2023-07-08 LAB — POCT PREGNANCY, URINE: Preg Test, Ur: POSITIVE — AB

## 2023-07-08 LAB — HIV ANTIBODY (ROUTINE TESTING W REFLEX): HIV Screen 4th Generation wRfx: NONREACTIVE

## 2023-07-08 LAB — HCG, QUANTITATIVE, PREGNANCY: hCG, Beta Chain, Quant, S: 1107 m[IU]/mL — ABNORMAL HIGH (ref <5)

## 2023-07-08 MED ORDER — OXYCODONE-ACETAMINOPHEN 5-325 MG PO TABS: 1.0000 | ORAL_TABLET | Freq: Four times a day (QID) | ORAL | 0 refills | Status: AC | PRN

## 2023-07-08 MED ORDER — IBUPROFEN 600 MG PO TABS: 600.0000 mg | ORAL_TABLET | Freq: Four times a day (QID) | ORAL | 1 refills | Status: AC | PRN

## 2023-07-08 NOTE — MAU Provider Note (Incomplete)
 Chief Complaint: Abdominal Pain and Vaginal Bleeding   Event Date/Time   First Provider Initiated Contact with Patient 07/08/23 2222        SUBJECTIVE HPI: Shirley [name] is a 38 y.o. [id number] at [redacted]w[redacted]d by LMP who presents to maternity admissions reporting vaginal bleeding since Sunday with passage of tissue yesterday.  States it appeared to have a sac in it.  Still having some cramping and bleeding. . She denies urinary symptoms, dizziness, n/v, or fever/chills.     Abdominal Pain This is a new problem. The current episode started in the past 7 days. The pain is located in the suprapubic region, LLQ and RLQ. The quality of the pain is cramping. Pertinent negatives include no diarrhea, dysuria or fever. Nothing aggravates the pain. The pain is relieved by Nothing.  Vaginal Bleeding The patient's primary symptoms include pelvic pain and vaginal bleeding. The current episode started in the past 7 days. She is pregnant. Associated symptoms include abdominal pain. Pertinent negatives include no diarrhea, dysuria or fever. The vaginal bleeding is heavier than menses. She has been passing clots. She has been passing tissue.   RN Note: Shirley [name] is a 38 y.o. at Unknown here in MAU reporting: Started having vaginal bleeding on Sunday, believes that yesterday she passed the tissue. Today continues to have bleeding and cramping. The bleeding is light on her pad. Her cramping is still an 8/10 and she took  tylenol  and ibuprofen  around 10am but neither helped.  Was not seen for this pregnancy yet.  LMP: 05/20/2023 Pain score: 8/10  Past Medical History:  Diagnosis Date   Eczema    Hypertension    Infection    UTI   Low serum iron    Ovarian cyst    Past Surgical History:  Procedure Laterality Date   CESAREAN SECTION     INDUCED ABORTION     WISDOM TOOTH EXTRACTION     Social History   Socioeconomic History   Marital status: Single    Spouse name: Not on file   Number of children: Not  on file   Years of education: Not on file   Highest education level: Not on file  Occupational History   Not on file  Tobacco Use   Smoking status: Every Day    Current packs/day: 0.50    Average packs/day: 0.5 packs/day for 2.0 years (1.0 ttl pk-yrs)    Types: Cigarettes   Smokeless tobacco: Never   Tobacco comments:    quit 2019  Vaping Use   Vaping status: Never Used  Substance and Sexual Activity   Alcohol use: No   Drug use: No   Sexual activity: Yes    Partners: Male    Birth control/protection: None  Other Topics Concern   Not on file  Social History Narrative   Not on file   Social Drivers of Health   Financial Resource Strain: Not on file  Food Insecurity: Not on file  Transportation Needs: Not on file  Physical Activity: Not on file  Stress: Not on file  Social Connections: Not on file  Intimate Partner Violence: Not on file   No current facility-administered medications on file prior to encounter.   Current Outpatient Medications on File Prior to Encounter  Medication Sig Dispense Refill   carvedilol  (COREG ) 12.5 MG tablet Take 1 tablet (12.5 mg total) by mouth 2 (two) times daily with a meal. 60 tablet 0   cyclobenzaprine  (FLEXERIL ) 5 MG tablet Take 2  tablets (10 mg total) by mouth 2 (two) times daily as needed for muscle spasms. 10 tablet 0   dicyclomine  (BENTYL ) 20 MG tablet Take 1 tablet (20 mg total) by mouth 2 (two) times daily for 5 days. 10 tablet 0   docusate sodium  (COLACE) 100 MG capsule Take 1 capsule (100 mg total) by mouth daily. 30 capsule 0   ferrous sulfate  (FERROUSUL) 325 (65 FE) MG tablet Take 1 tablet (325 mg total) by mouth 2 (two) times daily. 60 tablet 0   ferrous sulfate  325 (65 FE) MG tablet Take 1 tablet (325 mg total) by mouth daily. 30 tablet 0   ibuprofen  (ADVIL ) 800 MG tablet Take 1 tablet (800 mg total) by mouth 3 (three) times daily. 21 tablet 0   methocarbamol  (ROBAXIN ) 500 MG tablet Take 1 tablet (500 mg total) by mouth 2  (two) times daily. 20 tablet 0   metroNIDAZOLE  (FLAGYL ) 500 MG tablet Take 1 tablet (500 mg total) by mouth 2 (two) times daily. 14 tablet 0   oseltamivir  (TAMIFLU ) 75 MG capsule Take 1 capsule (75 mg total) by mouth every 12 (twelve) hours. 10 capsule 0   oxyCODONE -acetaminophen  (PERCOCET/ROXICET) 5-325 MG tablet Take 2 tablets by mouth every 4 (four) hours as needed for severe pain. 6 tablet 0   senna (SENOKOT) 8.6 MG TABS tablet Take 1 tablet (8.6 mg total) by mouth at bedtime as needed for moderate constipation. Until having soft daily stools 10 tablet 0   terconazole  (TERAZOL 7 ) 0.4 % vaginal cream Place 1 applicator vaginally at bedtime. 45 g 0   triamterene -hydrochlorothiazide (DYAZIDE) 37.5-25 MG capsule Take 1 each (1 capsule total) by mouth daily before breakfast. (Patient not taking: No sig reported) 30 capsule 11   No Known Allergies  I have reviewed patient's Past Medical Hx, Surgical Hx, Family Hx, Social Hx, medications and allergies.   ROS:  Review of Systems  Constitutional:  Negative for fever.  Gastrointestinal:  Positive for abdominal pain. Negative for diarrhea.  Genitourinary:  Positive for pelvic pain and vaginal bleeding. Negative for dysuria.   Review of Systems  Other systems negative   Physical Exam  Physical Exam Patient Vitals for the past 24 hrs:  BP Temp Temp src Pulse Resp SpO2 Height Weight  07/08/23 1950 139/83 98.8 F (37.1 C) Oral 76 16 100 % 5' 1 (1.549 m) 86.6 kg   Constitutional: Well-developed, well-nourished female in no acute distress.  Cardiovascular: normal rate Respiratory: normal effort GI: Abd soft, non-tender.  MS: Extremities nontender, no edema, normal ROM Neurologic: Alert and oriented x 4.   LAB RESULTS Results for orders placed or performed during the hospital encounter of 07/08/23 (from the past 24 hours)  Pregnancy, urine POC     Status: Abnormal   Collection Time: 07/08/23  8:00 PM  Result Value Ref Range   Preg Test,  Ur POSITIVE (A) NEGATIVE  Urinalysis, Routine w reflex microscopic -Urine, Clean Catch     Status: Abnormal   Collection Time: 07/08/23  8:07 PM  Result Value Ref Range   Color, Urine YELLOW YELLOW   APPearance CLOUDY (A) CLEAR   Specific Gravity, Urine 1.021 1.005 - 1.030   pH 5.0 5.0 - 8.0   Glucose, UA NEGATIVE NEGATIVE mg/dL   Hgb urine dipstick LARGE (A) NEGATIVE   Bilirubin Urine NEGATIVE NEGATIVE   Ketones, ur NEGATIVE NEGATIVE mg/dL   Protein, ur 899 (A) NEGATIVE mg/dL   Nitrite NEGATIVE NEGATIVE   Leukocytes,Ua LARGE (A) NEGATIVE  RBC / HPF >50 0 - 5 RBC/hpf   WBC, UA >50 0 - 5 WBC/hpf   Bacteria, UA RARE (A) NONE SEEN   Squamous Epithelial / HPF 6-10 0 - 5 /HPF   Mucus PRESENT   Wet prep, genital     Status: Abnormal   Collection Time: 07/08/23  8:56 PM  Result Value Ref Range   Yeast Wet Prep HPF POC NONE SEEN NONE SEEN   Trich, Wet Prep NONE SEEN NONE SEEN   Clue Cells Wet Prep HPF POC NONE SEEN NONE SEEN   WBC, Wet Prep HPF POC >=10 (A) <10   Sperm NONE SEEN   CBC     Status: Abnormal   Collection Time: 07/08/23  9:11 PM  Result Value Ref Range   WBC 9.7 4.0 - 10.5 K/uL   RBC 3.89 3.87 - 5.11 MIL/uL   Hemoglobin 8.4 (L) 12.0 - 15.0 g/dL   HCT 71.4 (L) 63.9 - 53.9 %   MCV 73.3 (L) 80.0 - 100.0 fL   MCH 21.6 (L) 26.0 - 34.0 pg   MCHC 29.5 (L) 30.0 - 36.0 g/dL   RDW 77.2 (H) 88.4 - 84.4 %   Platelets 292 150 - 400 K/uL   nRBC 0.0 0.0 - 0.2 %  hCG, quantitative, pregnancy     Status: Abnormal   Collection Time: 07/08/23  9:11 PM  Result Value Ref Range   hCG, Beta Chain, Quant, S 1,107 (H) <5 mIU/mL     IMAGING US  OB LESS THAN 14 WEEKS WITH OB TRANSVAGINAL Result Date: 07/08/2023 CLINICAL DATA:  [id number] Vaginal bleeding in pregnancy, first trimester 181855. Gestational age by last menstrual period 7 weeks and 0 days. Estimated due date by last menstrual period 02/24/2024 EXAM: OBSTETRIC <14 WK US  AND TRANSVAGINAL OB US  TECHNIQUE: Both transabdominal and  transvaginal ultrasound examinations were performed for complete evaluation of the gestation as well as the maternal uterus, adnexal regions, and pelvic cul-de-sac. Transvaginal technique was performed to assess early pregnancy. COMPARISON:  None Available. FINDINGS: Intrauterine gestational sac: None Yolk sac:  Not Visualized. Embryo:  Not Visualized. Cardiac Activity: Not Visualized. Maternal uterus/adnexae: Right ovary not well visualized. Left ovary unremarkable. Heterogeneous thickened (16 mm) endometrium with associated vascular flow. IMPRESSION: Heterogeneous thickened (16 mm) endometrium with associated vascular flow is suggestive of possible spontaneous abortion. Non-localization of the pregnancy on this scan. No intrauterine gestational sac. No abnormal ovarian or adnexal masses. Differential diagnosis includes intrauterine gestation too early to visualize, spontaneous abortion, or occult ectopic gestation. Recommend close clinical follow-up and serial serum beta-HCG monitoring, with repeat obstetric scan as warranted by beta-HCG levels and clinical assessment. Electronically Signed   By: Morgane  Naveau M.D.   On: 07/08/2023 21:53    MAU Management/MDM: I have reviewed the triage vital signs and the nursing notes.   Pertinent labs & imaging results that were available during my care of the patient were reviewed by me and considered in my medical decision making (see chart for details).      I have reviewed her medical records including past results, notes and treatments. Medical, Surgical, and family history were reviewed.  Medications and recent lab tests were reviewed  Ordered usual first trimester r/o ectopic labs.   Pelvic cultures done Will check baseline Ultrasound to rule out ectopic. Has photo of tissue passed yesterday, golfball sized fluid filled sac visible   This bleeding/pain can represent a normal pregnancy with bleeding, spontaneous abortion or even an ectopic which can be  life-threatening.  The process as listed above helps to determine which of these is present.  Discussed findings of HCG and ultrasound, suggest miscarriage, but are not conclusive  Reviewed recommendation to recheck HCG in 48 hours to see if it drops.or rises. If it rises, would repeat US  in a week or two.   ASSESSMENT Pregnancy at [redacted]w[redacted]d by LMP Bleeding and cramping Threatened abortion  PLAN Discharge home Plan to repeat HCG level in 48 hours  Will repeat  Ultrasound in about 7-10 days if HCG levels double appropriately  Ectopic and SAB precautions Rx small #of Percocet and ibuprofen  per pt request for pain.  Pt stable at time of discharge. Encouraged to return here if she develops worsening of symptoms, increase in pain, fever, or other concerning symptoms.    Earnie Pouch CNM, MSN Certified Nurse-Midwife 07/08/2023  10:22 PM

## 2023-07-08 NOTE — MAU Note (Signed)
..  Shirley [name] is a 38 y.o. at Unknown here in MAU reporting: Started having vaginal bleeding on Sunday, believes that yesterday she passed the tissue. Today continues to have bleeding and cramping. The bleeding is light on her pad. Her cramping is still an 8/10 and she took  tylenol  and ibuprofen  around 10am but neither helped.  Was not seen for this pregnancy yet.  LMP: 05/20/2023 Pain score: 8/10 Vitals:   07/08/23 1950  BP: 139/83  Pulse: 76  Resp: 16  Temp: 98.8 F (37.1 C)  SpO2: 100%     FHT:n/a Lab orders placed from triage:  POCT preg

## 2023-07-09 LAB — GC/CHLAMYDIA PROBE AMP (~~LOC~~) NOT AT ARMC
Chlamydia: NEGATIVE
Comment: NEGATIVE
Comment: NORMAL
Neisseria Gonorrhea: NEGATIVE

## 2023-07-09 LAB — RPR: RPR Ser Ql: NONREACTIVE
